# Patient Record
Sex: Female | Born: 1950
Health system: Southern US, Community
[De-identification: ages and names within clinical notes are randomized; demographics above are authoritative.]

## PROBLEM LIST (undated history)

## (undated) DIAGNOSIS — R238 Other skin changes: Secondary | ICD-10-CM

## (undated) DIAGNOSIS — Z5189 Encounter for other specified aftercare: Secondary | ICD-10-CM

## (undated) DIAGNOSIS — M719 Bursopathy, unspecified: Secondary | ICD-10-CM

## (undated) DIAGNOSIS — J029 Acute pharyngitis, unspecified: Secondary | ICD-10-CM

## (undated) DIAGNOSIS — N200 Calculus of kidney: Secondary | ICD-10-CM

## (undated) DIAGNOSIS — D219 Benign neoplasm of connective and other soft tissue, unspecified: Secondary | ICD-10-CM

## (undated) DIAGNOSIS — R233 Spontaneous ecchymoses: Secondary | ICD-10-CM

## (undated) DIAGNOSIS — R21 Rash and other nonspecific skin eruption: Secondary | ICD-10-CM

## (undated) DIAGNOSIS — D649 Anemia, unspecified: Secondary | ICD-10-CM

## (undated) DIAGNOSIS — R5383 Other fatigue: Secondary | ICD-10-CM

## (undated) DIAGNOSIS — IMO0001 Reserved for inherently not codable concepts without codable children: Secondary | ICD-10-CM

## (undated) DIAGNOSIS — IMO0002 Reserved for concepts with insufficient information to code with codable children: Secondary | ICD-10-CM

## (undated) DIAGNOSIS — G243 Spasmodic torticollis: Secondary | ICD-10-CM

## (undated) DIAGNOSIS — D496 Neoplasm of unspecified behavior of brain: Secondary | ICD-10-CM

## (undated) DIAGNOSIS — Z973 Presence of spectacles and contact lenses: Secondary | ICD-10-CM

## (undated) DIAGNOSIS — N83209 Unspecified ovarian cyst, unspecified side: Secondary | ICD-10-CM

## (undated) DIAGNOSIS — K5190 Ulcerative colitis, unspecified, without complications: Secondary | ICD-10-CM

## (undated) DIAGNOSIS — J383 Other diseases of vocal cords: Secondary | ICD-10-CM

## (undated) HISTORY — DX: Anemia, unspecified: D64.9

## (undated) HISTORY — DX: Spasmodic torticollis: G24.3

## (undated) HISTORY — DX: Ulcerative colitis, unspecified, without complications: K51.90

## (undated) HISTORY — DX: Spontaneous ecchymoses: R23.3

## (undated) HISTORY — DX: Calculus of kidney: N20.0

## (undated) HISTORY — DX: Acute pharyngitis, unspecified: J02.9

## (undated) HISTORY — DX: Benign neoplasm of connective and other soft tissue, unspecified: D21.9

## (undated) HISTORY — DX: Other fatigue: R53.83

## (undated) HISTORY — DX: Reserved for inherently not codable concepts without codable children: IMO0001

## (undated) HISTORY — DX: Presence of spectacles and contact lenses: Z97.3

## (undated) HISTORY — DX: Rash and other nonspecific skin eruption: R21

## (undated) HISTORY — DX: Reserved for concepts with insufficient information to code with codable children: IMO0002

## (undated) HISTORY — PX: OVARIAN CYST REMOVAL: SHX89

## (undated) HISTORY — DX: Bursopathy, unspecified: M71.9

## (undated) HISTORY — DX: Other skin changes: R23.8

## (undated) HISTORY — DX: Unspecified ovarian cyst, unspecified side: N83.209

## (undated) HISTORY — DX: Encounter for other specified aftercare: Z51.89

## (undated) HISTORY — DX: Other diseases of vocal cords: J38.3

## (undated) HISTORY — DX: Neoplasm of unspecified behavior of brain: D49.6

---
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
## 1979-11-28 DIAGNOSIS — N83209 Unspecified ovarian cyst, unspecified side: Secondary | ICD-10-CM

## 1979-11-28 HISTORY — DX: Unspecified ovarian cyst, unspecified side: N83.209

## 1980-11-27 DIAGNOSIS — J383 Other diseases of vocal cords: Secondary | ICD-10-CM

## 1980-11-27 DIAGNOSIS — D219 Benign neoplasm of connective and other soft tissue, unspecified: Secondary | ICD-10-CM

## 1980-11-27 HISTORY — PX: ABDOMINAL HYSTERECTOMY: SHX81

## 1980-11-27 HISTORY — DX: Other diseases of vocal cords: J38.3

## 1980-11-27 HISTORY — DX: Benign neoplasm of connective and other soft tissue, unspecified: D21.9

## 1980-11-27 HISTORY — PX: ILEOSTOMY: SHX1783

## 1993-11-27 DIAGNOSIS — D496 Neoplasm of unspecified behavior of brain: Secondary | ICD-10-CM

## 1993-11-27 HISTORY — DX: Neoplasm of unspecified behavior of brain: D49.6

## 1993-11-27 HISTORY — PX: BRAIN TUMOR EXCISION: SHX577

## 1998-11-27 HISTORY — PX: LARYNX SURGERY: SHX692

## 2005-11-27 DIAGNOSIS — G243 Spasmodic torticollis: Secondary | ICD-10-CM

## 2005-11-27 HISTORY — DX: Spasmodic torticollis: G24.3

## 2010-02-16 ENCOUNTER — Encounter: Admission: RE | Admit: 2010-02-16 | Discharge: 2010-02-16 | Payer: Self-pay | Admitting: Family Medicine

## 2010-02-23 ENCOUNTER — Encounter: Admission: RE | Admit: 2010-02-23 | Discharge: 2010-02-23 | Payer: Self-pay | Admitting: Family Medicine

## 2011-01-20 ENCOUNTER — Other Ambulatory Visit: Payer: Self-pay | Admitting: Family Medicine

## 2011-01-20 DIAGNOSIS — E041 Nontoxic single thyroid nodule: Secondary | ICD-10-CM

## 2011-01-24 ENCOUNTER — Ambulatory Visit
Admission: RE | Admit: 2011-01-24 | Discharge: 2011-01-24 | Disposition: A | Discharge status: Home / Self Care | Payer: BC Managed Care – PPO | Source: Ambulatory Visit | Attending: Family Medicine | Admitting: Family Medicine

## 2011-01-24 DIAGNOSIS — E041 Nontoxic single thyroid nodule: Secondary | ICD-10-CM

## 2011-02-01 ENCOUNTER — Other Ambulatory Visit: Payer: Self-pay | Admitting: Family Medicine

## 2011-02-01 DIAGNOSIS — E041 Nontoxic single thyroid nodule: Secondary | ICD-10-CM

## 2011-02-14 ENCOUNTER — Ambulatory Visit
Admission: RE | Admit: 2011-02-14 | Discharge: 2011-02-14 | Disposition: A | Discharge status: Home / Self Care | Payer: BC Managed Care – PPO | Source: Ambulatory Visit | Attending: Family Medicine | Admitting: Family Medicine

## 2011-02-14 ENCOUNTER — Inpatient Hospital Stay
Admission: RE | Admit: 2011-02-14 | Discharge: 2011-02-14 | Payer: BC Managed Care – PPO | Source: Ambulatory Visit | Attending: Family Medicine | Admitting: Family Medicine

## 2011-02-14 ENCOUNTER — Other Ambulatory Visit: Payer: Self-pay | Admitting: Family Medicine

## 2011-02-14 ENCOUNTER — Other Ambulatory Visit (HOSPITAL_COMMUNITY)
Admission: RE | Admit: 2011-02-14 | Discharge: 2011-02-14 | Disposition: A | Discharge status: Home / Self Care | Payer: BC Managed Care – PPO | Source: Ambulatory Visit | Attending: Interventional Radiology | Admitting: Interventional Radiology

## 2011-02-14 ENCOUNTER — Other Ambulatory Visit: Payer: Self-pay | Admitting: Interventional Radiology

## 2011-02-14 DIAGNOSIS — E049 Nontoxic goiter, unspecified: Secondary | ICD-10-CM | POA: Insufficient documentation

## 2011-02-14 DIAGNOSIS — E041 Nontoxic single thyroid nodule: Secondary | ICD-10-CM

## 2011-03-07 IMAGING — US US ABDOMEN COMPLETE
1 series · 14 of 25 positions shown · non-contrast
Comparison: 02/16/2010

CLINICAL DATA: Abnormal right abdominal calcification by plain
radiography concerning for gallstones or renal calculi.

ABDOMINAL ULTRASOUND COMPLETE

[Series 1: us abdomen complete · 0.23mm/px · 14 of 63 slices shown]
[im 1/63]
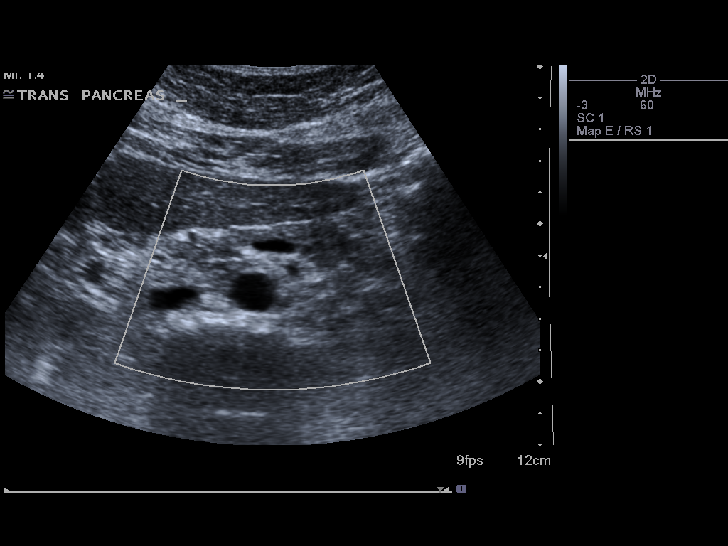
[im 6/63]
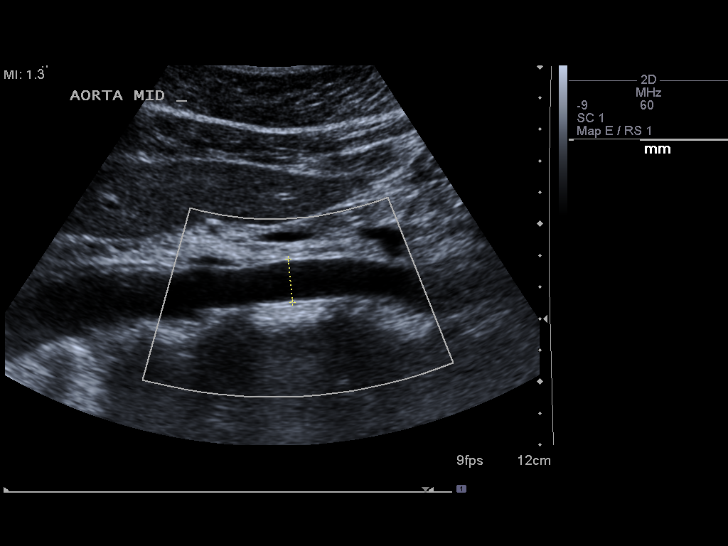
[im 11/63]
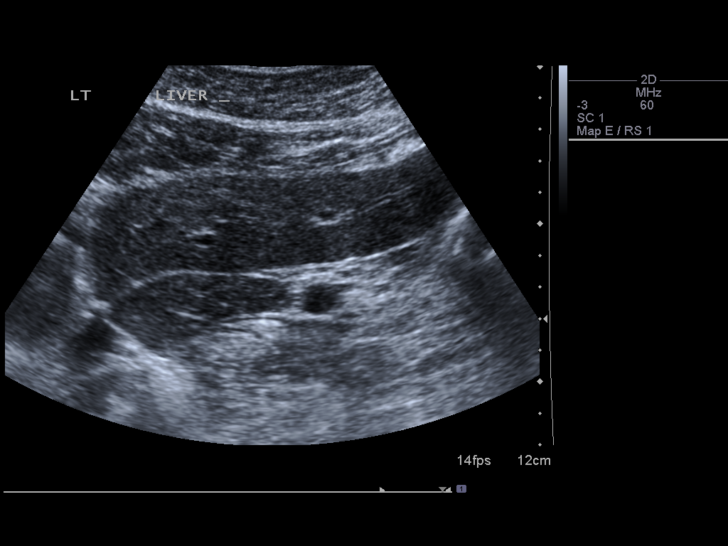
[im 16/63]
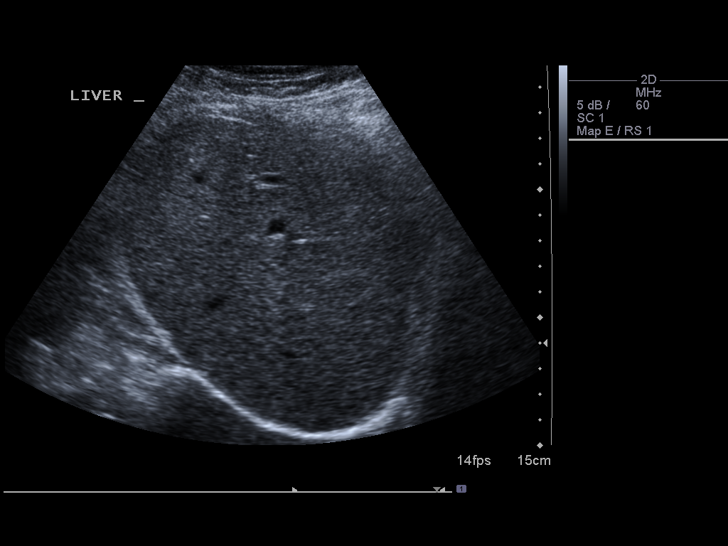
[im 21/63]
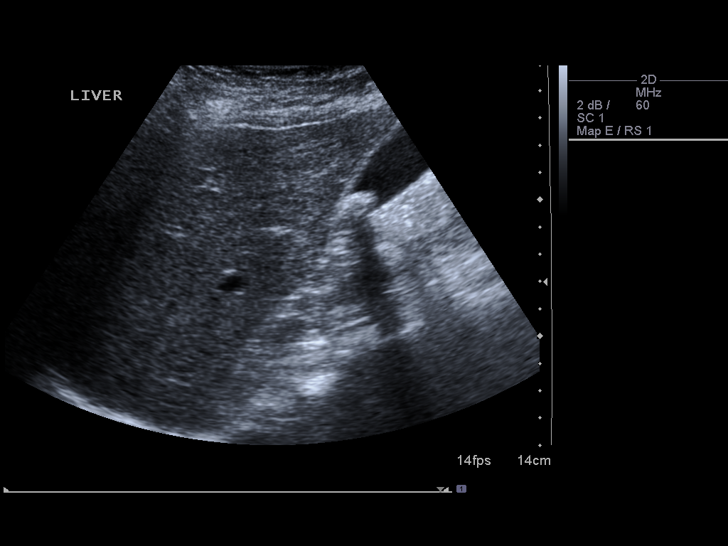
[im 24/63]
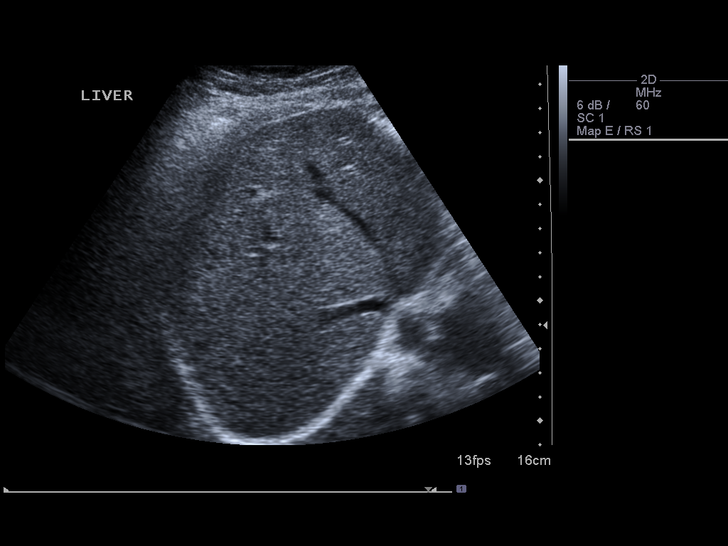
[im 29/63]
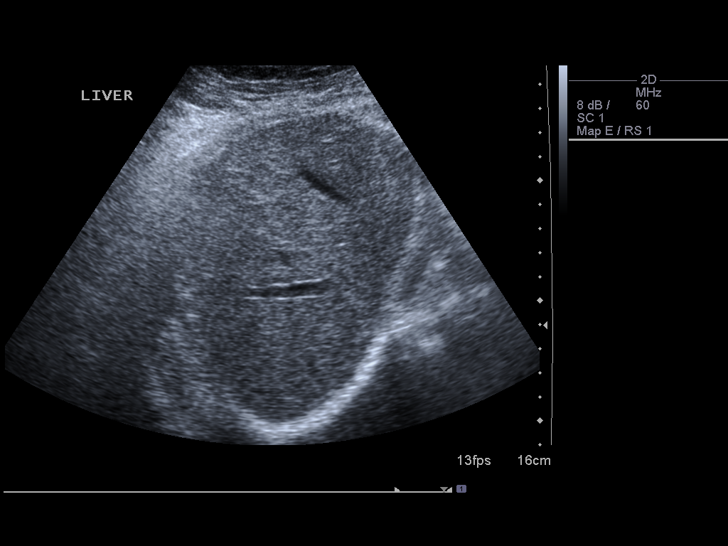
[im 34/63]
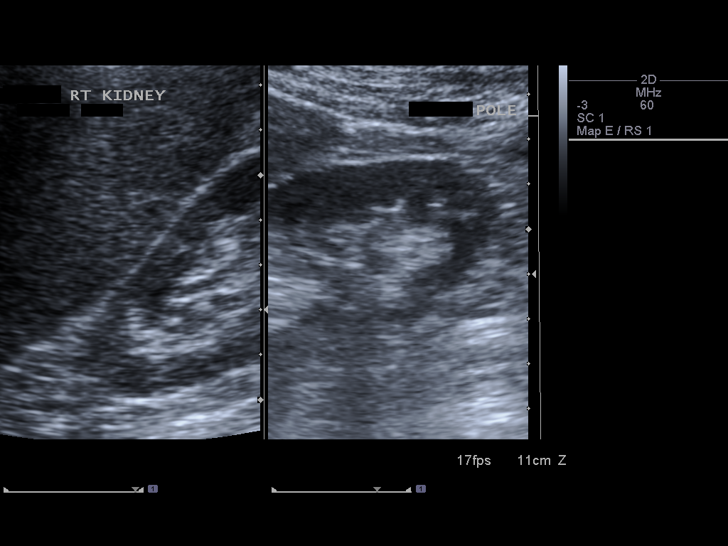
[im 39/63]
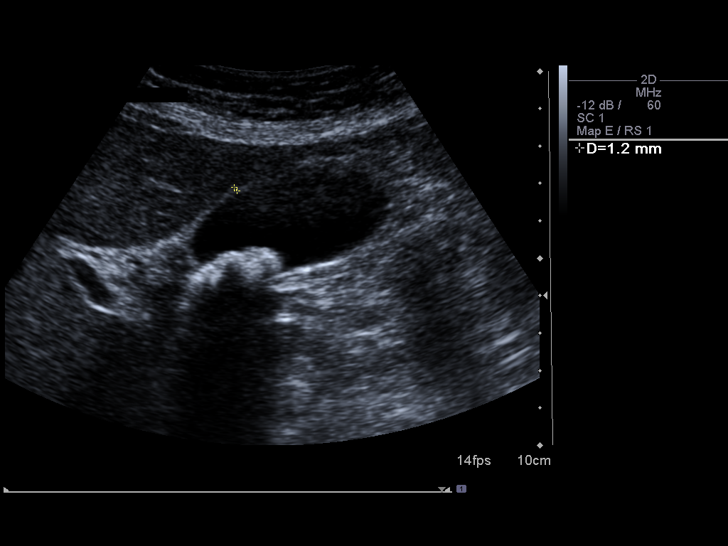
[im 42/63]
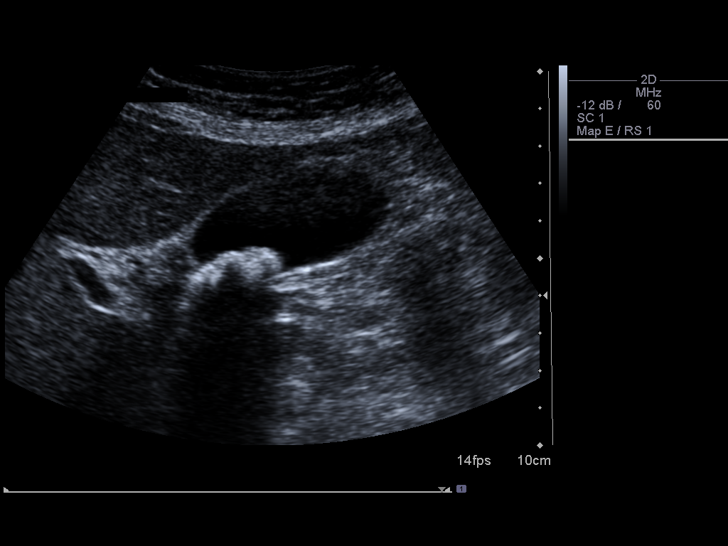
[im 47/63]
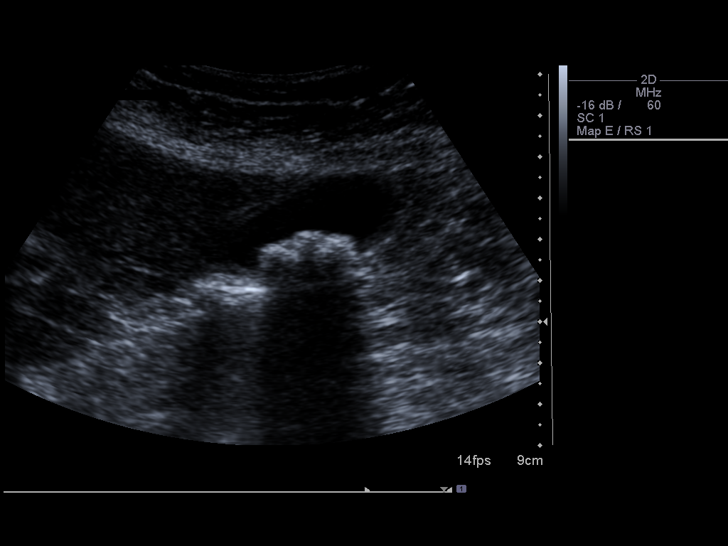
[im 52/63]
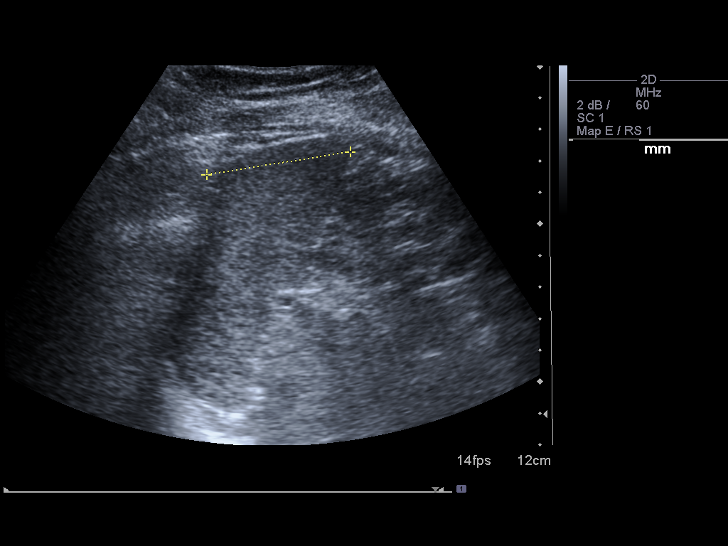
[im 57/63]
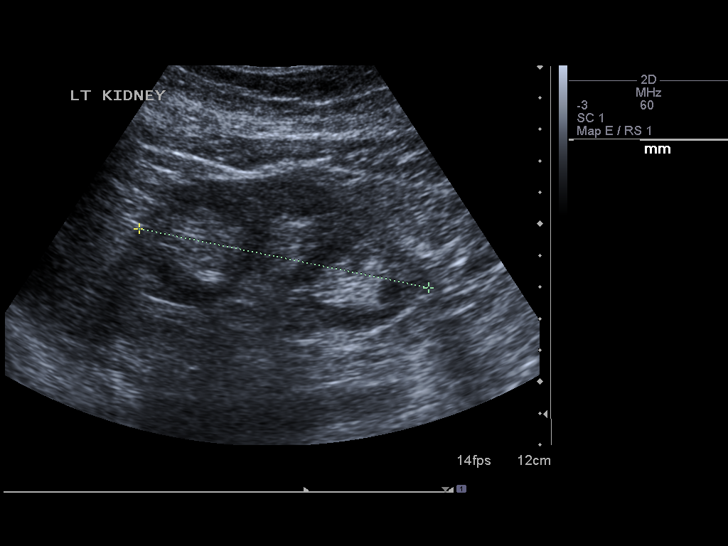
[im 63/63]
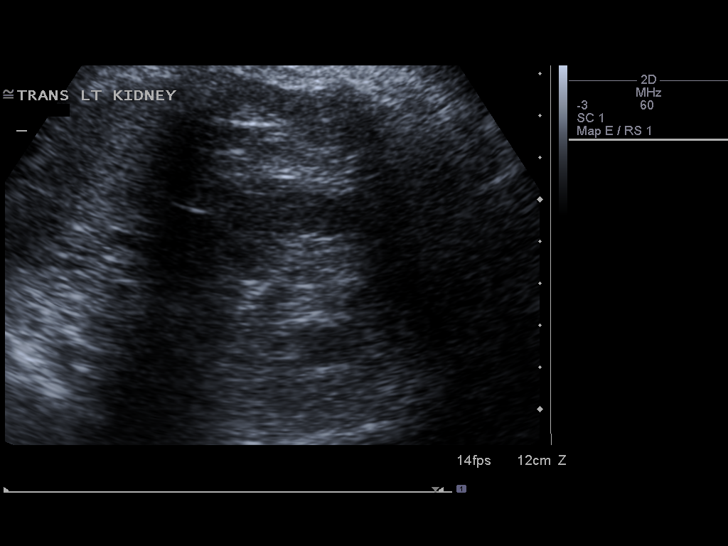

[14 of 25 positions shown; findings below may reference images not displayed]

FINDINGS: Gallbladder:  Several echogenic calcified shadowing gallstones
noted.  Largest measures 2.3 cm.  Gallstones appear mobile.  This
correlates with the calcifications in the right abdomen by plain
radiography.  No wall thickening, pericholecystic fluid, or
Murphy's sign.  Wall thickness measures 1.2 mm.

Common Bile Duct:  Within normal limits in caliber. Nondilated,
mm diameter.

Liver:  No focal lesion identified.  Within normal limits in
parenchymal echogenicity.

IVC:  Appears normal.

Pancreas:  Although the pancreas is difficult to visualize in its
entirety, no focal pancreatic abnormality is identified.

Spleen:  Within normal limits in size and echotexture.

Right kidney:  Normal in size and parenchymal echogenicity.  No
evidence of mass or hydronephrosis.

Left kidney:  Normal in size and parenchymal echogenicity.  No
evidence of mass or hydronephrosis.

Abdominal Aorta:  No aneurysm identified.
IMPRESSION: Cholelithiasis

## 2011-03-15 ENCOUNTER — Other Ambulatory Visit: Payer: Self-pay | Admitting: Surgery

## 2011-03-15 DIAGNOSIS — E041 Nontoxic single thyroid nodule: Secondary | ICD-10-CM

## 2011-03-29 ENCOUNTER — Encounter (INDEPENDENT_AMBULATORY_CARE_PROVIDER_SITE_OTHER): Payer: Self-pay | Admitting: Surgery

## 2011-09-25 ENCOUNTER — Ambulatory Visit
Admission: RE | Admit: 2011-09-25 | Discharge: 2011-09-25 | Disposition: A | Discharge status: Home / Self Care | Payer: BC Managed Care – PPO | Source: Ambulatory Visit | Attending: Surgery | Admitting: Surgery

## 2011-09-25 DIAGNOSIS — E041 Nontoxic single thyroid nodule: Secondary | ICD-10-CM

## 2011-09-27 ENCOUNTER — Telehealth (INDEPENDENT_AMBULATORY_CARE_PROVIDER_SITE_OTHER): Payer: Self-pay

## 2011-09-27 NOTE — Telephone Encounter (Signed)
Patient needs to schedule appointment for physical exam in the office.

## 2011-10-16 ENCOUNTER — Encounter (INDEPENDENT_AMBULATORY_CARE_PROVIDER_SITE_OTHER): Payer: Self-pay | Admitting: Surgery

## 2011-10-17 ENCOUNTER — Ambulatory Visit (INDEPENDENT_AMBULATORY_CARE_PROVIDER_SITE_OTHER): Payer: BC Managed Care – PPO | Admitting: Surgery

## 2011-10-17 ENCOUNTER — Encounter (INDEPENDENT_AMBULATORY_CARE_PROVIDER_SITE_OTHER): Payer: Self-pay | Admitting: Surgery

## 2011-10-17 VITALS — BP 132/76 | HR 70 | Temp 96.8°F | Resp 16 | Ht 61.5 in | Wt 138.0 lb

## 2011-10-17 DIAGNOSIS — E041 Nontoxic single thyroid nodule: Secondary | ICD-10-CM | POA: Insufficient documentation

## 2011-10-17 NOTE — Progress Notes (Signed)
Visit Diagnoses: 1. Thyroid nodule, uninodular, left     HISTORY: Patient is a 60 year old white female followed for left thyroid nodule. This was an incidental finding on MRI scan in the winter of 2012.  Subsequent thyroid ultrasound was performed February 2012. This showed a normal sized thyroid gland with a solitary left thyroid nodule measuring 1.8 cm. Ultrasound-guided fine-needle aspiration biopsy was performed with benign cytopathology. Patient has a normal TSH level of 0.90.  Patient returns today for followup. A repeat ultrasound of the thyroid was performed on September 25, 2011. This shows the thyroid gland to be stable in size. The dominant nodule on the left is stable at 1.8 cm. No significant changes are identified.   PERTINENT REVIEW OF SYSTEMS: Patient notes occasional cough. She has some seasonal allergies. She denies any new masses. She denies any pain.   EXAM: HEENT: normocephalic; pupils equal and reactive; sclerae clear; dentition good; mucous membranes moist NECK:  No palpable thyroid nodules; symmetric on extension; no palpable anterior or posterior cervical lymphadenopathy; no supraclavicular masses; no tenderness CHEST: clear to auscultation bilaterally without rales, rhonchi, or wheezes CARDIAC: regular rate and rhythm without significant murmur; peripheral pulses are full EXT:  non-tender without edema; no deformity NEURO: no gross focal deficits; no sign of tremor   IMPRESSION: Left thyroid nodule, complex, 1.8 cm, with benign cytopathology   PLAN: The patient and I reviewed the recent ultrasound results. I have recommended that she followup in our practice in one year. We will repeat a thyroid ultrasound prior to that office visit. Her primary physician will continue to monitor her TSH level.   Velora Heckler, MD, FACS General & Endocrine Surgery [facility]

## 2011-10-26 ENCOUNTER — Encounter (INDEPENDENT_AMBULATORY_CARE_PROVIDER_SITE_OTHER): Payer: Self-pay | Admitting: Surgery

## 2012-02-05 IMAGING — US US SOFT TISSUE HEAD/NECK
1 series · 14 of 25 positions shown · non-contrast
Comparison: None.

CLINICAL DATA: Thyroid nodule discovered on outside MRI.

THYROID ULTRASOUND
TECHNIQUE: Ultrasound examination of the thyroid gland and adjacent
soft tissues was performed.

[Series 1: us soft tissue head/neck · 0.07mm/px · 14 of 37 slices shown]
[im 1/37]
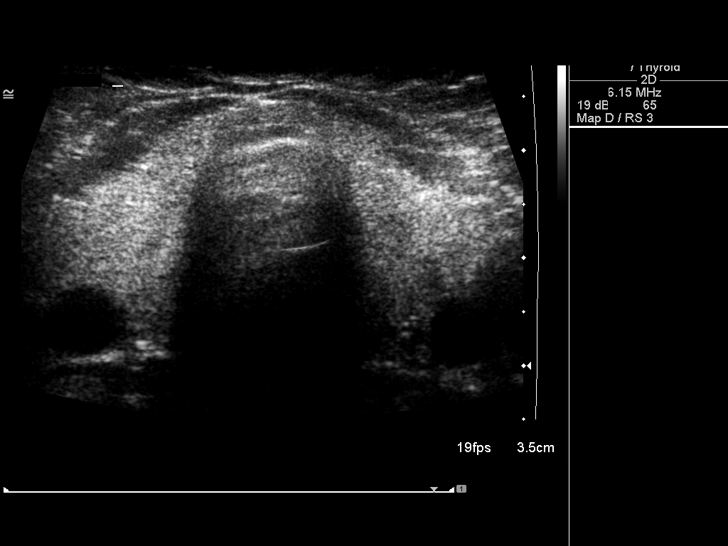
[im 4/37]
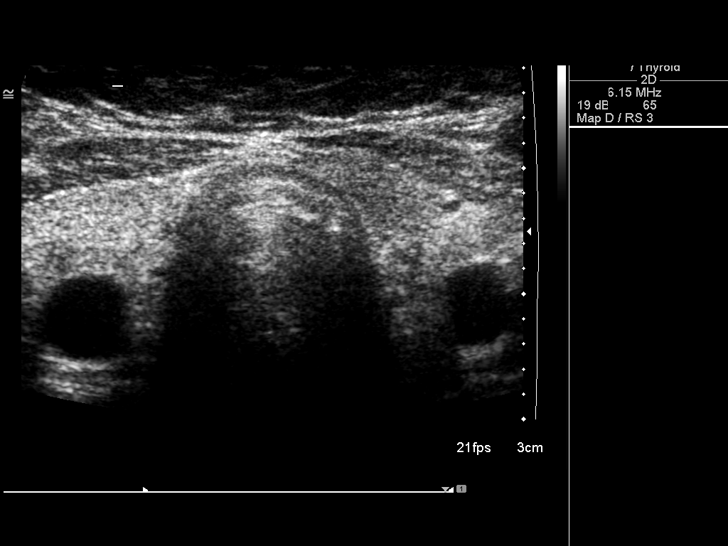
[im 7/37]
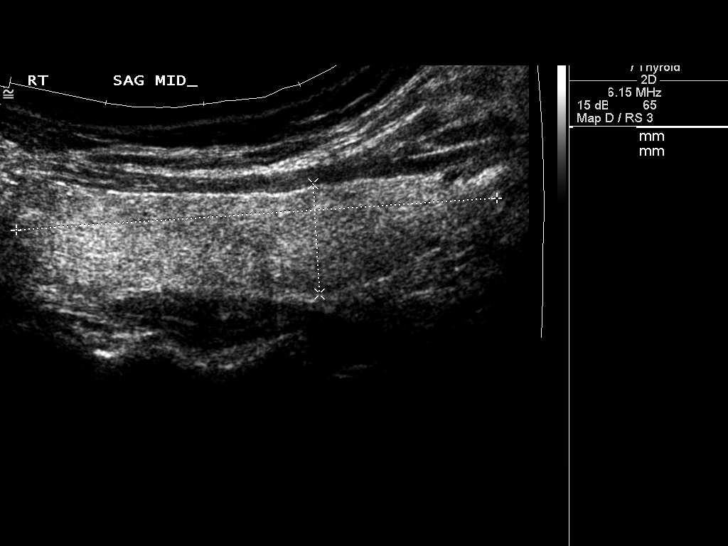
[im 10/37]
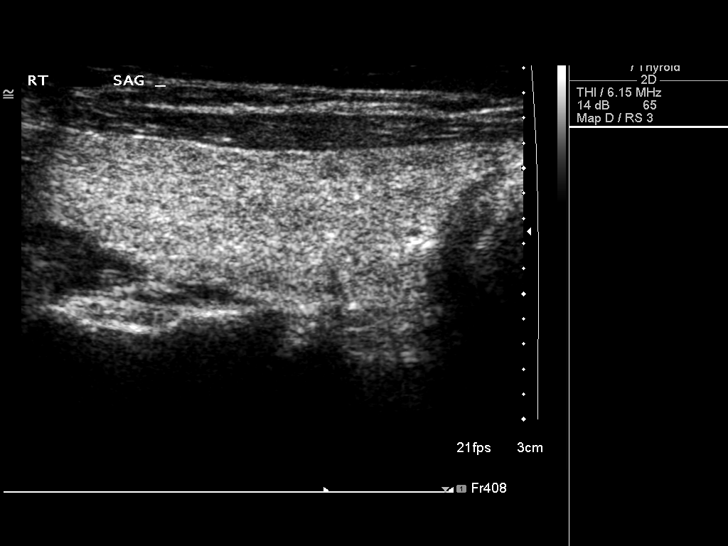
[im 13/37]
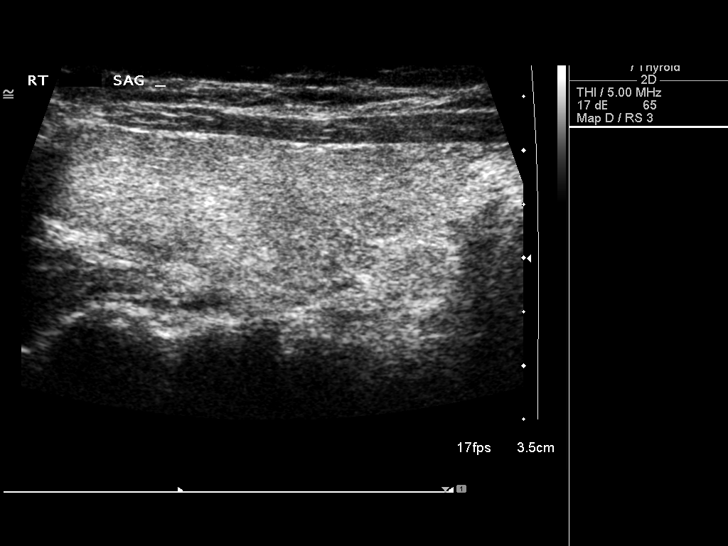
[im 14/37]
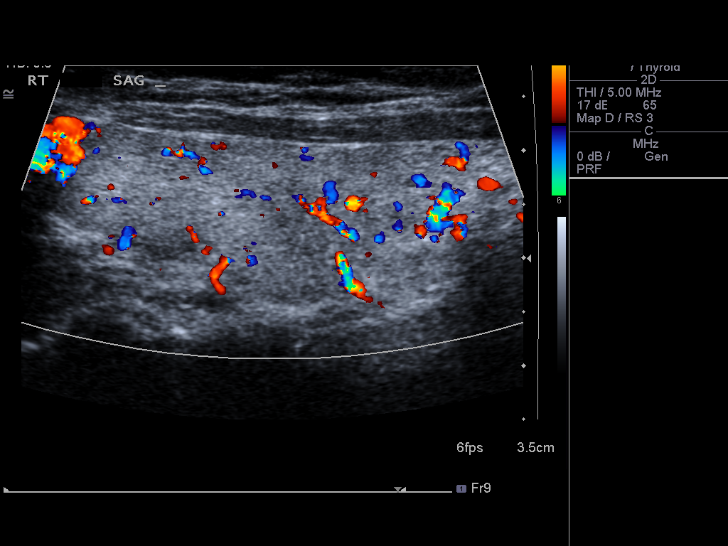
[im 17/37]
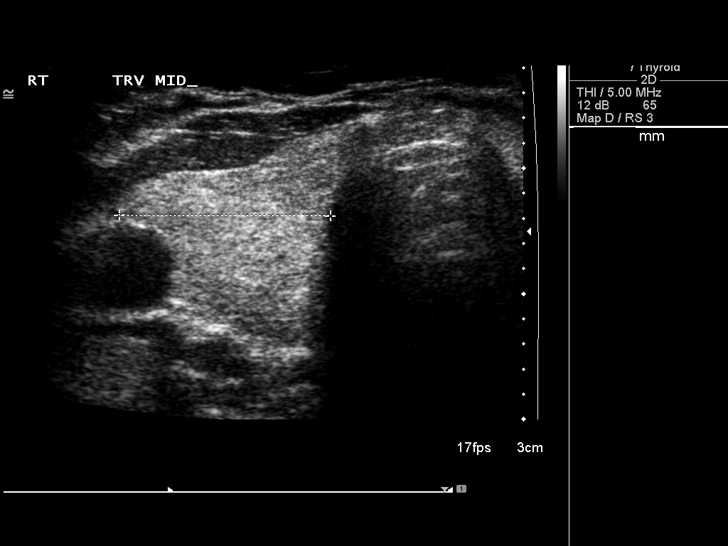
[im 20/37]
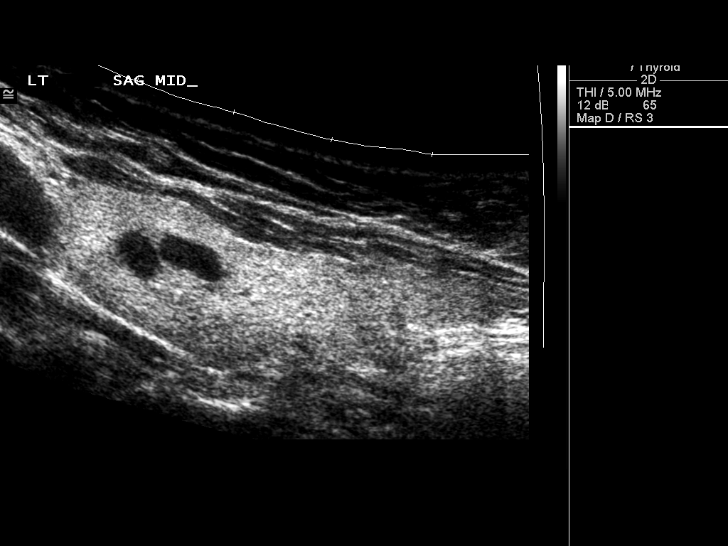
[im 23/37]
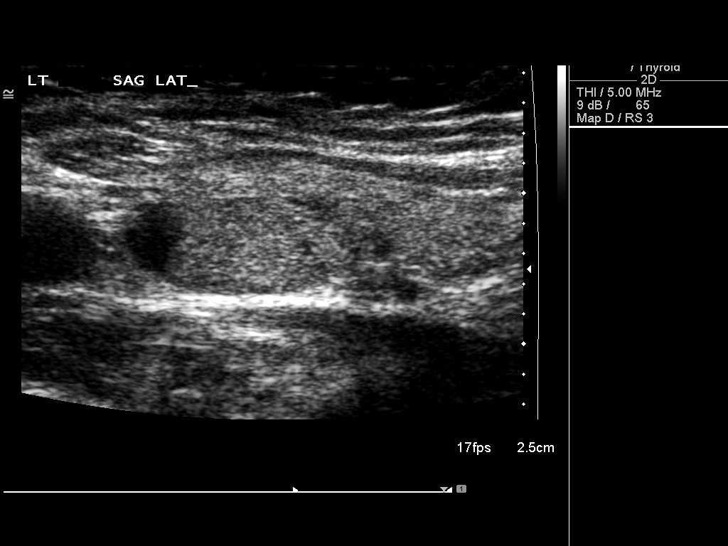
[im 25/37]
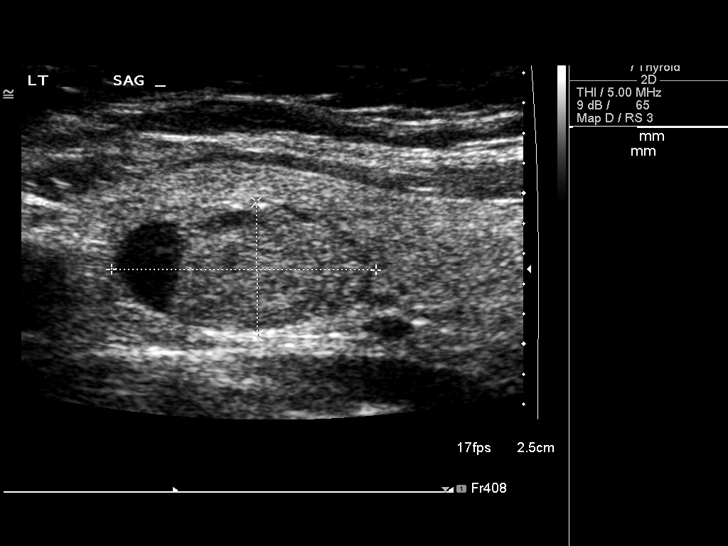
[im 28/37]
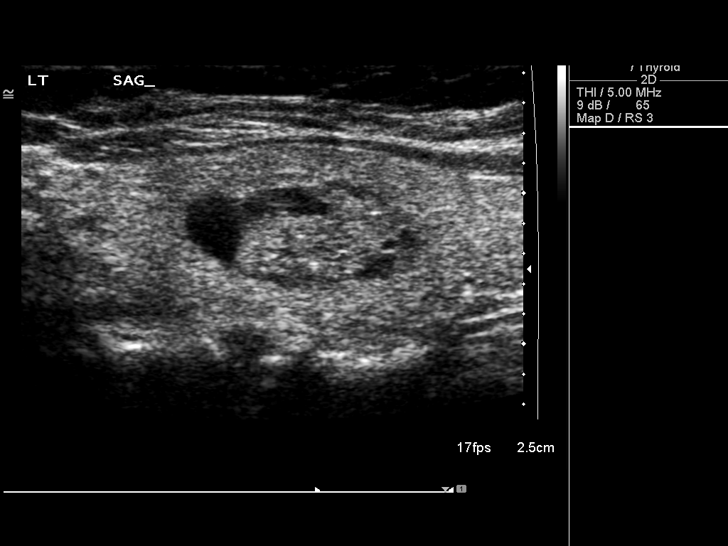
[im 31/37]
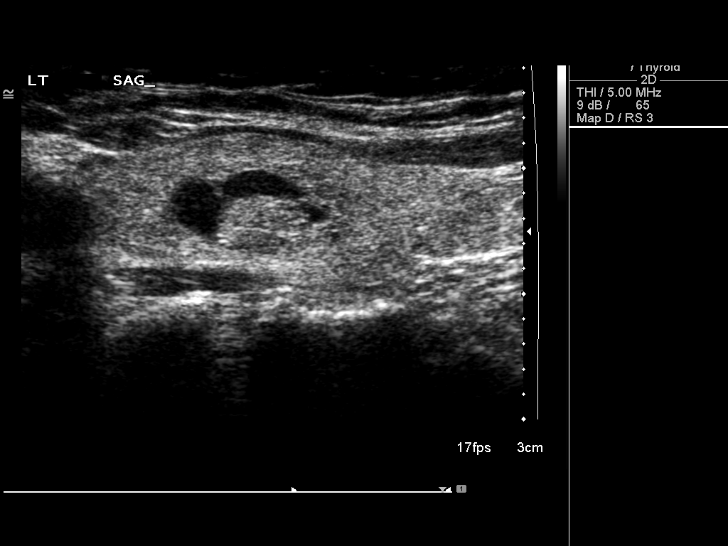
[im 34/37]
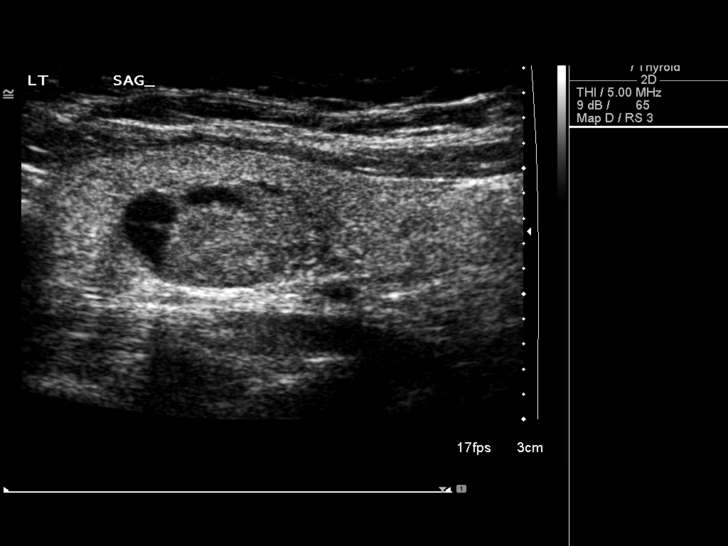
[im 37/37]
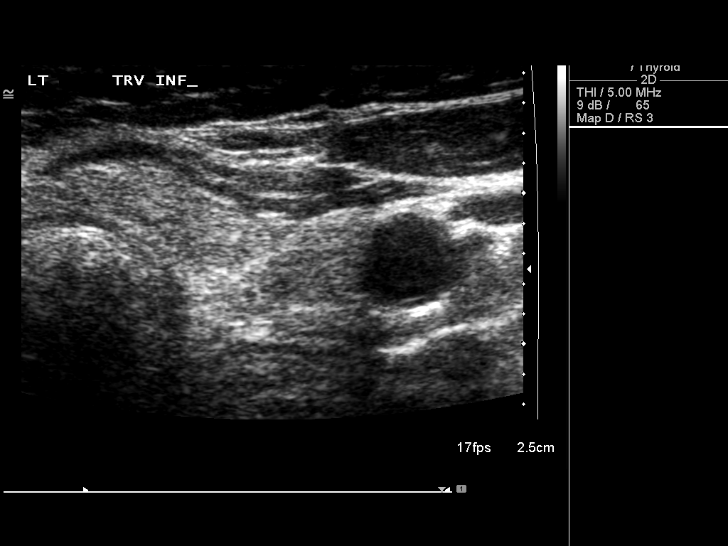

[14 of 25 positions shown; findings below may reference images not displayed]

FINDINGS: Right thyroid lobe:  4.9 x 1.1 x 1.7 cm.
Left thyroid lobe:  4.8 x 1.4 x 1.9 cm.
Isthmus:  0.2 cm in thickness.

Focal nodules:  The gland is minimally heterogeneous in
echotexture.  There is a single complex solid and cystic lesion in
the mid left lobe which measures 1.8 x 0.9 x 1.1 cm.  There is
blood flow within the solid components.  There is a less than 3 mm
hypoechoic nodule inferiorly on the right.  No other nodules are
identified.

Lymphadenopathy:  None visualized.
IMPRESSION: 1.  Complex mixed solid and cystic lesion in the mid left thyroid
lobe.
2.  No other nodules identified.
3.  Fine-needle aspiration of the left thyroid nodule is
recommended to exclude malignancy.

## 2012-02-26 IMAGING — US US THYROID BIOPSY
1 series · 12 of 12 positions shown · non-contrast
Comparison: 01/24/2011

CLINICAL DATA: Dominant left thyroid nodule

ULTRASOUND GUIDED NEEDLE ASPIRATE BIOPSY OF THE THYROID GLAND

[Series 1: us thyroid biopsy · 0.05mm/px · 12 acquisitions, 12 frames shown]
[im 1/12]
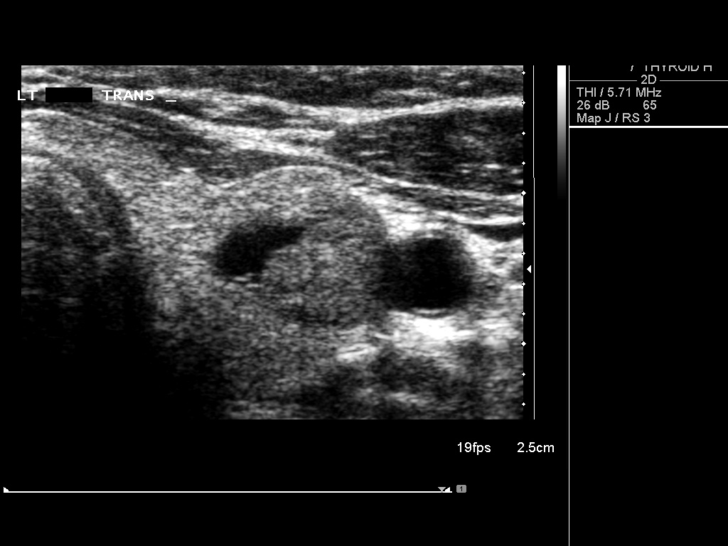
[im 2/12]
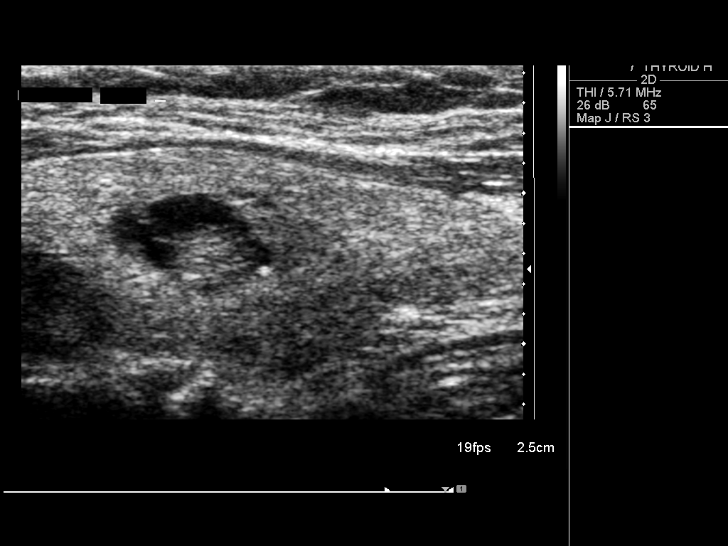
[im 3/12]
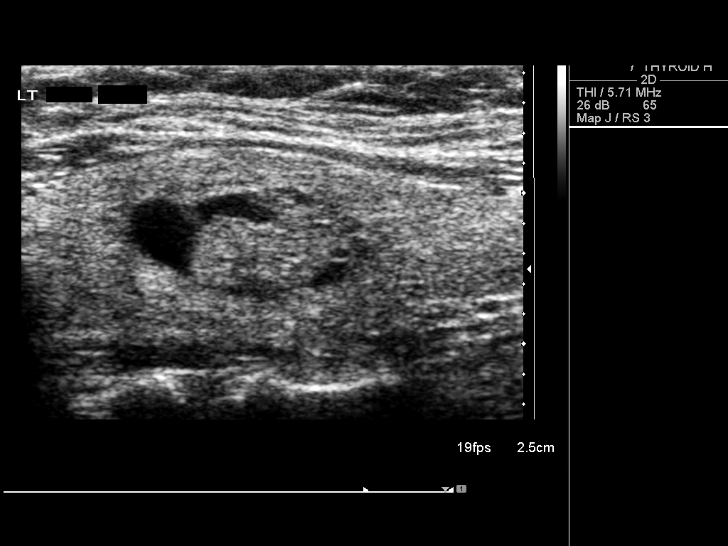
[im 4/12]
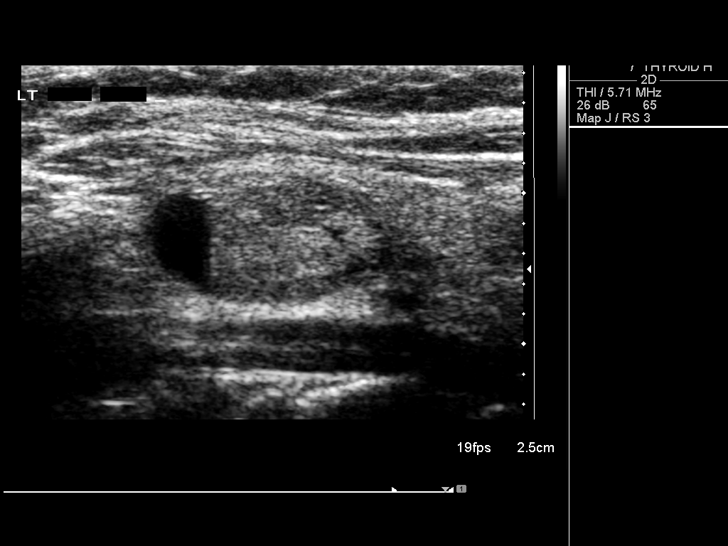
[im 5/12]
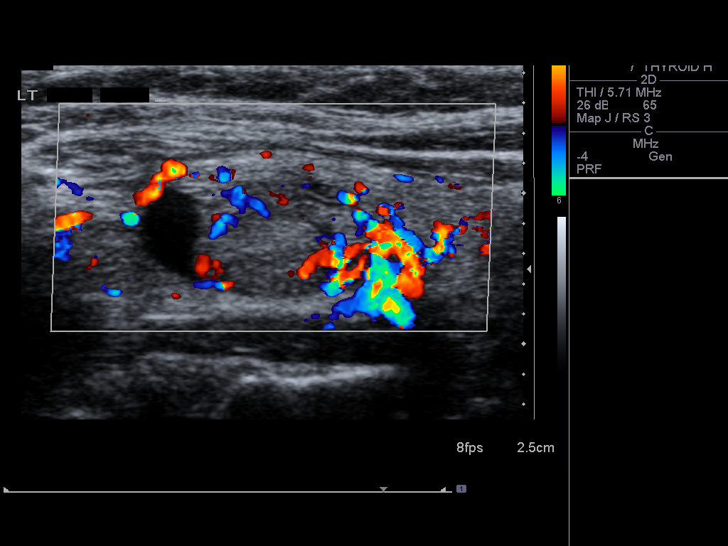
[im 6/12]
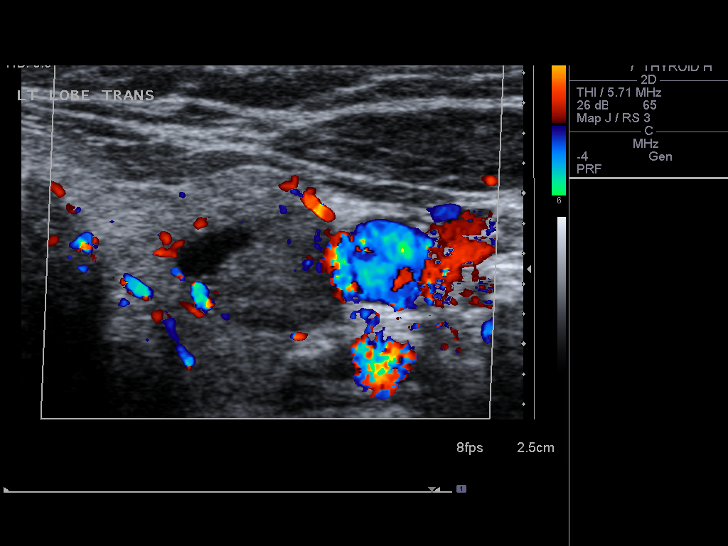
[im 7/12]
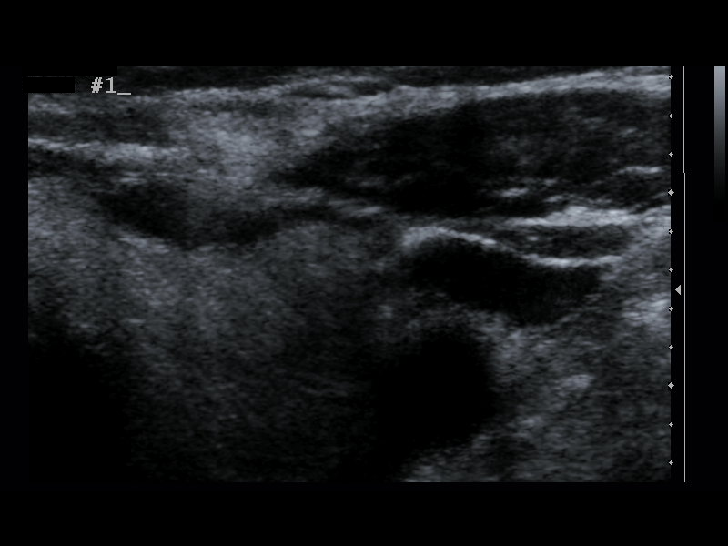
[im 8/12]
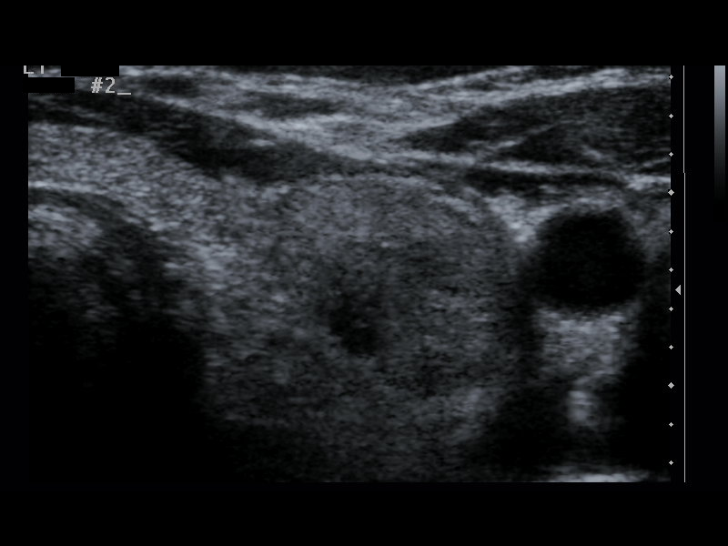
[im 9/12]
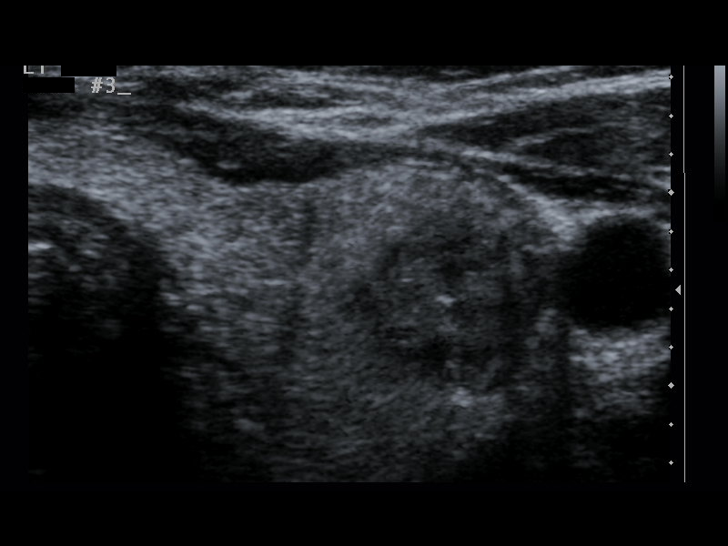
[im 10/12]
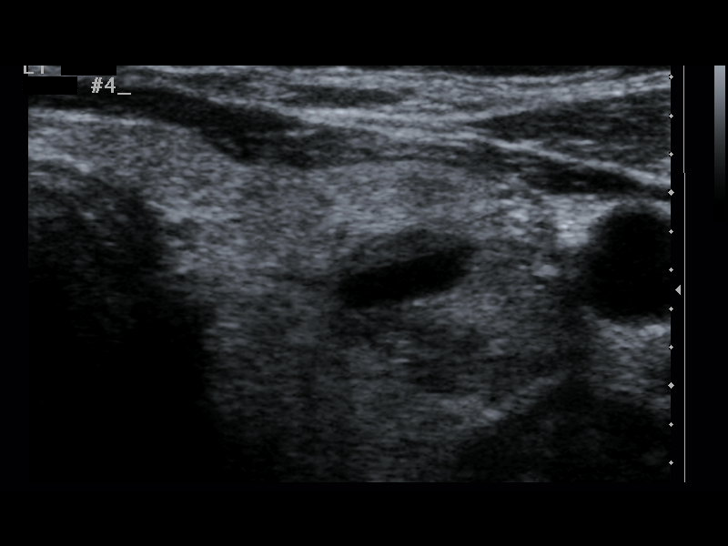
[im 11/12]
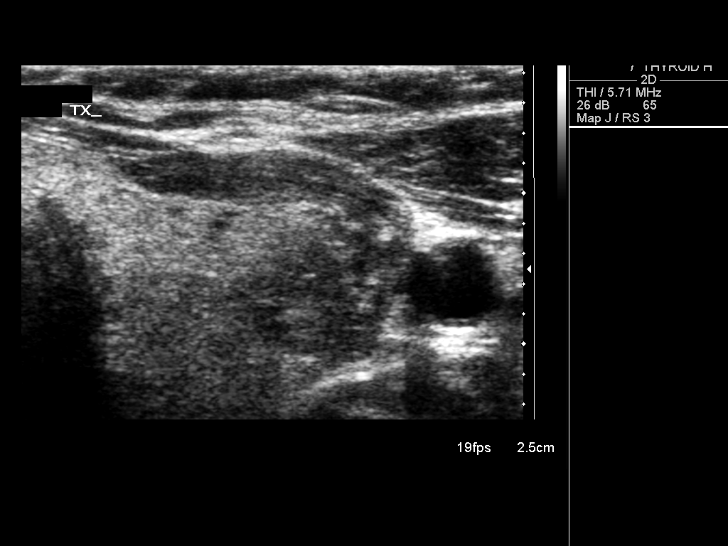
[im 12/12]
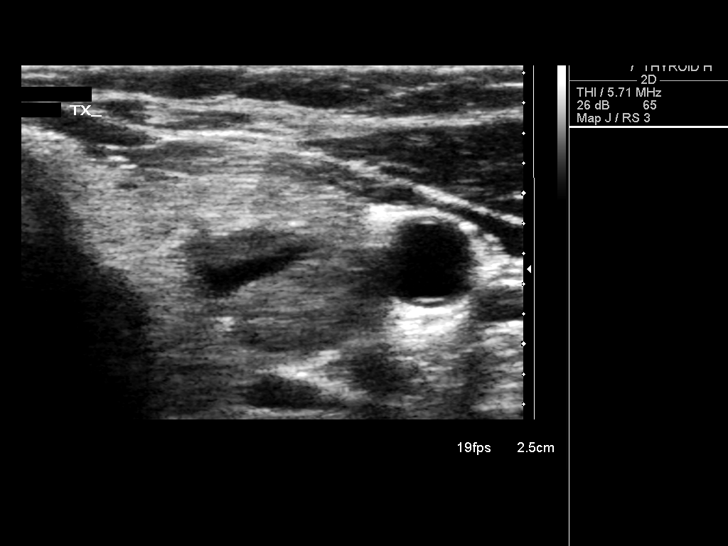

[12 of 12 positions shown; findings below may reference images not displayed]

Thyroid biopsy was thoroughly discussed with the patient and
questions were answered.  The benefits, risks, alternatives, and
complications were also discussed.  The patient understands and
wishes to proceed with the procedure.  Written consent was
obtained.

Ultrasound was performed to localize and mark an adequate site for
the biopsy.  The patient was then prepped and draped in a normal
sterile fashion.  Local anesthesia was provided with 1% lidocaine.
Using direct ultrasound guidance, 4 passes were made using 25 gauge
needles into the nodule within the left lobe of the thyroid.
Ultrasound was used to confirm needle placements on all occasions.
Specimens were sent to Pathology for analysis.

Complications:  No immediate
FINDINGS: Imaging confirms needle placement in the dominant left
thyroid nodule
IMPRESSION: Ultrasound guided needle aspirate biopsy performed of the dominant
left thyroid nodule.

## 2012-09-19 ENCOUNTER — Telehealth (INDEPENDENT_AMBULATORY_CARE_PROVIDER_SITE_OTHER): Payer: Self-pay

## 2012-09-19 ENCOUNTER — Encounter (INDEPENDENT_AMBULATORY_CARE_PROVIDER_SITE_OTHER): Payer: Self-pay

## 2012-09-19 NOTE — Telephone Encounter (Signed)
Recall letter mailed to pt. Pt due for thyroid ultrasound and ov with Dr Gerrit Friends.

## 2012-10-06 IMAGING — US US SOFT TISSUE HEAD/NECK
1 series · 14 of 25 positions shown · non-contrast
Comparison: 01/24/2011
Correlation:  Thyroid biopsy 02/14/2011

CLINICAL DATA: Follow up left thyroid nodule

THYROID ULTRASOUND
TECHNIQUE: Ultrasound examination of the thyroid gland and adjacent
soft tissues was performed.

[Series 1: us soft tissue head/neck · 0.06mm/px · 14 of 34 slices shown]
[im 1/34]
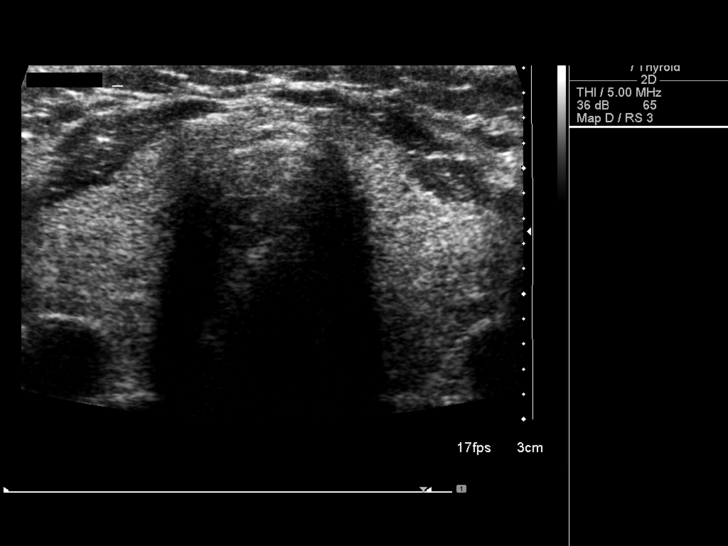
[im 3/34]
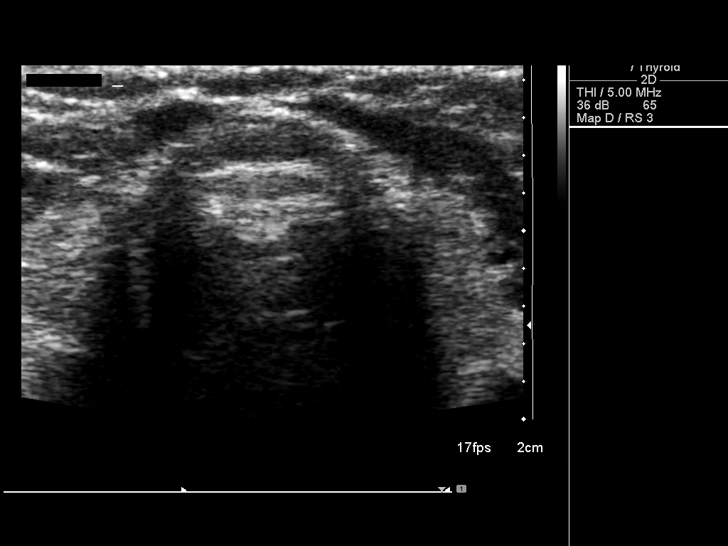
[im 6/34]
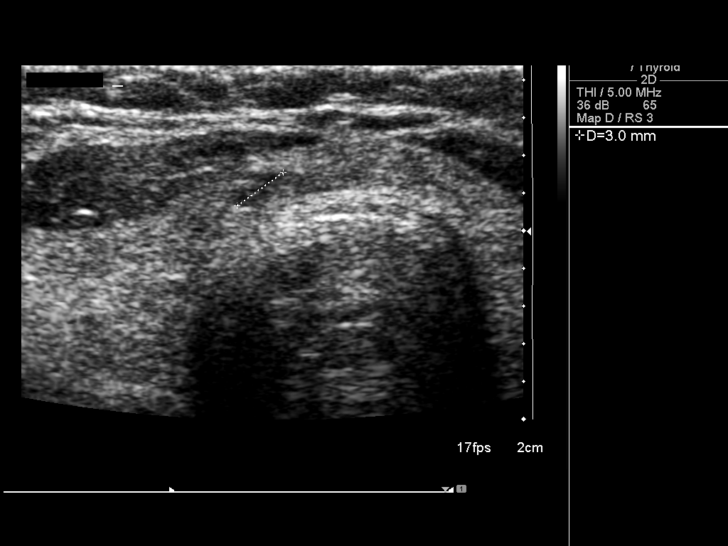
[im 9/34]
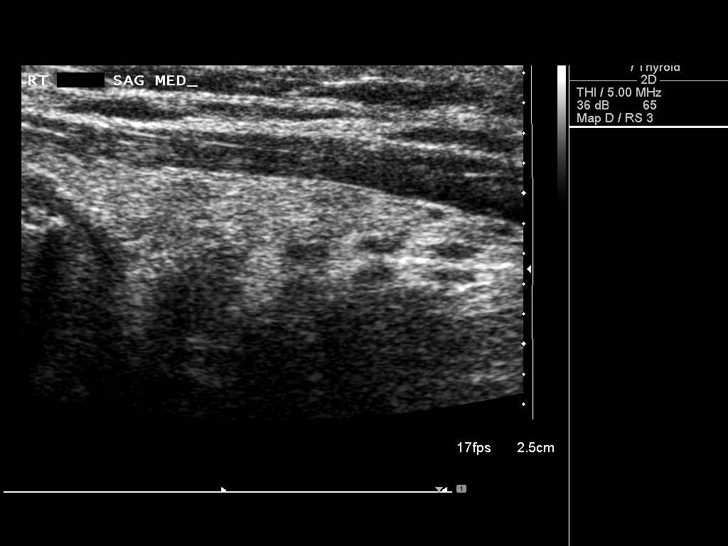
[im 12/34]
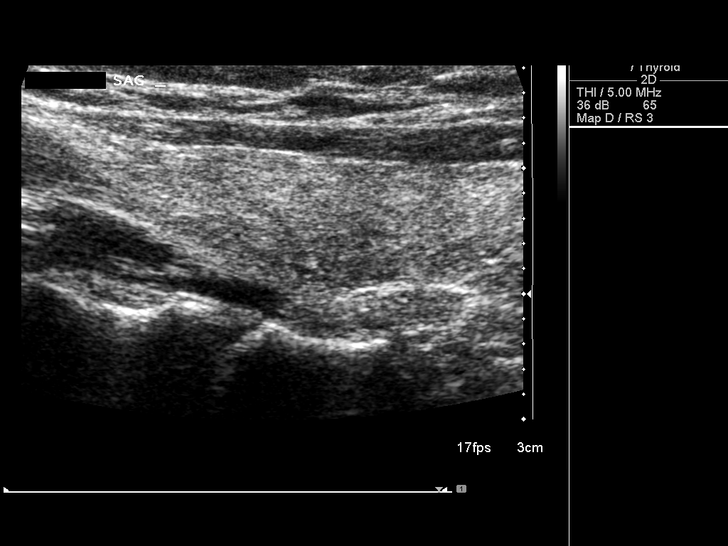
[im 13/34]
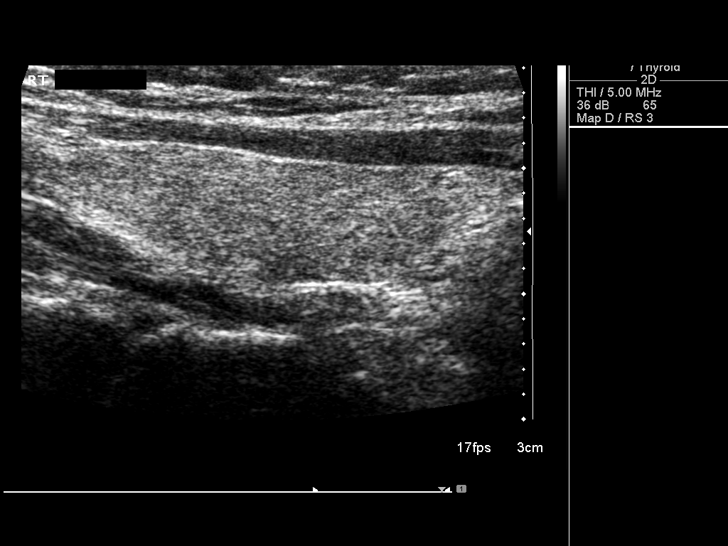
[im 16/34]
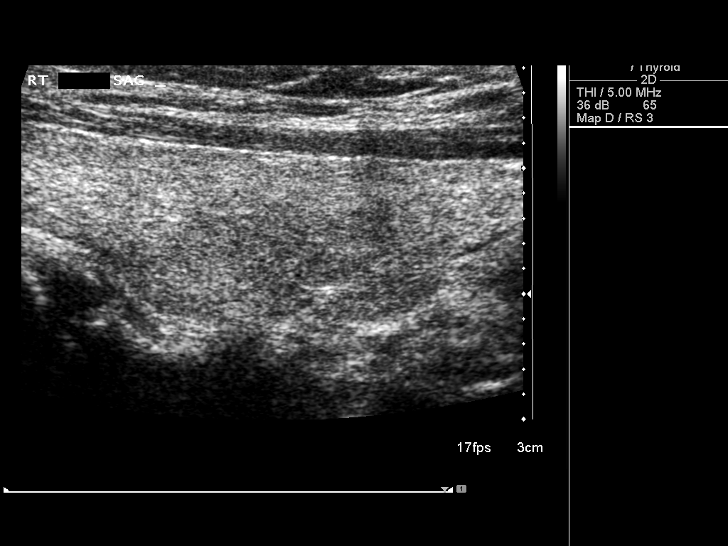
[im 18/34]
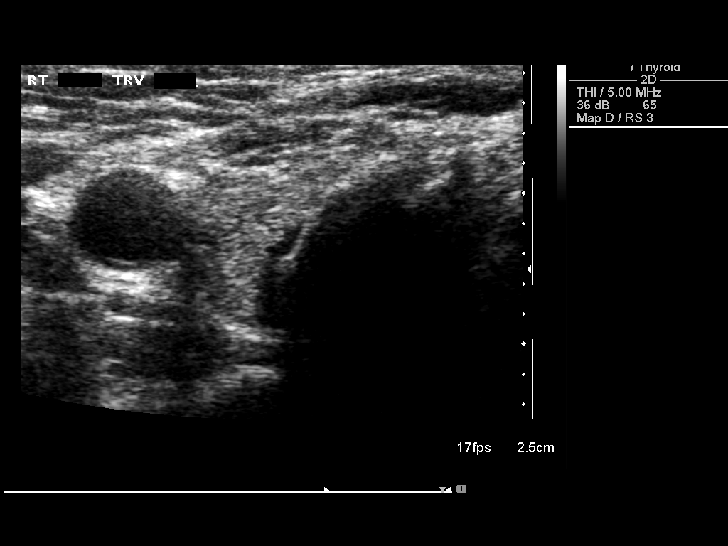
[im 21/34]
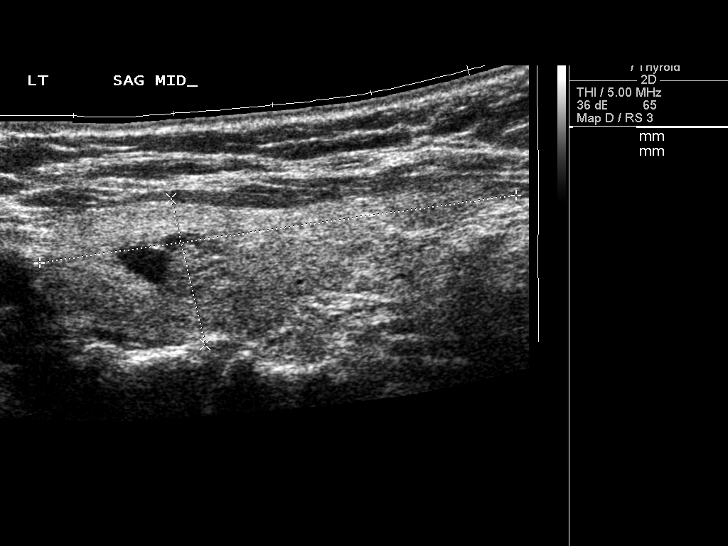
[im 23/34]
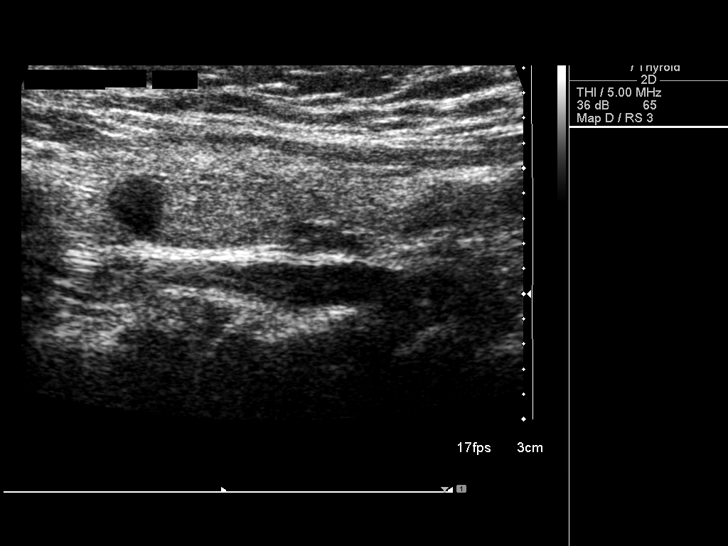
[im 25/34]
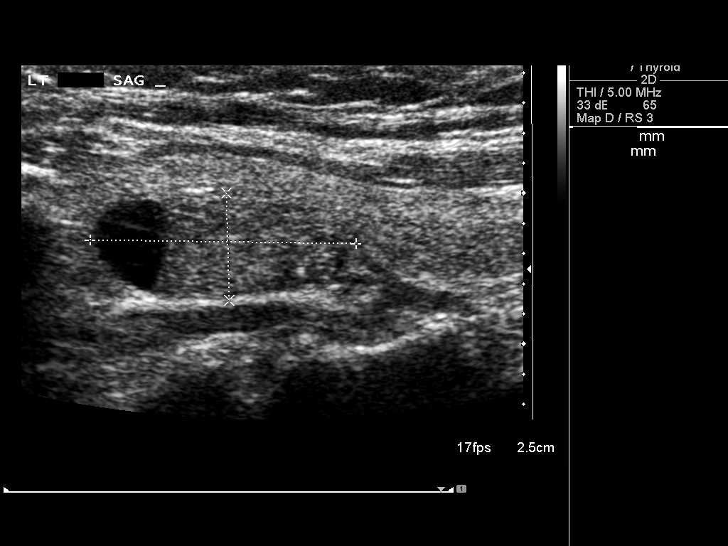
[im 28/34]
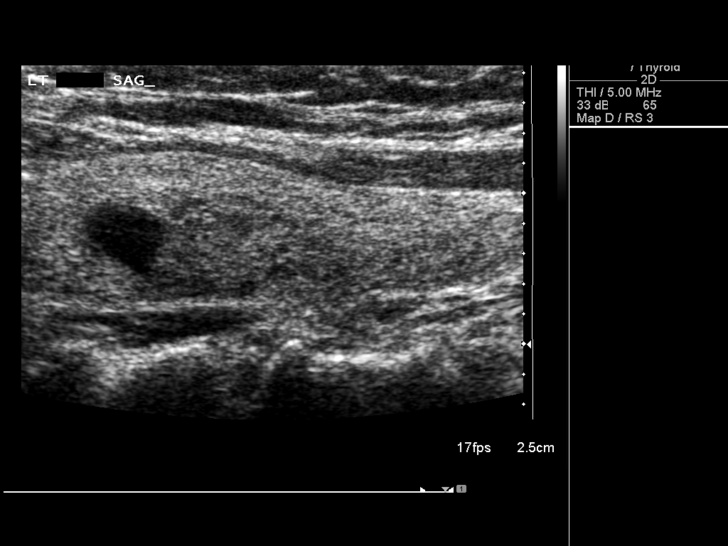
[im 31/34]
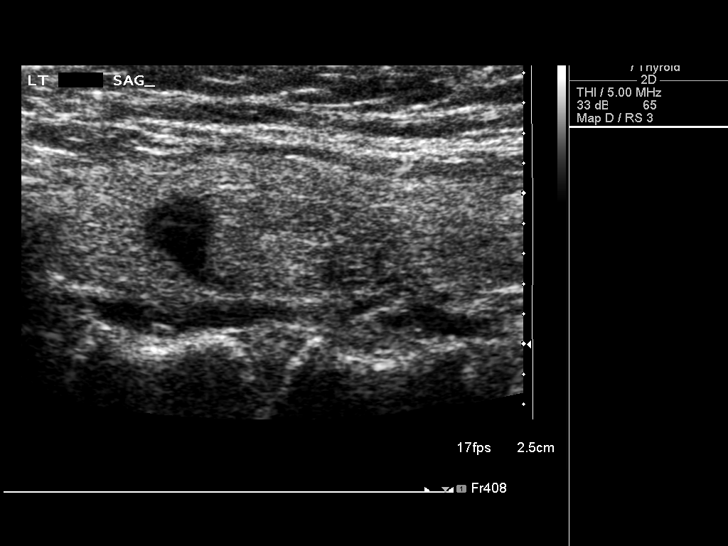
[im 34/34]
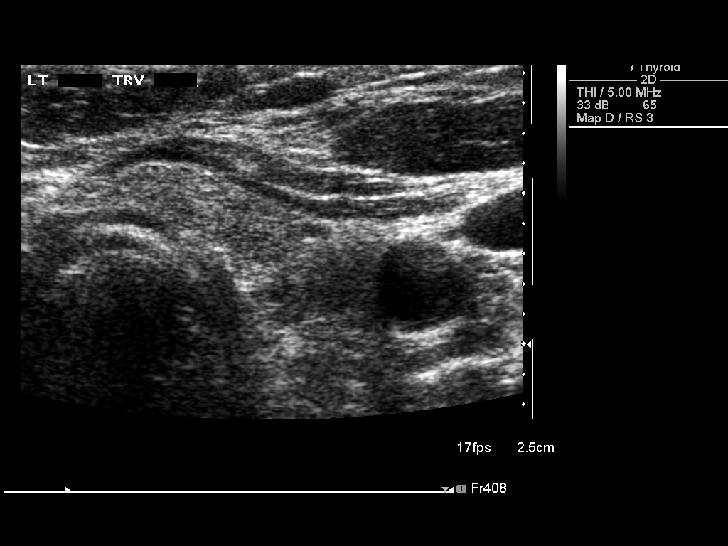

[14 of 25 positions shown; findings below may reference images not displayed]

FINDINGS: Right thyroid lobe:  4.8 x 1.6 x 1.7 cm.  Mildly heterogeneous
echogenicity.
Left thyroid lobe:  4.8 x 1.5 x 1.8 cm.  Mildly heterogeneous
echogenicity.
Isthmus:  2 mm thick

Focal nodules:  Tiny nonspecific 3 mm right lobe thyroid nodule
near the isthmus.  Additional complex cystic and solid nodule at
mid to upper left thyroid lobe, 18 x 7 x 11 mm, essentially
unchanged.  No new thyroid mass, nodule, or calcification.

Lymphadenopathy:  None visualized.
IMPRESSION: Stable left thyroid nodule; this has been previously biopsied,
please refer to pathology report.
No new abnormalities.

## 2012-11-01 ENCOUNTER — Other Ambulatory Visit: Payer: Self-pay | Admitting: Family Medicine

## 2012-11-01 DIAGNOSIS — E049 Nontoxic goiter, unspecified: Secondary | ICD-10-CM

## 2012-11-11 ENCOUNTER — Ambulatory Visit
Admission: RE | Admit: 2012-11-11 | Discharge: 2012-11-11 | Disposition: A | Discharge status: Home / Self Care | Payer: BC Managed Care – PPO | Source: Ambulatory Visit | Attending: Family Medicine | Admitting: Family Medicine

## 2012-11-11 DIAGNOSIS — E049 Nontoxic goiter, unspecified: Secondary | ICD-10-CM

## 2014-11-27 HISTORY — PX: CHOLECYSTECTOMY: SHX55

## 2015-06-28 ENCOUNTER — Other Ambulatory Visit: Payer: Self-pay

## 2015-10-11 ENCOUNTER — Ambulatory Visit (INDEPENDENT_AMBULATORY_CARE_PROVIDER_SITE_OTHER): Payer: PRIVATE HEALTH INSURANCE | Admitting: Neurology

## 2015-10-11 ENCOUNTER — Encounter: Payer: Self-pay | Admitting: Neurology

## 2015-10-11 VITALS — BP 110/64 | HR 96 | Ht 61.0 in | Wt 139.0 lb

## 2015-10-11 DIAGNOSIS — G243 Spasmodic torticollis: Secondary | ICD-10-CM

## 2015-10-11 DIAGNOSIS — R258 Other abnormal involuntary movements: Secondary | ICD-10-CM | POA: Diagnosis not present

## 2015-10-11 DIAGNOSIS — G252 Other specified forms of tremor: Secondary | ICD-10-CM

## 2015-10-11 DIAGNOSIS — R29810 Facial weakness: Secondary | ICD-10-CM | POA: Diagnosis not present

## 2015-10-11 DIAGNOSIS — R259 Unspecified abnormal involuntary movements: Secondary | ICD-10-CM

## 2015-10-11 DIAGNOSIS — D329 Benign neoplasm of meninges, unspecified: Secondary | ICD-10-CM | POA: Diagnosis not present

## 2015-10-11 NOTE — Progress Notes (Signed)
Subjective:   Cindy Dudley was seen in consultation in the movement disorder clinic at the request of Dr. Christella Noa.  Her PCP is PENNER,PAMELA, MD.  The evaluation is for tremor.  The patient presents today for consultation regarding tremor.  I reviewed previous records made available to me.  This patient is accompanied in the office by her spouse who supplements the history.  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  She states that surgery helped.  However, she developed head tremor within the next 2 years.  Hand tremor started a few years ago, and she states that it is more of an inner tremor than outer.  She does state a few years ago, she did have visible R hand tremor and she went to the PCP and was told to wait and see what happened.  She states that the outer tremor in the hands only lasted 1.5 months but she still feels that she has inner tremor.  She reports that her neck hurts because of tremor.  Reports that she was seen at [facility] by neurosurgery "in the summer" for this as well.  I reviewed [facility] notes; seen by oncology and no tremor mentioned in note.  Seen for meningioma.  She was apparently seen by Dr. Metta Clines years ago and told she had "torticollis."  The patient just recently established with Dr. Cyndy Freeze for a history of meningioma.  She had a meningioma resected in 1995 that she reports is located on the left optic nerve.  She reported improved vision and improved spasmodic dysphonia after the surgery, but stated that she had hearing loss, poor balance, and memory change after the surgery.  States that she still has memory change and word finding trouble.  She is very bothered by this and comes here today more for this than tremor.  States that she really "just wants to know what happened during that surgery that caused these issues."  States that she had significant memory loss for about 6 months, but much of that  resolved.  Dr. Cyndy Freeze ordered an MRI of the brain on June 03, 2015. and this was repeated on 09/16/2015 with and without gadolinium.  There is no significant changes between the 2 MRIs.  There is a 7 x 6 mm left anterior clinoid mass status post felt likely to be a small meningioma, although an optic nerve tumor could not be ruled out.  There is a right frontal meningioma that is 10 mm x 15 mm.  I reviewed those films.    Tremor characteristics: Affected by caffeine: doesn't drink Affected by alcohol:  Doesn't drink Affected by stress:  Yes.   Affected by fatigue:  unknown Spills soup if on spoon:  No. Spills glass of liquid if full:  No. Affects ADL's (tying shoes, brushing teeth, etc):  No.  Feels hand tremor when she writes.  Is right hand dominant   Outside reports reviewed: historical medical records and office notes.  Allergies  Allergen Reactions  . Latex Itching    Per patient  . Sulfa Antibiotics     OTHER REACTION: BLOATING    Outpatient Encounter Prescriptions as of 10/11/2015  Medication Sig  . b complex vitamins capsule Take by mouth.  Marland Kitchen Bioflavonoid Products (GRAPE SEED) 250-50 MG CAPS Take by mouth.  . Calcium Citrate (CITRACAL PO) Take 4 tablets by mouth daily.    . Cholecalciferol (VITAMIN D) 2000 UNITS  tablet Take 2,000 Units by mouth daily.    . Lactobacillus Rhamnosus, GG, (RA PROBIOTIC DIGESTIVE CARE) CAPS Take by mouth.  . Magnesium 500 MG CAPS Take 500-1,000 mg by mouth daily.    . NON FORMULARY as needed. True Focus. Obtain detail from patient regarding dosage, etc.  . PROGESTERONE, VAGINAL, 8 % GEL Place vaginally.   No facility-administered encounter medications on file as of 10/11/2015.    Past Medical History  Diagnosis Date  . Ovarian cyst 1981  . Anemia   . Hearing loss   . Wears glasses   . Fibroid tumor 1982  . Brain tumor ([place]) 1995  . Spasmodic dysphonia 1982  . Spasmodic torticollis 2007  . Colitis, ulcerative ([place]) 1969  . Ulcer   .  Fatigue   . Rash   . Bruises easily   . Kidney stone   . Sore throat   . Asthma   . Blood transfusion     Past Surgical History  Procedure Laterality Date  . Brain tumor excision  1995  . Larynx surgery  2000  . Abdominal hysterectomy  1982  . Ileostomy  1982  . Cholecystectomy  2016    Social History   Social History  . Marital Status: Married    Spouse Name: N/A  . Number of Children: N/A  . Years of Education: N/A   Occupational History  . Not on file.   Social History Main Topics  . Smoking status: Never Smoker   . Smokeless tobacco: Never Used  . Alcohol Use: No  . Drug Use: No  . Sexual Activity: Not on file   Other Topics Concern  . Not on file   Social History Narrative    Family Status  Relation Status Death Age  . Mother Alive     breast cancer, dementia  . Father Deceased     heart disease  . Sister Alive     fibromyalgia, lime disease  . Brother Alive   . Paternal Advertising account executive   . Paternal Uncle      PD    Review of Systems A complete 10 system ROS was obtained and was negative apart from what is mentioned.   Objective:   VITALS:   Filed Vitals:   10/11/15 1405  BP: 110/64  Pulse: 96  Height: 5\' 1"  (1.549 m)  Weight: 139 lb (63.05 kg)   Gen:  Appears stated age and in NAD. HEENT:  Normocephalic, atraumatic. The mucous membranes are moist. The superficial temporal arteries are without ropiness or tenderness. Cardiovascular: Regular rate and rhythm. Lungs: Clear to auscultation bilaterally. Neck: There are no carotid bruits noted bilaterally.  NEUROLOGICAL:  Orientation:  The patient is alert and oriented x 3.  Recent and remote memory are intact.  Attention span and concentration are normal.  Able to name objects and repeat without trouble.  Fund of knowledge is appropriate Cranial nerves: There is left facial droop (patient states that it was noted at her consult at [facility] as well although I did not see that in their notes, but the  patient really does not notice it so does not know how long it has been going on). The pupils are equal round and reactive to light bilaterally. Fundoscopic exam reveals clear disc margins bilaterally. Extraocular muscles are intact and visual fields are full to confrontational testing. Speech is fluent and clear. Soft palate rises symmetrically and there is no tongue deviation. Hearing is intact to conversational tone. Tone: Tone is  good throughout. Sensation: Sensation is intact to light touch and pinprick throughout (facial, trunk, extremities). Vibration is intact at the bilateral big toe but she has difficulty telling when the vibration stops on the left. There is no extinction with double simultaneous stimulation. There is no sensory dermatomal level identified. Coordination:  The patient has no dysdiadichokinesia or dysmetria. Motor: Strength is 5/5 in the bilateral upper and lower extremities.  Shoulder shrug is equal bilaterally.  There is no pronator drift.  There are no fasciculations noted. DTR's: Deep tendon reflexes are 2+/4 at the bilateral biceps, triceps, brachioradialis, patella and achilles.  Plantar responses are downgoing bilaterally. Gait and Station: The patient quickly arises out of the chair without the use of her hands.  She is able to ambulate without difficulty.  In fact, she walks very briskly down the hall with good arm swing.  The patient is able to heel toe walk without any difficulty. The patient is able to ambulate in a tandem fashion. The patient is able to stand in the Romberg position.   MOVEMENT EXAM: Tremor:  There is a head tremor.  The head is turned slightly to the right and sidebending to the left.  Head tremor is complex.  There is a very rare resting tremor of the right upper extremity.  She has no difficulty with Archimedes spirals.  She has no difficulty pouring a very full glass of water from one glass to another.  She spills none.     Assessment/Plan:    1.  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  Head tremor really does not bother her.  She is not interested in Botox.  Told her that we could try medication, but that head tremor is often resistant to medication treatment.  She really does not want any.   2.  Right hand resting tremor  -This was very rare and intermittent.  I saw absolutely no other features of Parkinson's.  She apparently has a family history of such, but I reassured her that I saw no evidence today.  There was no bradykinesia and no rigidity. 3.  Meningiomas  -These are being managed by neurosurgery.  She recently saw oncology at [facility] and neurosurgery here (Dr. Christella Noa).  She will continue to f/u with them  -It seems that her biggest worry is one that I am not sure be answered.  She wants to know why she had memory change and hearing loss after her first meningioma was removed in 1995.  I told her that I really do not know and I am not sure we are going to get an answer to that.  She asked me to try to get records from her surgeon in 1995.  I told her I would be happy to try, but it is likely that those records have been disposed of.   4.  Left facial droop  -This has been present at least since the summer when she consulted at [facility].  She stated that the nurse practitioner mentioned to her.  She really does not notice it at all so she cannot tell me how long it has been there.  Her husband has noted no facial change.  She will continue to follow with neurosurgery and oncology.  5.  She will f/u with me prn

## 2015-10-20 ENCOUNTER — Telehealth: Payer: Self-pay | Admitting: Neurology

## 2015-10-20 NOTE — Telephone Encounter (Signed)
Patient made aware we have not yet received her records from [place].

## 2015-10-20 NOTE — Telephone Encounter (Signed)
PT called in regards to some info regarding her surgery in [place] CB# [phone] or [phone]

## 2015-10-28 ENCOUNTER — Telehealth: Payer: Self-pay | Admitting: Neurology

## 2015-10-28 NOTE — Telephone Encounter (Signed)
Pt wants to know if we got the records from [facility] and the tremors in the rt hand are now showing badly please call her at [phone]

## 2015-10-28 NOTE — Telephone Encounter (Signed)
Patient made aware I called [facility] and they have a two week delay of sending records. Still haven't received them. I will let her know when I do.

## 2015-11-05 ENCOUNTER — Telehealth: Payer: Self-pay | Admitting: Neurology

## 2015-11-05 NOTE — Telephone Encounter (Signed)
Pt returned call/ call back @ [phone]

## 2015-11-05 NOTE — Telephone Encounter (Signed)
Left message with husband for patient to call.

## 2015-11-05 NOTE — Telephone Encounter (Signed)
Patient made aware.

## 2015-11-05 NOTE — Telephone Encounter (Signed)
You can let pt know that 1994 op notes received and there is nothing unusual in the op notes at all.  I know she has called you several times about this.

## 2017-03-29 DIAGNOSIS — M791 Myalgia: Secondary | ICD-10-CM | POA: Diagnosis not present

## 2017-03-29 DIAGNOSIS — R5381 Other malaise: Secondary | ICD-10-CM | POA: Diagnosis not present

## 2017-03-29 DIAGNOSIS — R0789 Other chest pain: Secondary | ICD-10-CM | POA: Diagnosis not present

## 2017-03-29 DIAGNOSIS — E559 Vitamin D deficiency, unspecified: Secondary | ICD-10-CM | POA: Diagnosis not present

## 2017-03-30 DIAGNOSIS — R079 Chest pain, unspecified: Secondary | ICD-10-CM | POA: Diagnosis not present

## 2017-03-30 DIAGNOSIS — E559 Vitamin D deficiency, unspecified: Secondary | ICD-10-CM | POA: Diagnosis not present

## 2017-03-30 DIAGNOSIS — M791 Myalgia: Secondary | ICD-10-CM | POA: Diagnosis not present

## 2017-03-30 DIAGNOSIS — J8410 Pulmonary fibrosis, unspecified: Secondary | ICD-10-CM | POA: Diagnosis not present

## 2017-03-30 DIAGNOSIS — M6281 Muscle weakness (generalized): Secondary | ICD-10-CM | POA: Diagnosis not present

## 2017-03-30 DIAGNOSIS — R5381 Other malaise: Secondary | ICD-10-CM | POA: Diagnosis not present

## 2017-03-30 DIAGNOSIS — R29898 Other symptoms and signs involving the musculoskeletal system: Secondary | ICD-10-CM | POA: Diagnosis not present

## 2017-05-04 DIAGNOSIS — Z1231 Encounter for screening mammogram for malignant neoplasm of breast: Secondary | ICD-10-CM | POA: Diagnosis not present

## 2017-05-24 DIAGNOSIS — H401231 Low-tension glaucoma, bilateral, mild stage: Secondary | ICD-10-CM | POA: Diagnosis not present

## 2017-09-04 DIAGNOSIS — Z23 Encounter for immunization: Secondary | ICD-10-CM | POA: Diagnosis not present

## 2017-09-07 DIAGNOSIS — M25562 Pain in left knee: Secondary | ICD-10-CM | POA: Diagnosis not present

## 2017-09-07 DIAGNOSIS — M1712 Unilateral primary osteoarthritis, left knee: Secondary | ICD-10-CM | POA: Diagnosis not present

## 2017-09-21 DIAGNOSIS — H401231 Low-tension glaucoma, bilateral, mild stage: Secondary | ICD-10-CM | POA: Diagnosis not present

## 2017-10-22 ENCOUNTER — Ambulatory Visit (INDEPENDENT_AMBULATORY_CARE_PROVIDER_SITE_OTHER): Payer: Medicare Other | Admitting: Psychology

## 2017-10-22 DIAGNOSIS — F331 Major depressive disorder, recurrent, moderate: Secondary | ICD-10-CM | POA: Diagnosis not present

## 2017-11-16 ENCOUNTER — Ambulatory Visit (INDEPENDENT_AMBULATORY_CARE_PROVIDER_SITE_OTHER): Payer: Medicare Other | Admitting: Psychology

## 2017-11-16 DIAGNOSIS — F331 Major depressive disorder, recurrent, moderate: Secondary | ICD-10-CM | POA: Diagnosis not present

## 2017-11-30 ENCOUNTER — Ambulatory Visit (INDEPENDENT_AMBULATORY_CARE_PROVIDER_SITE_OTHER): Payer: Medicare Other | Admitting: Psychology

## 2017-11-30 DIAGNOSIS — F331 Major depressive disorder, recurrent, moderate: Secondary | ICD-10-CM

## 2017-12-17 ENCOUNTER — Ambulatory Visit: Payer: Medicare Other | Admitting: Psychology

## 2017-12-28 ENCOUNTER — Ambulatory Visit (INDEPENDENT_AMBULATORY_CARE_PROVIDER_SITE_OTHER): Payer: Medicare Other | Admitting: Psychology

## 2017-12-28 DIAGNOSIS — F331 Major depressive disorder, recurrent, moderate: Secondary | ICD-10-CM | POA: Diagnosis not present

## 2017-12-28 DIAGNOSIS — H401231 Low-tension glaucoma, bilateral, mild stage: Secondary | ICD-10-CM | POA: Diagnosis not present

## 2018-02-08 ENCOUNTER — Ambulatory Visit (INDEPENDENT_AMBULATORY_CARE_PROVIDER_SITE_OTHER): Payer: Medicare Other | Admitting: Psychology

## 2018-02-08 DIAGNOSIS — F331 Major depressive disorder, recurrent, moderate: Secondary | ICD-10-CM | POA: Diagnosis not present

## 2018-02-13 DIAGNOSIS — Z6829 Body mass index (BMI) 29.0-29.9, adult: Secondary | ICD-10-CM | POA: Diagnosis not present

## 2018-02-13 DIAGNOSIS — E663 Overweight: Secondary | ICD-10-CM | POA: Diagnosis not present

## 2018-02-13 DIAGNOSIS — M65822 Other synovitis and tenosynovitis, left upper arm: Secondary | ICD-10-CM | POA: Diagnosis not present

## 2018-02-13 DIAGNOSIS — M810 Age-related osteoporosis without current pathological fracture: Secondary | ICD-10-CM | POA: Diagnosis not present

## 2018-02-13 DIAGNOSIS — M79622 Pain in left upper arm: Secondary | ICD-10-CM | POA: Diagnosis not present

## 2018-02-13 DIAGNOSIS — Z0000 Encounter for general adult medical examination without abnormal findings: Secondary | ICD-10-CM | POA: Diagnosis not present

## 2018-02-13 DIAGNOSIS — Z23 Encounter for immunization: Secondary | ICD-10-CM | POA: Diagnosis not present

## 2018-02-13 DIAGNOSIS — R140 Abdominal distension (gaseous): Secondary | ICD-10-CM | POA: Diagnosis not present

## 2018-02-13 DIAGNOSIS — Z932 Ileostomy status: Secondary | ICD-10-CM | POA: Diagnosis not present

## 2018-02-13 DIAGNOSIS — R635 Abnormal weight gain: Secondary | ICD-10-CM | POA: Diagnosis not present

## 2018-02-13 DIAGNOSIS — Z136 Encounter for screening for cardiovascular disorders: Secondary | ICD-10-CM | POA: Diagnosis not present

## 2018-02-14 DIAGNOSIS — M65822 Other synovitis and tenosynovitis, left upper arm: Secondary | ICD-10-CM | POA: Diagnosis not present

## 2018-02-20 DIAGNOSIS — R140 Abdominal distension (gaseous): Secondary | ICD-10-CM | POA: Diagnosis not present

## 2018-02-27 DIAGNOSIS — M810 Age-related osteoporosis without current pathological fracture: Secondary | ICD-10-CM | POA: Diagnosis not present

## 2018-02-27 DIAGNOSIS — M85852 Other specified disorders of bone density and structure, left thigh: Secondary | ICD-10-CM | POA: Diagnosis not present

## 2018-03-08 ENCOUNTER — Ambulatory Visit (INDEPENDENT_AMBULATORY_CARE_PROVIDER_SITE_OTHER): Payer: Medicare Other | Admitting: Psychology

## 2018-03-08 DIAGNOSIS — F331 Major depressive disorder, recurrent, moderate: Secondary | ICD-10-CM | POA: Diagnosis not present

## 2018-03-18 ENCOUNTER — Encounter: Payer: Self-pay | Admitting: Allergy and Immunology

## 2018-03-18 ENCOUNTER — Ambulatory Visit (INDEPENDENT_AMBULATORY_CARE_PROVIDER_SITE_OTHER): Payer: Medicare Other | Admitting: Allergy and Immunology

## 2018-03-18 VITALS — BP 136/80 | HR 78 | Temp 98.7°F | Resp 20 | Ht 60.0 in | Wt 150.0 lb

## 2018-03-18 DIAGNOSIS — H8113 Benign paroxysmal vertigo, bilateral: Secondary | ICD-10-CM

## 2018-03-18 DIAGNOSIS — Z91018 Allergy to other foods: Secondary | ICD-10-CM

## 2018-03-18 DIAGNOSIS — R140 Abdominal distension (gaseous): Secondary | ICD-10-CM

## 2018-03-18 NOTE — Progress Notes (Signed)
Dear Dr. Burnett Sheng,  Thank you for referring Jakala Herford to the [facility] on 03/18/2018.   Below is a summation of this patient's evaluation and recommendations.  Thank you for your referral. I will keep you informed about this patient's response to treatment.   If you have any questions please do not hesitate to contact me.   Sincerely,  Jiles Prows, MD Allergy / Immunology [place]   ______________________________________________________________________    NEW PATIENT NOTE  Referring Provider: Greig Right, MD Primary Provider: Greig Right, MD Date of office visit: 03/18/2018    Subjective:   Chief Complaint:  Cindy Dudley (DOB: [birth year]) is a 67 y.o. female who presents to the clinic on 03/18/2018 with a chief complaint of Bloated and Angioedema .     HPI: Dierdra presents to this clinic in evaluation of bloating with possible food trigger.  She has a many year history of recurrent problems with bloating that can occur several times per week and can last a day or so without any associated diarrhea.  She does get the sensation that her inguinal area swells up when this happens and she does have some gas during these episodes.  She believes that this is secondary to eating some type of food.  Otherwise, she does not really relate a history of any other trigger giving rise to this issue.  She is not really sure what specific food gives rise to this issue.  She did have a cholecystectomy in 2017 in the treatment of this bloating and she may have had a transient response with that treatment.  She has had an ileostomy in 1982 for ulcerative colitis.  She also relates a history of positional vertigo.  Every time she lays down at night her head "spins" for a few minutes.  If she turns over at nighttime her head can spin for a minute or so.  She has been stuck in this pattern for the  past 12 months.  She does have hearing loss and she has had hearing aids placed.  There is not a history of significant tinnitus.  Past Medical History:  Diagnosis Date  . Anemia   . Asthma   . Blood transfusion   . Brain tumor ([place]) 1995  . Bruises easily   . Colitis, ulcerative ([place]) 1969  . Fatigue   . Fibroid tumor 1982  . Hearing loss   . Kidney stone   . Ovarian cyst 1981  . Rash   . Sore throat   . Spasmodic dysphonia 1982  . Spasmodic torticollis 2007  . Ulcer   . Wears glasses     Past Surgical History:  Procedure Laterality Date  . ABDOMINAL HYSTERECTOMY  1982  . BRAIN TUMOR EXCISION  1995  . CHOLECYSTECTOMY  2016  . ILEOSTOMY  1982  . LARYNX SURGERY  2000  . OVARIAN CYST REMOVAL      Allergies as of 03/18/2018      Reactions   Latex Itching   Per patient   Sulfa Antibiotics    OTHER REACTION: BLOATING      Medication List      Calcium Citrate-Vitamin D 250-200 MG-UNIT Tabs Take by mouth.   chlorhexidine 0.12 % solution Commonly known as:  PERIDEX   CITRACAL PO Take 4 tablets by mouth daily.   GRAPE SEED EXTRACT PO Take by mouth.   IBU 600 MG tablet Generic  drug:  ibuprofen   latanoprost 0.005 % ophthalmic solution Commonly known as:  XALATAN   Magnesium 500 MG Caps Take 500-1,000 mg by mouth daily.   NON FORMULARY as needed. True Focus. Obtain detail from patient regarding dosage, etc.   penicillin v potassium 500 MG tablet Commonly known as:  VEETID   PROGESTERONE (VAGINAL) 8 % Gel Place vaginally.   RA PROBIOTIC DIGESTIVE CARE Caps Take by mouth.   UNABLE TO FIND Med Name:"Ionic Trace Minerals" - takes 1/2 tsp daily   vitamin C 1000 MG tablet Take 1,000 mg by mouth daily.   Vitamin D 2000 units tablet Take by mouth.       Review of systems negative except as noted in HPI / PMHx or noted below:  Review of Systems  Constitutional: Negative.   HENT: Negative.   Eyes: Negative.   Respiratory: Negative.     Cardiovascular: Negative.   Gastrointestinal: Negative.   Genitourinary: Negative.   Musculoskeletal: Negative.   Skin: Negative.   Neurological: Negative.   Endo/Heme/Allergies: Negative.   Psychiatric/Behavioral: Negative.     Family History  Problem Relation Age of Onset  . Cancer Mother        breast  . Food Allergy Mother   . Allergic rhinitis Mother   . Heart disease Father   . Fibromyalgia Sister   . Cancer Paternal Aunt        colon  . Diabetes Paternal Grandmother     Social History   Socioeconomic History  . Marital status: Married    Spouse name: Not on file  . Number of children: Not on file  . Years of education: Not on file  . Highest education level: Not on file  Occupational History  . Not on file  Social Needs  . Financial resource strain: Not on file  . Food insecurity:    Worry: Not on file    Inability: Not on file  . Transportation needs:    Medical: Not on file    Non-medical: Not on file  Tobacco Use  . Smoking status: Never Smoker  . Smokeless tobacco: Never Used  Substance and Sexual Activity  . Alcohol use: No  . Drug use: No  . Sexual activity: Not on file  Lifestyle  . Physical activity:    Days per week: Not on file    Minutes per session: Not on file  . Stress: Not on file  Relationships  . Social connections:    Talks on phone: Not on file    Gets together: Not on file    Attends religious service: Not on file    Active member of club or organization: Not on file    Attends meetings of clubs or organizations: Not on file    Relationship status: Not on file  . Intimate partner violence:    Fear of current or ex partner: Not on file    Emotionally abused: Not on file    Physically abused: Not on file    Forced sexual activity: Not on file  Other Topics Concern  . Not on file  Social History Narrative  . Not on file    Environmental and Social history  Lives in a house with a dry environment, no animals located  inside the household, laminate on the bedroom floor, no plastic on the bed, no plastic on the pillow, no smokers located inside the household.  Objective:   Vitals:   03/18/18 0930  BP: 136/80  Pulse:  78  Resp: 20  Temp: 98.7 F (37.1 C)   Height: 5' (152.4 cm) Weight: 150 lb (68 kg)  Physical Exam  HENT:  Head: Normocephalic. Head is without right periorbital erythema and without left periorbital erythema.  Right Ear: External ear normal.  Left Ear: External ear normal.  Nose: Nose normal. No mucosal edema or rhinorrhea.  Mouth/Throat: Oropharynx is clear and moist and mucous membranes are normal. No oropharyngeal exudate.  Bilateral hearing aids  Eyes: Pupils are equal, round, and reactive to light. Conjunctivae and lids are normal.  Neck: Trachea normal. No tracheal deviation present. No thyromegaly present.  Cardiovascular: Normal rate, regular rhythm, S1 normal, S2 normal and normal heart sounds.  No murmur heard. Pulmonary/Chest: Effort normal. No stridor. No respiratory distress. She has no wheezes. She has no rales. She exhibits no tenderness.  Abdominal: Soft. She exhibits no distension and no mass. There is no hepatosplenomegaly. There is no tenderness. There is no rebound and no guarding.  Right ileostomy stump  Musculoskeletal: She exhibits no edema or tenderness.  Lymphadenopathy:       Head (right side): No tonsillar adenopathy present.       Head (left side): No tonsillar adenopathy present.    She has no cervical adenopathy.    She has no axillary adenopathy.  Neurological: She is alert.  Skin: No rash noted. She is not diaphoretic. No erythema. No pallor. Nails show no clubbing.    Diagnostics: Allergy skin tests were performed.  She did not demonstrate any hypersensitivity against a screening panel of foods.   Assessment and Plan:    1. Postprandial abdominal bloating   2. Food allergy   3. Benign paroxysmal positional vertigo due to bilateral  vestibular disorder     1.  Allergen avoidance measures?  2.  Discontinue all supplements except for magnesium and calcium  3.  Blood -celiac screen with IgA  4.  Use prolonged transition time when changing position  5.  Further evaluation?  6.  Gluten-free diet?  Any has a abdominal bloating disorder of unknown etiologic factor.  I am not sure that allergy plays a significant role in this issue.  I am going to have her discontinue all of her over-the-counter supplements and we will rule out celiac disease and there may be a role for empiric gluten-free diet to see if this helps with her bloating.  She also appears to have positional vertigo and I talked to her today about techniques she can use for addressing this issue.  I will contact her with the results of her blood tests once they are available for review.  Jiles Prows, MD Allergy / Immunology [facility]

## 2018-03-18 NOTE — Patient Instructions (Addendum)
  1.  Allergen avoidance measures?  2.  Discontinue all supplements except for magnesium and calcium  3.  Blood -celiac screen with IgA  4.  Use prolonged transition time when changing position  5.  Further evaluation?  6.  Gluten-free diet?

## 2018-03-19 ENCOUNTER — Encounter: Payer: Self-pay | Admitting: Allergy and Immunology

## 2018-03-20 LAB — CELIAC DISEASE AB SCREEN W/RFX
ANTIGLIADIN ABS, IGA: 11 U (ref 0–19)
IgA/Immunoglobulin A, Serum: 324 mg/dL (ref 87–352)

## 2018-03-21 ENCOUNTER — Telehealth: Payer: Self-pay | Admitting: Allergy and Immunology

## 2018-03-21 NOTE — Telephone Encounter (Signed)
Please inform patient that a few days of gluten free diet will NOT alter the blood test results. She may want to try a 100% gluten free diet for 4-8 weeks to see if this helps her. That dietary manipulation would be more informative than a repeat blood test.

## 2018-03-21 NOTE — Telephone Encounter (Signed)
Cindy Dudley would like to have the results from her lab work.  Also, Loda stated she researched online and she read that if one eats gluten free prior to testing, the test will come back negative.  Palestine wanted it noted she ate gluten free several days prior to testing.  Richard states IF the test came back negative that may be why and would like to know if she can get retested IF the lab results come back negative. Please advise.

## 2018-03-22 NOTE — Telephone Encounter (Signed)
Patient has been informed and advised of this recommendation. I also let her know that the results had not been reviewed just yet and we would give her a call as soon as they were resulted.

## 2018-03-25 ENCOUNTER — Telehealth: Payer: Self-pay | Admitting: Allergy and Immunology

## 2018-03-25 NOTE — Telephone Encounter (Signed)
It looks like her labs are back if you could please take a look, thanks!

## 2018-03-25 NOTE — Telephone Encounter (Signed)
Cindy Dudley would like her lab results please

## 2018-03-27 ENCOUNTER — Encounter: Payer: Self-pay | Admitting: Gastroenterology

## 2018-04-10 DIAGNOSIS — H401231 Low-tension glaucoma, bilateral, mild stage: Secondary | ICD-10-CM | POA: Diagnosis not present

## 2018-04-26 DIAGNOSIS — M722 Plantar fascial fibromatosis: Secondary | ICD-10-CM | POA: Diagnosis not present

## 2018-05-02 DIAGNOSIS — F411 Generalized anxiety disorder: Secondary | ICD-10-CM | POA: Diagnosis not present

## 2018-05-06 DIAGNOSIS — Z1231 Encounter for screening mammogram for malignant neoplasm of breast: Secondary | ICD-10-CM | POA: Diagnosis not present

## 2018-05-08 DIAGNOSIS — D329 Benign neoplasm of meninges, unspecified: Secondary | ICD-10-CM | POA: Diagnosis not present

## 2018-05-13 DIAGNOSIS — D329 Benign neoplasm of meninges, unspecified: Secondary | ICD-10-CM | POA: Diagnosis not present

## 2018-05-13 DIAGNOSIS — Z6828 Body mass index (BMI) 28.0-28.9, adult: Secondary | ICD-10-CM | POA: Diagnosis not present

## 2018-05-13 DIAGNOSIS — R030 Elevated blood-pressure reading, without diagnosis of hypertension: Secondary | ICD-10-CM | POA: Diagnosis not present

## 2018-05-15 DIAGNOSIS — F411 Generalized anxiety disorder: Secondary | ICD-10-CM | POA: Diagnosis not present

## 2018-05-21 ENCOUNTER — Ambulatory Visit: Payer: Self-pay | Admitting: Gastroenterology

## 2018-05-29 DIAGNOSIS — F411 Generalized anxiety disorder: Secondary | ICD-10-CM | POA: Diagnosis not present

## 2018-05-31 ENCOUNTER — Ambulatory Visit: Payer: Medicare Other | Admitting: Psychology

## 2018-06-12 DIAGNOSIS — F411 Generalized anxiety disorder: Secondary | ICD-10-CM | POA: Diagnosis not present

## 2018-07-01 DIAGNOSIS — F411 Generalized anxiety disorder: Secondary | ICD-10-CM | POA: Diagnosis not present

## 2018-07-10 DIAGNOSIS — F411 Generalized anxiety disorder: Secondary | ICD-10-CM | POA: Diagnosis not present

## 2018-07-17 DIAGNOSIS — F411 Generalized anxiety disorder: Secondary | ICD-10-CM | POA: Diagnosis not present

## 2018-08-07 DIAGNOSIS — F411 Generalized anxiety disorder: Secondary | ICD-10-CM | POA: Diagnosis not present

## 2018-08-09 DIAGNOSIS — H401231 Low-tension glaucoma, bilateral, mild stage: Secondary | ICD-10-CM | POA: Diagnosis not present

## 2018-08-15 DIAGNOSIS — F411 Generalized anxiety disorder: Secondary | ICD-10-CM | POA: Diagnosis not present

## 2018-08-16 DIAGNOSIS — M7061 Trochanteric bursitis, right hip: Secondary | ICD-10-CM | POA: Diagnosis not present

## 2018-08-29 DIAGNOSIS — F411 Generalized anxiety disorder: Secondary | ICD-10-CM | POA: Diagnosis not present

## 2018-08-30 DIAGNOSIS — H8110 Benign paroxysmal vertigo, unspecified ear: Secondary | ICD-10-CM | POA: Diagnosis not present

## 2018-09-02 DIAGNOSIS — M5416 Radiculopathy, lumbar region: Secondary | ICD-10-CM | POA: Diagnosis not present

## 2018-09-02 DIAGNOSIS — M5136 Other intervertebral disc degeneration, lumbar region: Secondary | ICD-10-CM | POA: Diagnosis not present

## 2018-09-02 DIAGNOSIS — M9903 Segmental and somatic dysfunction of lumbar region: Secondary | ICD-10-CM | POA: Diagnosis not present

## 2018-09-02 DIAGNOSIS — M5415 Radiculopathy, thoracolumbar region: Secondary | ICD-10-CM | POA: Diagnosis not present

## 2018-09-02 DIAGNOSIS — M9905 Segmental and somatic dysfunction of pelvic region: Secondary | ICD-10-CM | POA: Diagnosis not present

## 2018-09-02 DIAGNOSIS — M9904 Segmental and somatic dysfunction of sacral region: Secondary | ICD-10-CM | POA: Diagnosis not present

## 2018-09-03 DIAGNOSIS — Z23 Encounter for immunization: Secondary | ICD-10-CM | POA: Diagnosis not present

## 2018-09-04 DIAGNOSIS — F411 Generalized anxiety disorder: Secondary | ICD-10-CM | POA: Diagnosis not present

## 2018-09-09 DIAGNOSIS — M5416 Radiculopathy, lumbar region: Secondary | ICD-10-CM | POA: Diagnosis not present

## 2018-09-09 DIAGNOSIS — M9905 Segmental and somatic dysfunction of pelvic region: Secondary | ICD-10-CM | POA: Diagnosis not present

## 2018-09-09 DIAGNOSIS — M9903 Segmental and somatic dysfunction of lumbar region: Secondary | ICD-10-CM | POA: Diagnosis not present

## 2018-09-09 DIAGNOSIS — M9904 Segmental and somatic dysfunction of sacral region: Secondary | ICD-10-CM | POA: Diagnosis not present

## 2018-09-09 DIAGNOSIS — M5415 Radiculopathy, thoracolumbar region: Secondary | ICD-10-CM | POA: Diagnosis not present

## 2018-09-09 DIAGNOSIS — M5136 Other intervertebral disc degeneration, lumbar region: Secondary | ICD-10-CM | POA: Diagnosis not present

## 2018-09-10 DIAGNOSIS — M5416 Radiculopathy, lumbar region: Secondary | ICD-10-CM | POA: Diagnosis not present

## 2018-09-10 DIAGNOSIS — M9905 Segmental and somatic dysfunction of pelvic region: Secondary | ICD-10-CM | POA: Diagnosis not present

## 2018-09-10 DIAGNOSIS — M9904 Segmental and somatic dysfunction of sacral region: Secondary | ICD-10-CM | POA: Diagnosis not present

## 2018-09-10 DIAGNOSIS — M5136 Other intervertebral disc degeneration, lumbar region: Secondary | ICD-10-CM | POA: Diagnosis not present

## 2018-09-10 DIAGNOSIS — M9903 Segmental and somatic dysfunction of lumbar region: Secondary | ICD-10-CM | POA: Diagnosis not present

## 2018-09-10 DIAGNOSIS — M5415 Radiculopathy, thoracolumbar region: Secondary | ICD-10-CM | POA: Diagnosis not present

## 2018-09-11 DIAGNOSIS — M5416 Radiculopathy, lumbar region: Secondary | ICD-10-CM | POA: Diagnosis not present

## 2018-09-11 DIAGNOSIS — M9905 Segmental and somatic dysfunction of pelvic region: Secondary | ICD-10-CM | POA: Diagnosis not present

## 2018-09-11 DIAGNOSIS — M9904 Segmental and somatic dysfunction of sacral region: Secondary | ICD-10-CM | POA: Diagnosis not present

## 2018-09-11 DIAGNOSIS — M9903 Segmental and somatic dysfunction of lumbar region: Secondary | ICD-10-CM | POA: Diagnosis not present

## 2018-09-11 DIAGNOSIS — M5415 Radiculopathy, thoracolumbar region: Secondary | ICD-10-CM | POA: Diagnosis not present

## 2018-09-11 DIAGNOSIS — M5136 Other intervertebral disc degeneration, lumbar region: Secondary | ICD-10-CM | POA: Diagnosis not present

## 2018-09-16 ENCOUNTER — Encounter: Payer: Self-pay | Admitting: Neurology

## 2018-09-18 ENCOUNTER — Ambulatory Visit (INDEPENDENT_AMBULATORY_CARE_PROVIDER_SITE_OTHER): Payer: Medicare Other | Admitting: Neurology

## 2018-09-18 ENCOUNTER — Encounter: Payer: Self-pay | Admitting: Neurology

## 2018-09-18 VITALS — BP 117/72 | HR 81 | Ht 60.0 in | Wt 146.4 lb

## 2018-09-18 DIAGNOSIS — D320 Benign neoplasm of cerebral meninges: Secondary | ICD-10-CM | POA: Insufficient documentation

## 2018-09-18 DIAGNOSIS — G250 Essential tremor: Secondary | ICD-10-CM | POA: Diagnosis not present

## 2018-09-18 DIAGNOSIS — M9904 Segmental and somatic dysfunction of sacral region: Secondary | ICD-10-CM | POA: Diagnosis not present

## 2018-09-18 DIAGNOSIS — M5416 Radiculopathy, lumbar region: Secondary | ICD-10-CM | POA: Diagnosis not present

## 2018-09-18 DIAGNOSIS — M5415 Radiculopathy, thoracolumbar region: Secondary | ICD-10-CM | POA: Diagnosis not present

## 2018-09-18 DIAGNOSIS — M9903 Segmental and somatic dysfunction of lumbar region: Secondary | ICD-10-CM | POA: Diagnosis not present

## 2018-09-18 DIAGNOSIS — F411 Generalized anxiety disorder: Secondary | ICD-10-CM | POA: Diagnosis not present

## 2018-09-18 DIAGNOSIS — M9905 Segmental and somatic dysfunction of pelvic region: Secondary | ICD-10-CM | POA: Diagnosis not present

## 2018-09-18 DIAGNOSIS — M5136 Other intervertebral disc degeneration, lumbar region: Secondary | ICD-10-CM | POA: Diagnosis not present

## 2018-09-18 MED ORDER — PRIMIDONE 50 MG PO TABS: ORAL_TABLET | ORAL | 3 refills | Status: DC

## 2018-09-18 NOTE — Progress Notes (Signed)
NEUROLOGY CLINIC   Provider:  Larey Seat, M D  Primary Care Physician:  Greig Right, MD   Referring Provider: Greig Right, MD    Chief Complaint  Patient presents with  . New Patient (Initial Visit)    Rm 10, Husband  . Referred by Greig Right MD    Meningioma    HPI:  Cindy Dudley is a 67 y.o. female patient is accompanied by her husband who is a sleep patient - seen here as in a referral from Dr. Burnett Sheng / Dr. Ashok Pall to have a neurological opinion about meningeomata , having had a single tumor resection in 1994 in [place] , [place] and  had aftercare at [facility].  A couple of years ago she was seen at [facility], and was told she may have had stroke. Her MRI was performed at [facility] and did not show any strokes, but her insurance couldn't cover care at [facility]. She believes she was seen by a PA not MD.  Cindy Dudley presents today being worried that the meningioma time may have grown or causing other symptoms.    Medical history /family history: She is a meanwhile 67 year old Caucasian right-handed female, seen here in the presence of her husband, with a surgical history of cholecystectomy in 2016 hysterectomy in 1980, ileostomy in 1982, meningioma resection in 1994, fibroid tumor and ovarian cysts 2000.  She has GERD, ulcerative cystitis colitis with total colectomy, renal stones, osteoporosis with vitamin D deficiency, dysphagia, spasmodic dysphonia since 1980, she was diagnosed with glaucoma in May 2017.  There is a family history of 1 of her 3 sisters positive for arrhythmia, one sister has torticollis, her brother has hyperlipidemia and throat cancer.  Her mother died at age 55 cause of death was pneumonia she had hypertension, breast cancer, renal stones, dementia and glaucoma her father died at age 101 of heart disease but also had suffered from gout.   Chief complaint according to patient :  Patient believes she is gaining weight due to  meningeoma, hearing loss.   Social history: married, lives independently, patient is able to keep her home, she cooks she drives she lives and dresses independently.  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   Review of Systems: Out of a complete 14 system review, the patient complains of only the following symptoms, and all other reviewed systems are negative.  Spasmodic dysphonia,  tremulous voice, hearing loss.     Social History   Socioeconomic History  . Marital status: Married    Spouse name: Not on file  . Number of children: Not on file  . Years of education: Not on file  . Highest education level: Not on file  Occupational History  . Not on file  Social Needs  . Financial resource strain: Not on file  . Food insecurity:    Worry: Not on file    Inability: Not on file  . Transportation needs:    Medical: Not on file    Non-medical: Not on file  Tobacco Use  . Smoking status: Never Smoker  . Smokeless tobacco: Never Used  Substance and Sexual Activity  . Alcohol use: No  . Drug use: No  . Sexual activity: Not on file  Lifestyle  . Physical activity:    Days per week: Not on file  Minutes per session: Not on file  . Stress: Not on file  Relationships  . Social connections:    Talks on phone: Not on file    Gets together: Not on file    Attends religious service: Not on file    Active member of club or organization: Not on file    Attends meetings of clubs or organizations: Not on file    Relationship status: Not on file  . Intimate partner violence:    Fear of current or ex partner: Not on file    Emotionally abused: Not on file    Physically abused: Not on file    Forced sexual activity: Not on file  Other Topics Concern  . Not on file  Social History Narrative   Lives home with husband.  Works for [employer].  Education  college (6 months) for [employer].   Children 3.      Family History  Problem Relation Age of Onset  . Cancer Mother        breast  . Food Allergy Mother   . Allergic rhinitis Mother   . Dementia Mother   . Hypertension Mother   . Glaucoma Mother   . Heart disease Father   . Gout Father   . Fibromyalgia Sister   . Hyperlipidemia Brother   . Throat cancer Brother   . Cancer Paternal Aunt        colon  . Diabetes Paternal Grandmother     Past Medical History:  Diagnosis Date  . Anemia   . Asthma   . Blood transfusion   . Brain tumor ([place]) 1995  . Bruises easily   . Colitis, ulcerative ([place]) 1969  . Fatigue   . Fibroid tumor 1982  . Hearing loss   . Kidney stone   . Ovarian cyst 1981  . Rash   . Sore throat   . Spasmodic dysphonia 1982  . Spasmodic torticollis 2007  . Ulcer   . Wears glasses     Past Surgical History:  Procedure Laterality Date  . ABDOMINAL HYSTERECTOMY  1982  . BRAIN TUMOR EXCISION  1995  . CHOLECYSTECTOMY  2016  . ILEOSTOMY  1982  . LARYNX SURGERY  2000  . OVARIAN CYST REMOVAL      Current Outpatient Medications  Medication Sig Dispense Refill  . Ascorbic Acid (VITAMIN C) 1000 MG tablet Take 1,000 mg by mouth daily.    . Calcium Citrate (CITRACAL PO) Take 4 tablets by mouth daily.      . Cholecalciferol (VITAMIN D PO) Take 3,500 Units by mouth daily.    Marland Kitchen GRAPE SEED EXTRACT PO Take by mouth.    . IBU 600 MG tablet Take 600 mg by mouth as needed.     . Lactobacillus Rhamnosus, GG, (RA PROBIOTIC DIGESTIVE CARE) CAPS Take by mouth.    . latanoprost (XALATAN) 0.005 % ophthalmic solution     . Magnesium 500 MG CAPS Take 1,000 mg by mouth daily.     . NON FORMULARY as needed. True Focus. Obtain detail from patient regarding dosage, etc.    . PROGESTERONE, VAGINAL, 8 % GEL Place vaginally.    Marland Kitchen UNABLE TO FIND Med Name:"Ionic Trace Minerals" - takes 1/2 tsp daily     No current facility-administered medications for this visit.     Allergies as of  09/18/2018 - Review Complete 09/18/2018  Allergen Reaction Noted  . Latex Itching 02/01/2011  . Sulfa antibiotics  10/11/2015    Vitals:  BP 117/72   Pulse 81   Ht 5' (1.524 m)   Wt 146 lb 6.4 oz (66.4 kg)   BMI 28.59 kg/m  Last Weight:  Wt Readings from Last 1 Encounters:  09/18/18 146 lb 6.4 oz (66.4 kg)   NWG:NFAO mass index is 28.59 kg/m.     Last Height:   Ht Readings from Last 1 Encounters:  09/18/18 5' (1.524 m)    Physical exam:  General: The patient is awake, alert and appears not in acute distress. The patient is well groomed. Head: Normocephalic, atraumatic. Neck is supple.   Nasal airflow patent ,  Retrognathia is mild. Small lower jaw.  Cardiovascular:  Regular rate and rhythm , without  murmurs or carotid bruit, and without distended neck veins. Respiratory: Lungs are clear to auscultation. Skin:  Without evidence of edema, or rash Trunk: BMI is 28.6 . The patient's posture is erect   Neurologic exam : The patient is awake and alert, oriented to place and time.   Memory subjective described as intact. Attention span & concentration ability appears normal.  Speech is fluent, without dysarthria, with mild dysphonia.  Mood and affect are worried.  Cranial nerves: Pupils are equal and briskly reactive to light. Funduscopic exam failed, I could not appreciate her retina.  Extraocular movements  in vertical and horizontal planes intact and without nystagmus. Visual fields by finger perimetry are intact. Hearing to finger rub intact.  Facial sensation intact to fine touch. Facial motor strength is symmetric and tongue and uvula move midline. Shoulder shrug was symmetrical. Normal head rotation. Titubation- normal eye movements  Motor exam:  Normal tone, muscle bulk and symmetric strength in all extremities. Sensory:  Fine touch, pinprick and vibration were tested in all extremities. Proprioception tested in the upper extremities was normal. Coordination: Rapid  alternating movements in the fingers/hands was normal. Finger-to-nose maneuver  normal without evidence of ataxia, dysmetria but with mild essential  Tremor. No resting tremor.  Gait and station: Patient walks without assistive device and is able unassisted to climb up to the exam table. Strength within normal limits.  Stance is stable and normal.Tandem gait is unfragmented.  Deep tendon reflexes: in the  upper and lower extremities are symmetric and intact.    Assessment:  After physical and neurologic examination, review of laboratory studies,  Personal review of imaging studies, reports of other /same  Imaging studies, results of polysomnography and / or neurophysiology testing and pre-existing records as far as provided in visit., my assessment is   1) essential tremor, primidone to be started.   2) 2017 and 2019  image accessible for me, she has developed probably for meningiomata, the most prominent is 14 x 17 mm in the left frontal lobe very much on the surface.  There is dural enhancement of the left hemisphere corresponding with an area of the previous frontal resection site and here is some thickening of the dura noted between 4 x 15 mm probably about 4 x 12 mm suggesting that a new meningioma may grow.  The right anterior medial frontal parafalcine mass is the largest.  In addition a very small lesion 7 x 5 mm at the left orbital anterior clinoid region and a fourth area was nodular dural thickening left middle cranial fossa which was presented first in 2017 and is probably slightly increased by about 1 mm.  In discussion with the patient I do not think of any of her current symptoms to be necessarily related to these  findings but they warrant follow-up and to Dr. Jola Baptist had recommended q. 86-month intervals which I would think would be justified.  3) headaches without fullness, may be sinus left frontal related.  The patient was advised of the nature of the diagnosed disorder , the treatment  options and the  risks for general health and wellness arising from not treating the condition.   I spent more than 60 minutes of face to face time with the patient.  Greater than 50% of time was spent in counseling and coordination of care. We have discussed the diagnosis and differential and I answered the patient's questions.    Plan:  Treatment plan and additional workup :  I agree with q 6 month MRI follow up- [place] [facility].  I would suggest to treat her titubation ( not torticollis) with primidone.  Rv in 8 month or as needed.    Larey Seat, MD 51/88/4166, 0:63 PM  Certified in Neurology by ABPN Certified in [place] by [facility] [address] [place], [place] [address]

## 2018-09-18 NOTE — Patient Instructions (Signed)
Tremor A tremor is trembling or shaking that you cannot control. Most tremors affect the hands or arms. Tremors can also affect the head, vocal cords, face, and other parts of the body. There are many types of tremors. Common types include:  Essential tremor. These usually occur in people over the age of 40. It may run in families and can happen in otherwise healthy people.  Resting tremor. These occur when the muscles are at rest, such as when your hands are resting in your lap. People with Parkinson disease often have resting tremors.  Postural tremor. These occur when you try to hold a pose, such as keeping your hands outstretched.  Kinetic tremor. These occur during purposeful movement, such as trying to touch a finger to your nose.  Task-specific tremor. These may occur when you perform tasks such as handwriting, speaking, or standing.  Psychogenic tremor. These dramatically lessen or disappear when you are distracted. They can happen in people of all ages.  Some types of tremors have no known cause. Tremors can also be a symptom of nervous system problems (neurological disorders) that may occur with aging. Some tremors go away with treatment while others do not. Follow these instructions at home: Watch your tremor for any changes. The following actions may help to lessen any discomfort you are feeling:  Take medicines only as directed by your health care provider.  Limit alcohol intake to no more than 1 drink per day for nonpregnant women and 2 drinks per day for men. One drink equals 12 oz of beer, 5 oz of wine, or 1 oz of hard liquor.  Do not use any tobacco products, including cigarettes, chewing tobacco, or electronic cigarettes. If you need help quitting, ask your health care provider.  Avoid extreme heat or cold.  Limit the amount of caffeine you consumeas directed by your health care provider.  Try to get 8 hours of sleep each night.  Find ways to manage your stress,  such as meditation or yoga.  Keep all follow-up visits as directed by your health care provider. This is important.  Contact a health care provider if:  You start having a tremor after starting a new medicine.  You have tremor with other symptoms such as: ? Numbness. ? Tingling. ? Pain. ? Weakness.  Your tremor gets worse.  Your tremor interferes with your day-to-day life. This information is not intended to replace advice given to you by your health care provider. Make sure you discuss any questions you have with your health care provider. Document Released: 11/03/2002 Document Revised: 07/16/2016 Document Reviewed: 05/11/2014 Elsevier Interactive Patient Education  2018 [employer].  

## 2018-09-19 DIAGNOSIS — M5136 Other intervertebral disc degeneration, lumbar region: Secondary | ICD-10-CM | POA: Diagnosis not present

## 2018-09-19 DIAGNOSIS — M5415 Radiculopathy, thoracolumbar region: Secondary | ICD-10-CM | POA: Diagnosis not present

## 2018-09-19 DIAGNOSIS — M9903 Segmental and somatic dysfunction of lumbar region: Secondary | ICD-10-CM | POA: Diagnosis not present

## 2018-09-19 DIAGNOSIS — M9904 Segmental and somatic dysfunction of sacral region: Secondary | ICD-10-CM | POA: Diagnosis not present

## 2018-09-19 DIAGNOSIS — M9905 Segmental and somatic dysfunction of pelvic region: Secondary | ICD-10-CM | POA: Diagnosis not present

## 2018-09-19 DIAGNOSIS — M5416 Radiculopathy, lumbar region: Secondary | ICD-10-CM | POA: Diagnosis not present

## 2018-09-20 DIAGNOSIS — M5415 Radiculopathy, thoracolumbar region: Secondary | ICD-10-CM | POA: Diagnosis not present

## 2018-09-20 DIAGNOSIS — M5136 Other intervertebral disc degeneration, lumbar region: Secondary | ICD-10-CM | POA: Diagnosis not present

## 2018-09-20 DIAGNOSIS — M9903 Segmental and somatic dysfunction of lumbar region: Secondary | ICD-10-CM | POA: Diagnosis not present

## 2018-09-20 DIAGNOSIS — M9905 Segmental and somatic dysfunction of pelvic region: Secondary | ICD-10-CM | POA: Diagnosis not present

## 2018-09-20 DIAGNOSIS — M5416 Radiculopathy, lumbar region: Secondary | ICD-10-CM | POA: Diagnosis not present

## 2018-09-20 DIAGNOSIS — M9904 Segmental and somatic dysfunction of sacral region: Secondary | ICD-10-CM | POA: Diagnosis not present

## 2018-09-23 DIAGNOSIS — M9903 Segmental and somatic dysfunction of lumbar region: Secondary | ICD-10-CM | POA: Diagnosis not present

## 2018-09-23 DIAGNOSIS — M9904 Segmental and somatic dysfunction of sacral region: Secondary | ICD-10-CM | POA: Diagnosis not present

## 2018-09-23 DIAGNOSIS — M9905 Segmental and somatic dysfunction of pelvic region: Secondary | ICD-10-CM | POA: Diagnosis not present

## 2018-09-23 DIAGNOSIS — M5136 Other intervertebral disc degeneration, lumbar region: Secondary | ICD-10-CM | POA: Diagnosis not present

## 2018-09-23 DIAGNOSIS — M5415 Radiculopathy, thoracolumbar region: Secondary | ICD-10-CM | POA: Diagnosis not present

## 2018-09-23 DIAGNOSIS — M5416 Radiculopathy, lumbar region: Secondary | ICD-10-CM | POA: Diagnosis not present

## 2018-09-25 DIAGNOSIS — M9904 Segmental and somatic dysfunction of sacral region: Secondary | ICD-10-CM | POA: Diagnosis not present

## 2018-09-25 DIAGNOSIS — M9905 Segmental and somatic dysfunction of pelvic region: Secondary | ICD-10-CM | POA: Diagnosis not present

## 2018-09-25 DIAGNOSIS — M5136 Other intervertebral disc degeneration, lumbar region: Secondary | ICD-10-CM | POA: Diagnosis not present

## 2018-09-25 DIAGNOSIS — M5416 Radiculopathy, lumbar region: Secondary | ICD-10-CM | POA: Diagnosis not present

## 2018-09-25 DIAGNOSIS — M9903 Segmental and somatic dysfunction of lumbar region: Secondary | ICD-10-CM | POA: Diagnosis not present

## 2018-09-25 DIAGNOSIS — M5415 Radiculopathy, thoracolumbar region: Secondary | ICD-10-CM | POA: Diagnosis not present

## 2018-09-26 DIAGNOSIS — M5415 Radiculopathy, thoracolumbar region: Secondary | ICD-10-CM | POA: Diagnosis not present

## 2018-09-26 DIAGNOSIS — M9905 Segmental and somatic dysfunction of pelvic region: Secondary | ICD-10-CM | POA: Diagnosis not present

## 2018-09-26 DIAGNOSIS — M9903 Segmental and somatic dysfunction of lumbar region: Secondary | ICD-10-CM | POA: Diagnosis not present

## 2018-09-26 DIAGNOSIS — F411 Generalized anxiety disorder: Secondary | ICD-10-CM | POA: Diagnosis not present

## 2018-09-26 DIAGNOSIS — M9904 Segmental and somatic dysfunction of sacral region: Secondary | ICD-10-CM | POA: Diagnosis not present

## 2018-09-26 DIAGNOSIS — M5416 Radiculopathy, lumbar region: Secondary | ICD-10-CM | POA: Diagnosis not present

## 2018-09-26 DIAGNOSIS — M5136 Other intervertebral disc degeneration, lumbar region: Secondary | ICD-10-CM | POA: Diagnosis not present

## 2018-09-30 DIAGNOSIS — M5136 Other intervertebral disc degeneration, lumbar region: Secondary | ICD-10-CM | POA: Diagnosis not present

## 2018-09-30 DIAGNOSIS — M9905 Segmental and somatic dysfunction of pelvic region: Secondary | ICD-10-CM | POA: Diagnosis not present

## 2018-09-30 DIAGNOSIS — M9904 Segmental and somatic dysfunction of sacral region: Secondary | ICD-10-CM | POA: Diagnosis not present

## 2018-09-30 DIAGNOSIS — M5415 Radiculopathy, thoracolumbar region: Secondary | ICD-10-CM | POA: Diagnosis not present

## 2018-09-30 DIAGNOSIS — M5416 Radiculopathy, lumbar region: Secondary | ICD-10-CM | POA: Diagnosis not present

## 2018-09-30 DIAGNOSIS — M9903 Segmental and somatic dysfunction of lumbar region: Secondary | ICD-10-CM | POA: Diagnosis not present

## 2018-10-01 DIAGNOSIS — M5136 Other intervertebral disc degeneration, lumbar region: Secondary | ICD-10-CM | POA: Diagnosis not present

## 2018-10-01 DIAGNOSIS — M9904 Segmental and somatic dysfunction of sacral region: Secondary | ICD-10-CM | POA: Diagnosis not present

## 2018-10-01 DIAGNOSIS — M5415 Radiculopathy, thoracolumbar region: Secondary | ICD-10-CM | POA: Diagnosis not present

## 2018-10-01 DIAGNOSIS — M5416 Radiculopathy, lumbar region: Secondary | ICD-10-CM | POA: Diagnosis not present

## 2018-10-01 DIAGNOSIS — M9903 Segmental and somatic dysfunction of lumbar region: Secondary | ICD-10-CM | POA: Diagnosis not present

## 2018-10-01 DIAGNOSIS — M9905 Segmental and somatic dysfunction of pelvic region: Secondary | ICD-10-CM | POA: Diagnosis not present

## 2018-10-03 DIAGNOSIS — M5136 Other intervertebral disc degeneration, lumbar region: Secondary | ICD-10-CM | POA: Diagnosis not present

## 2018-10-03 DIAGNOSIS — M9905 Segmental and somatic dysfunction of pelvic region: Secondary | ICD-10-CM | POA: Diagnosis not present

## 2018-10-03 DIAGNOSIS — M9903 Segmental and somatic dysfunction of lumbar region: Secondary | ICD-10-CM | POA: Diagnosis not present

## 2018-10-03 DIAGNOSIS — M5415 Radiculopathy, thoracolumbar region: Secondary | ICD-10-CM | POA: Diagnosis not present

## 2018-10-03 DIAGNOSIS — M5416 Radiculopathy, lumbar region: Secondary | ICD-10-CM | POA: Diagnosis not present

## 2018-10-03 DIAGNOSIS — M9904 Segmental and somatic dysfunction of sacral region: Secondary | ICD-10-CM | POA: Diagnosis not present

## 2018-10-11 DIAGNOSIS — F411 Generalized anxiety disorder: Secondary | ICD-10-CM | POA: Diagnosis not present

## 2018-10-14 DIAGNOSIS — M9903 Segmental and somatic dysfunction of lumbar region: Secondary | ICD-10-CM | POA: Diagnosis not present

## 2018-10-14 DIAGNOSIS — M5136 Other intervertebral disc degeneration, lumbar region: Secondary | ICD-10-CM | POA: Diagnosis not present

## 2018-10-14 DIAGNOSIS — M5415 Radiculopathy, thoracolumbar region: Secondary | ICD-10-CM | POA: Diagnosis not present

## 2018-10-14 DIAGNOSIS — M9904 Segmental and somatic dysfunction of sacral region: Secondary | ICD-10-CM | POA: Diagnosis not present

## 2018-10-14 DIAGNOSIS — M9905 Segmental and somatic dysfunction of pelvic region: Secondary | ICD-10-CM | POA: Diagnosis not present

## 2018-10-14 DIAGNOSIS — M5416 Radiculopathy, lumbar region: Secondary | ICD-10-CM | POA: Diagnosis not present

## 2018-11-01 DIAGNOSIS — F411 Generalized anxiety disorder: Secondary | ICD-10-CM | POA: Diagnosis not present

## 2018-11-06 DIAGNOSIS — D329 Benign neoplasm of meninges, unspecified: Secondary | ICD-10-CM | POA: Diagnosis not present

## 2018-11-11 DIAGNOSIS — R030 Elevated blood-pressure reading, without diagnosis of hypertension: Secondary | ICD-10-CM | POA: Diagnosis not present

## 2018-11-11 DIAGNOSIS — D329 Benign neoplasm of meninges, unspecified: Secondary | ICD-10-CM | POA: Diagnosis not present

## 2018-11-11 DIAGNOSIS — Z6827 Body mass index (BMI) 27.0-27.9, adult: Secondary | ICD-10-CM | POA: Diagnosis not present

## 2018-11-18 DIAGNOSIS — F411 Generalized anxiety disorder: Secondary | ICD-10-CM | POA: Diagnosis not present

## 2018-12-02 DIAGNOSIS — F411 Generalized anxiety disorder: Secondary | ICD-10-CM | POA: Diagnosis not present

## 2018-12-06 DIAGNOSIS — Z932 Ileostomy status: Secondary | ICD-10-CM | POA: Diagnosis not present

## 2018-12-20 DIAGNOSIS — F411 Generalized anxiety disorder: Secondary | ICD-10-CM | POA: Diagnosis not present

## 2019-01-20 DIAGNOSIS — F411 Generalized anxiety disorder: Secondary | ICD-10-CM | POA: Diagnosis not present

## 2019-01-24 DIAGNOSIS — Z932 Ileostomy status: Secondary | ICD-10-CM | POA: Diagnosis not present

## 2019-03-24 ENCOUNTER — Telehealth: Payer: Self-pay | Admitting: Neurology

## 2019-03-24 ENCOUNTER — Encounter: Payer: Self-pay | Admitting: Neurology

## 2019-03-24 NOTE — Telephone Encounter (Signed)
Called the patient to inform them that our office has placed new protocols in place for our office visits. Due to Covid 19 our office is reducing our number of office visits in order to minimize the risk to our patients and healthcare providers.Our office is now providing the capability to offer the patients virtual visits at this time. Informed of what that process looks like and informed that the Virtual visit will still be billed through insurance as such. Due to [place],informed the patient since the appointment is taking place over the phone/internet app, we can't guarantee the security of the phone line. With that said if we do move forward I would have to get verbal consent to complete the Video Visit/Phone call. Patient gave verbal consent to move forward with the phone visit. I have reviewed the patient's chart and made sure that everything is up to date. Patient is also made aware that since this is a video visit we are able to complete the visit but a physical exam is not able to be done since the patient is not present in person.  Pt informed that the front staff will contact the patient aprox 30 minutes prior to the scheduled appointment to "check them in" and make sure that everything is ready for the appointment to get started. Pt verbalized understanding of this information and will states to be ready for the visit at least 15-30 min prior to the visit.

## 2019-03-24 NOTE — Telephone Encounter (Signed)
Request faxed to [facility] regional requesting pt Ct scan. [phone]

## 2019-03-24 NOTE — Telephone Encounter (Signed)
RN Loma Sousa @ Dr Verdie Drown office(pt's pcp's office) has called stating that pt was in the hospital on 04-25 for worsening had tremors and was told to follow up with Neurologist again. RN Loma Sousa confirmed that she did not get verbal consent from pt to file insurance for a televisitconsent. RN Loma Sousa was told a message would be sent to providers RN about this to determine what type appointment would need to be offered to pt.

## 2019-03-31 ENCOUNTER — Encounter: Payer: Self-pay | Admitting: Neurology

## 2019-03-31 ENCOUNTER — Other Ambulatory Visit: Payer: Self-pay

## 2019-03-31 ENCOUNTER — Ambulatory Visit (INDEPENDENT_AMBULATORY_CARE_PROVIDER_SITE_OTHER): Payer: Medicare Other | Admitting: Neurology

## 2019-03-31 ENCOUNTER — Other Ambulatory Visit: Payer: Self-pay | Admitting: Neurology

## 2019-03-31 DIAGNOSIS — F419 Anxiety disorder, unspecified: Secondary | ICD-10-CM

## 2019-03-31 DIAGNOSIS — R251 Tremor, unspecified: Secondary | ICD-10-CM

## 2019-03-31 MED ORDER — PRIMIDONE 50 MG PO TABS: ORAL_TABLET | ORAL | 3 refills | Status: DC

## 2019-03-31 NOTE — Progress Notes (Signed)
NEUROLOGY CLINIC   Provider:  Larey Seat, M D  Primary Care Physician:  Greig Right, MD    Virtual Visit via Telephone Note  I connected with Cindy Dudley on 03/31/19 at  2:30 PM EDT by telephone and verified that I am speaking with the correct person using two identifiers.  Location: Patient: at home  Provider: at [facility]   I discussed the limitations, risks, security and privacy concerns of performing an evaluation and management service by telephone and the availability of in person appointments. I also discussed with the patient that there may be a patient responsible charge related to this service. The patient expressed understanding and agreed to proceed.   History of Present Illness: Cindy Dudley was referred to me in October 2019 also she had been an established patient of Dr. Gevena Cotton, DO.  She had a history of a meningioma resection -She has a history of a left frontoparietal temporal craniotomy. Our first  visit in October ended with my recommendation to have a repeat MRI and see if a recurrence of a meningioma occurred or not.  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   She had a mild tremor and I prescribed primidone Mysoline 50 mg tablets to be taken twice daily by mouth.  I asked her to continue taking her other medications.  My diagnosis was that of an essential tremor.    Apparently an MRI was performed in December 2019.  On 22 March 2019 at 6 PM she underwent a CT head without contrast through [facility] ED - in [place]. This showed a stable 12 mm millimeter mass consistent with meningioma in a location along the right anterior parasagittal frontal lobe.  In December 2019 she had undergone an MRI which showed other small enhancing masses which were not seen by this unenhanced CT.  Coincidentally there were scattered calcification at the carotid siphon noted.  She has a history of a left  frontoparietal temporal craniotomy.    Observations/Objective: " Iwoke up at 1 AM with body shaking - from the inside- head  turning and trembling, nausea and dizziness ( for over a year now !) Felt fatigued. Has vertigo. Presented to ED- 03-24-2019. Had high BP there - had  134/ 80 mmHg !!!!  EKG was regular.  On and off ever since. Uses a cane for the last month- but then continues to state this has been going on for 1- years.  Her voice appears hasty, trembling. She has reported lots of joint pain. She falls frequently. If she rises too fast from the bed she falls back.   ROC : trembling, vertigo, nausea. Feeling weak.   Assessment and Plan: Tremor was not worse according to Dr Harrell Lark. Betablocker was not filled - due to concern about low BP.   I prescribed Myseline and patient did not fill the medication., concerned about side effect.   Follow Up Instructions: the patient is not following medical advice and needs psychology to address stress anxiety, somatization or conversion disorder.    I discussed the assessment and treatment plan with the patient. The patient was provided an opportunity to ask questions and all were answered. The patient agreed with the plan and demonstrated an understanding of the instructions.   The patient was advised to call back or seek an in-person evaluation if the symptoms worsen or if the condition fails to improve as anticipated.  I provided 20 minutes  of non-face-to-face time during this encounter.    Larey Seat, MD    HPI:  09-18-2018 Patient of Dr Arturo Morton,  Cindy Dudley is a 68 y.o. female patient is accompanied by her husband who is a sleep patient - seen here as in a referral from Dr. Burnett Sheng / Dr. Ashok Pall to have a neurological opinion about meningeomata , having had a single tumor resection in 1994 in [place] , [place] and  had aftercare at [facility].  A couple of years ago she was seen at [facility], and was told she may have  had stroke. Her MRI was performed at [facility] and did not show any strokes, but her insurance couldn't cover care at [facility] [place]. She believes she was seen by a PA not MD.  Cindy Dudley presents today being worried that the meningioma time may have grown or causing other symptoms.    Medical history /family history: She is a meanwhile 68 year old Caucasian right-handed female, seen here in the presence of her husband, with a surgical history of cholecystectomy in 2016 hysterectomy in 1980, ileostomy in 1982, meningioma resection in 1994, fibroid tumor and ovarian cysts 2000.  She has GERD, ulcerative cystitis colitis with total colectomy, renal stones, osteoporosis with vitamin D deficiency, dysphagia, spasmodic dysphonia since 1980, she was diagnosed with glaucoma in May 2017.  There is a family history of 1 of her 3 sisters positive for arrhythmia, one sister has torticollis, her brother has hyperlipidemia and throat cancer.  Her mother died at age 56 cause of death was pneumonia she had hypertension, breast cancer, renal stones, dementia and glaucoma her father died at age 27 of heart disease but also had suffered from gout.   Chief complaint according to patient :  Patient believes she is gaining weight due to meningeoma, hearing loss.   Social history: married, lives independently, patient is able to keep her home, she cooks she drives she lives and dresses independently.  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   Review of Systems: Out of a complete 14 system review, the patient complains of only the following symptoms, and all other reviewed systems are negative.  Spasmodic dysphonia,  tremulous voice, hearing loss.   High anxiety- titubation, not torticollis.     Social History   Socioeconomic History   Marital status: Married    Spouse name: Not on file    Number of children: Not on file   Years of education: Not on file   Highest education level: Not on file  Occupational History   Not on file  Social Needs   Financial resource strain: Not on file   Food insecurity:    Worry: Not on file    Inability: Not on file   Transportation needs:    Medical: Not on file    Non-medical: Not on file  Tobacco Use   Smoking status: Never Smoker   Smokeless tobacco: Never Used  Substance and Sexual Activity   Alcohol use: No   Drug use: No   Sexual activity: Not on file  Lifestyle   Physical activity:    Days per week: Not on file    Minutes per session: Not on file   Stress: Not on file  Relationships   Social connections:    Talks on phone: Not on file    Gets  together: Not on file    Attends religious service: Not on file    Active member of club or organization: Not on file    Attends meetings of clubs or organizations: Not on file    Relationship status: Not on file   Intimate partner violence:    Fear of current or ex partner: Not on file    Emotionally abused: Not on file    Physically abused: Not on file    Forced sexual activity: Not on file  Other Topics Concern   Not on file  Social History Narrative   Lives home with husband.  Works for [employer].  Education college (6 months) for [employer].   Children 3.      Family History  Problem Relation Age of Onset   Cancer Mother        breast   Food Allergy Mother    Allergic rhinitis Mother    Dementia Mother    Hypertension Mother    Glaucoma Mother    Heart disease Father    Gout Father    Fibromyalgia Sister    Hyperlipidemia Brother    Throat cancer Brother    Cancer Paternal Aunt        colon   Diabetes Paternal Grandmother     Past Medical History:  Diagnosis Date   Anemia    Asthma    Blood transfusion    Brain tumor ([place]) 1995   Bruises easily    Bursitis    left knee   Colitis, ulcerative ([place]) 1969   Fatigue     Fibroid tumor 1982   Hearing loss    Kidney stone    Ovarian cyst 1981   Rash    Sore throat    Spasmodic dysphonia 1982   Spasmodic torticollis 2007   Ulcer    Wears glasses     Past Surgical History:  Procedure Laterality Date   ABDOMINAL HYSTERECTOMY  1982   BRAIN TUMOR EXCISION  1995   CHOLECYSTECTOMY  2016   ILEOSTOMY  1982   LARYNX SURGERY  2000   OVARIAN CYST REMOVAL      Current Outpatient Medications  Medication Sig Dispense Refill   Ascorbic Acid (VITAMIN C) 1000 MG tablet Take 1,000 mg by mouth daily.     Calcium Citrate (CITRACAL PO) Take 4 tablets by mouth daily.       Cholecalciferol (VITAMIN D PO) Take 4,500 Units by mouth daily.      GRAPE SEED EXTRACT PO Take by mouth.     IBU 600 MG tablet Take 600 mg by mouth as needed.      Lactobacillus Rhamnosus, GG, (RA PROBIOTIC DIGESTIVE CARE) CAPS Take by mouth.     latanoprost (XALATAN) 0.005 % ophthalmic solution      Magnesium 500 MG CAPS Take 1,000 mg by mouth daily.      NON FORMULARY as needed. True Focus. Obtain detail from patient regarding dosage, etc.     primidone (MYSOLINE) 50 MG tablet Start at bedtime for 8 days than take twice daily by mouth- in Am and in afternoon. (Patient not taking: Reported on 03/24/2019) 60 tablet 3   PROGESTERONE, VAGINAL, 8 % GEL Place vaginally.     UNABLE TO FIND Med Name:"Ionic Trace Minerals" - takes 1/2 tsp daily     No current facility-administered medications for this visit.     Allergies as of 03/31/2019 - Review Complete 03/24/2019  Allergen Reaction Noted   Latex Itching 02/01/2011  Sulfa antibiotics  10/11/2015   Phone conversation only- strongly suspect of non- organic origin.   Larey Seat, MD 03/28/7128, 2:90 PM  Certified in Neurology by ABPN Certified in [place] by [facility] [address], [place], [place] [address]

## 2019-03-31 NOTE — Patient Instructions (Signed)

## 2019-04-11 DIAGNOSIS — H401231 Low-tension glaucoma, bilateral, mild stage: Secondary | ICD-10-CM | POA: Diagnosis not present

## 2019-04-11 DIAGNOSIS — H353131 Nonexudative age-related macular degeneration, bilateral, early dry stage: Secondary | ICD-10-CM | POA: Diagnosis not present

## 2019-04-11 DIAGNOSIS — Z86018 Personal history of other benign neoplasm: Secondary | ICD-10-CM | POA: Diagnosis not present

## 2019-05-28 ENCOUNTER — Ambulatory Visit: Payer: Medicare Other | Admitting: Adult Health

## 2022-06-12 ENCOUNTER — Telehealth: Payer: Self-pay | Admitting: Neurology

## 2022-06-12 NOTE — Telephone Encounter (Signed)
Patient has been referred back to [place] by Dr. Harrell Lark for benign meningioma and tremor. She last saw Dr. Brett Fairy in 2020, but is requesting to switch her care over to Dr. Rexene Alberts. Would you both be ok with this?

## 2022-06-12 NOTE — Telephone Encounter (Signed)
I do not have anything different or new to offer, therefore, I decline the transfer.

## 2022-06-16 NOTE — Telephone Encounter (Addendum)
Patient had seen Dr Tat, was referred to [place] be Dr Burnett Sheng in 08-2018 for a second opinion and last seen here by me over 3 years ago- she would now be a NEW PATIENT.   Last encounter was a telephone note during the hight of the pandemic, on 03-31-2019.  She has spasmodic dysphonia, non parkinsonian tremors, history of meningeoma resection.    I prescribed Myseline and patient did not fill the medication either, concerned about side effects, but not informing me of her decision or concerns.    Follow Up Instructions: the patient was not following my medical advice and I felt she needs Behavior health support/ or a psychologist to address stress, anxiety, somatization or conversion disorder.   My relationship with the patient ended there - she was released from my care.   Larey Seat, MD

## 2022-06-19 NOTE — Telephone Encounter (Signed)
NO she was not dismissed, she can see you for torticollus- great

## 2022-08-07 ENCOUNTER — Encounter: Payer: Self-pay | Admitting: Neurology

## 2022-08-07 ENCOUNTER — Ambulatory Visit: Payer: Medicare Other | Admitting: Neurology

## 2022-08-07 VITALS — BP 127/73 | HR 83 | Ht 59.0 in | Wt 155.0 lb

## 2022-08-07 DIAGNOSIS — D320 Benign neoplasm of cerebral meninges: Secondary | ICD-10-CM

## 2022-08-07 DIAGNOSIS — H9193 Unspecified hearing loss, bilateral: Secondary | ICD-10-CM | POA: Diagnosis not present

## 2022-08-07 DIAGNOSIS — R260 Ataxic gait: Secondary | ICD-10-CM | POA: Diagnosis not present

## 2022-08-07 NOTE — Progress Notes (Signed)
Chief Complaint  Patient presents with   New Patient (Initial Visit)    Rm 12. Alone. NX Doh LV 202/Paper/[facility]/Heather Spry MD/benign meningioma, tremor.      ASSESSMENT AND PLAN  Cindy Dudley is a 71 y.o. female   History of meningioma resection in 1995 at [place]  MRI of the brain with without contrast  CMP Head titubation  Family history of essential tremor, she also has mild action tremor in both hands,  Might benefit Botox injection, but she is on anticoagulation due to history of pulmonary emboli, does not want to consider treatment at this point  DIAGNOSTIC DATA (LABS, IMAGING, TESTING) - I reviewed patient records, labs, notes, testing and imaging myself where available.   MEDICAL HISTORY:  Cindy Dudley, is a 71 year old female seen in request by primary care physician Dr. Verdell Carmine.,  to  follow-up for meningioma, initial evaluation was on August 07, 2022  I reviewed and summarized the referring note. PMHX. 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, last record I can find in epic system was CT head without contrast March 22, 2019 at [facility] system, describes stable 12 mm mass along the right anterior parasagittal frontal lobe, consistent with meningioma, radiology has compared the film to previous imaging MRI December 2019, CT scan May 2005,   She also has a long history of head titubation, started around the year she had a craniotomy, she has intermittent bilateral hands tremor, previously had a history of spastic dysphonia, had  laryngeal surgery in [place] in 2000, responded very well,   Paternal uncle has tremor, carried a diagnosis of Parkinson's disease,.  Her son has intermittent bilateral hands tremor  History of pulmonary emboli on anticoagulation treatment  PHYSICAL EXAM:   Vitals:   08/07/22 1538  BP: 127/73  Pulse: 83  Weight: 155 lb (70.3 kg)  Height: '4\' 11"'$  (1.499 m)   Not recorded     Body mass index is 31.31 kg/m.  PHYSICAL EXAMNIATION:  Gen: NAD, conversant, well nourised, well groomed                     Cardiovascular: Regular rate rhythm, no peripheral edema, warm, nontender. Eyes: Conjunctivae clear without exudates or hemorrhage Neck: Supple, no carotid bruits. Pulmonary: Clear to auscultation bilaterally   NEUROLOGICAL EXAM:  MENTAL STATUS: Speech/cognition: Awake, alert, oriented to history taking and casual conversation CRANIAL NERVES: CN II: Visual fields are full to confrontation. Pupils are round equal and briskly reactive to light. CN III, IV, VI: extraocular movement are normal. No ptosis. CN V: Facial sensation is intact to light touch CN VII: Face is symmetric with normal eye closure  CN VIII: Hearing is normal to causal conversation. CN IX, X: Phonation is normal. CN XI: Head turning and shoulder shrug are intact  MOTOR: There is no pronator drift of out-stretched arms. Muscle bulk and tone are normal. Muscle strength is normal.  REFLEXES: Reflexes are 2+ and symmetric at the biceps, triceps, knees, and ankles. Plantar responses are flexor.  SENSORY: Intact to light touch,  pinprick and vibratory sensation are intact in fingers and toes.  COORDINATION: There is no trunk or limb dysmetria noted.  GAIT/STANCE: Posture is normal. Gait is steady with normal steps, base, arm swing, and turning. Heel and toe walking are normal. Tandem gait is normal.  Romberg is absent.  REVIEW OF SYSTEMS:  Full 14 system review of systems performed and notable only for  as above All other review of systems were negative.   ALLERGIES: Allergies  Allergen Reactions   Latex Itching    Per patient   Sulfa Antibiotics     OTHER REACTION: BLOATING    HOME MEDICATIONS: Current Outpatient Medications  Medication Sig Dispense Refill   Ascorbic Acid (VITAMIN C) 1000 MG tablet Take 1,000 mg by mouth daily.     CALCIUM CITRATE PO Take 1 tablet by mouth daily.     Cholecalciferol 50 MCG (2000 UT) TABS Take 2,000 Units by mouth daily.     ELIQUIS 5 MG TABS tablet Take 5 mg by mouth 2 (two) times daily.     lactase (LACTAID) 3000 units tablet Take 500 Units by mouth 3 (three) times daily with meals.     latanoprost (XALATAN) 0.005 % ophthalmic solution      MAGNESIUM CITRATE PO Take 150 mg by mouth 3 (three) times daily.     Misc Natural Products (PROGESTERONE EX) Apply 1 Application topically.     UNABLE TO FIND Take 1 capsule by mouth daily. Med Name: [place] TO FIND Med Name: algae cal plus boost     No current facility-administered medications for this visit.    PAST MEDICAL HISTORY: Past Medical History:  Diagnosis Date   Anemia    Asthma    Blood transfusion    Brain tumor ([place]) 1995   Bruises easily    Bursitis    left knee   Colitis, ulcerative ([place]) 1969   Fatigue    Fibroid tumor 1982   Hearing loss    Kidney stone    Ovarian cyst 1981   Rash    Sore throat    Spasmodic dysphonia 1982   Spasmodic torticollis 2007   Ulcer    Wears glasses     PAST SURGICAL HISTORY: Past Surgical History:  Procedure Laterality Date   ABDOMINAL HYSTERECTOMY  1982   BRAIN TUMOR EXCISION  1995   CHOLECYSTECTOMY  2016   ILEOSTOMY  1982   LARYNX SURGERY  2000   OVARIAN CYST REMOVAL      FAMILY HISTORY: Family History  Problem Relation Age of Onset   Cancer Mother        breast   Food Allergy Mother    Allergic rhinitis Mother    Dementia Mother    Hypertension Mother    Glaucoma Mother    Heart disease  Father    Gout Father    Fibromyalgia Sister    Hyperlipidemia Brother    Throat cancer Brother    Cancer Paternal Aunt        colon   Diabetes Paternal Grandmother     SOCIAL HISTORY: Social History   Socioeconomic History   Marital status: Married    Spouse name: Not on file   Number of children: Not on file   Years of education: Not on file   Highest education level: Not on file  Occupational History   Not on file  Tobacco Use   Smoking status: Never   Smokeless tobacco: Never  Substance and Sexual Activity   Alcohol use: No   Drug use: No   Sexual activity: Not on file  Other Topics Concern   Not on file  Social History Narrative   Lives home with husband.  Works for [employer].  Education college (6 months) for [employer].   Children 3.     Social Determinants of Health   Financial Resource Strain: Not on file  Food Insecurity: Not on file  Transportation Needs: Not on file  Physical Activity: Not on file  Stress: Not on file  Social Connections: Not on file  Intimate Partner Violence: Not on file      Marcial Pacas, M.D. Ph.D.  [facility] [address], [place], [place] [address] Ph: [phone] Fax: [phone]  CC:  Verdell Carmine., MD [place],  [place] [address]  Verdell Carmine., MD

## 2022-08-08 ENCOUNTER — Telehealth: Payer: Self-pay | Admitting: Neurology

## 2022-08-08 LAB — CBC WITH DIFFERENTIAL/PLATELET
Basophils Absolute: 0 10*3/uL (ref 0.0–0.2)
Basos: 0 %
EOS (ABSOLUTE): 0.1 10*3/uL (ref 0.0–0.4)
Eos: 2 %
Hematocrit: 42.1 % (ref 34.0–46.6)
Hemoglobin: 14 g/dL (ref 11.1–15.9)
Immature Grans (Abs): 0 10*3/uL (ref 0.0–0.1)
Immature Granulocytes: 0 %
Lymphocytes Absolute: 1.1 10*3/uL (ref 0.7–3.1)
Lymphs: 12 %
MCH: 31 pg (ref 26.6–33.0)
MCHC: 33.3 g/dL (ref 31.5–35.7)
MCV: 93 fL (ref 79–97)
Monocytes Absolute: 0.8 10*3/uL (ref 0.1–0.9)
Monocytes: 8 %
Neutrophils Absolute: 7.1 10*3/uL — ABNORMAL HIGH (ref 1.4–7.0)
Neutrophils: 78 %
Platelets: 315 10*3/uL (ref 150–450)
RBC: 4.51 x10E6/uL (ref 3.77–5.28)
RDW: 11.8 % (ref 11.7–15.4)
WBC: 9.2 10*3/uL (ref 3.4–10.8)

## 2022-08-08 LAB — COMPREHENSIVE METABOLIC PANEL
ALT: 21 IU/L (ref 0–32)
AST: 21 IU/L (ref 0–40)
Albumin/Globulin Ratio: 1.9 (ref 1.2–2.2)
Albumin: 4.1 g/dL (ref 3.8–4.8)
Alkaline Phosphatase: 108 IU/L (ref 44–121)
BUN/Creatinine Ratio: 9 — ABNORMAL LOW (ref 12–28)
BUN: 8 mg/dL (ref 8–27)
Bilirubin Total: 0.5 mg/dL (ref 0.0–1.2)
CO2: 24 mmol/L (ref 20–29)
Calcium: 9.2 mg/dL (ref 8.7–10.3)
Chloride: 101 mmol/L (ref 96–106)
Creatinine, Ser: 0.88 mg/dL (ref 0.57–1.00)
Globulin, Total: 2.2 g/dL (ref 1.5–4.5)
Glucose: 99 mg/dL (ref 70–99)
Potassium: 4.3 mmol/L (ref 3.5–5.2)
Sodium: 140 mmol/L (ref 134–144)
Total Protein: 6.3 g/dL (ref 6.0–8.5)
eGFR: 70 mL/min/{1.73_m2} (ref >59)

## 2022-08-08 LAB — TSH: TSH: 1.9 u[IU]/mL (ref 0.450–4.500)

## 2022-08-08 NOTE — Telephone Encounter (Signed)
[employer] medicare NPR sent to GI 

## 2022-08-23 ENCOUNTER — Ambulatory Visit
Admission: RE | Admit: 2022-08-23 | Discharge: 2022-08-23 | Disposition: A | Discharge status: Home / Self Care | Payer: Medicare Other | Source: Ambulatory Visit | Attending: Neurology | Admitting: Neurology

## 2022-08-23 DIAGNOSIS — H9193 Unspecified hearing loss, bilateral: Secondary | ICD-10-CM

## 2022-08-23 DIAGNOSIS — D320 Benign neoplasm of cerebral meninges: Secondary | ICD-10-CM

## 2022-08-23 DIAGNOSIS — R260 Ataxic gait: Secondary | ICD-10-CM

## 2022-08-23 MED ORDER — GADOBENATE DIMEGLUMINE 529 MG/ML IV SOLN
14.0000 mL | Freq: Once | INTRAVENOUS | Status: AC | PRN
  Administered 2022-08-23: 14 mL via INTRAVENOUS

## 2022-08-25 ENCOUNTER — Telehealth: Payer: Self-pay | Admitting: Neurology

## 2022-08-25 NOTE — Telephone Encounter (Signed)
Please call patient MRI of the brain showed right anterior frontal area meningioma, 18 x 14 mm, there was also 2 smaller meningioma on the left side, no change in treatment plan  If she has strings about her scan, may arrange virtual visit   IMPRESSION: This MRI of the brain with and without contrast shows the following: Stable appearance of 3 enhancing extra-axial dural based masses consistent with meningiomas.  These are in the anterior right parafalcine region (18 x 14 mm), adjacent to the left anterior clinoid process (8 mm) and anterior to the left temporal lobe (8 mm) compared to the MRI from 11/03/2020, they are stable in size and appearance. The brain appears normal for age. Left maxillary chronic sinusitis No acute findings.

## 2022-08-30 NOTE — Telephone Encounter (Signed)
LVM informing patient Dr Krista Blue stated MRI of the brain showed right anterior frontal area meningioma, 18 x 14 mm, there was also 2 smaller meningioma on the left side, no change in treatment plan   If she has concerns about her scan, may arrange virtual visit. I left #, advised she call and phone staff can schedule virtual visit if she wants to discuss further.Cindy Dudley

## 2023-06-11 ENCOUNTER — Emergency Department (HOSPITAL_COMMUNITY): Payer: Medicare Other

## 2023-06-11 ENCOUNTER — Emergency Department (HOSPITAL_COMMUNITY)
Admission: EM | Admit: 2023-06-11 | Discharge: 2023-06-12 | Disposition: A | Discharge status: Other Acute Inpatient Hospital | Payer: Medicare Other | Attending: Emergency Medicine | Admitting: Emergency Medicine

## 2023-06-11 ENCOUNTER — Other Ambulatory Visit: Payer: Self-pay

## 2023-06-11 DIAGNOSIS — I509 Heart failure, unspecified: Secondary | ICD-10-CM | POA: Diagnosis not present

## 2023-06-11 DIAGNOSIS — R251 Tremor, unspecified: Secondary | ICD-10-CM | POA: Insufficient documentation

## 2023-06-11 DIAGNOSIS — R052 Subacute cough: Secondary | ICD-10-CM | POA: Diagnosis not present

## 2023-06-11 DIAGNOSIS — Z91040 Latex allergy status: Secondary | ICD-10-CM | POA: Insufficient documentation

## 2023-06-11 DIAGNOSIS — Z86711 Personal history of pulmonary embolism: Secondary | ICD-10-CM | POA: Diagnosis not present

## 2023-06-11 DIAGNOSIS — Z85841 Personal history of malignant neoplasm of brain: Secondary | ICD-10-CM | POA: Insufficient documentation

## 2023-06-11 DIAGNOSIS — R188 Other ascites: Secondary | ICD-10-CM

## 2023-06-11 DIAGNOSIS — R0602 Shortness of breath: Secondary | ICD-10-CM | POA: Diagnosis not present

## 2023-06-11 DIAGNOSIS — K56609 Unspecified intestinal obstruction, unspecified as to partial versus complete obstruction: Secondary | ICD-10-CM

## 2023-06-11 DIAGNOSIS — K439 Ventral hernia without obstruction or gangrene: Secondary | ICD-10-CM | POA: Diagnosis not present

## 2023-06-11 DIAGNOSIS — R109 Unspecified abdominal pain: Secondary | ICD-10-CM | POA: Diagnosis present

## 2023-06-11 DIAGNOSIS — R000 Tachycardia, unspecified: Secondary | ICD-10-CM | POA: Diagnosis not present

## 2023-06-11 DIAGNOSIS — Z7901 Long term (current) use of anticoagulants: Secondary | ICD-10-CM | POA: Diagnosis not present

## 2023-06-11 LAB — URINALYSIS, ROUTINE W REFLEX MICROSCOPIC
Bilirubin Urine: NEGATIVE
Glucose, UA: NEGATIVE mg/dL
Hgb urine dipstick: NEGATIVE
Ketones, ur: NEGATIVE mg/dL
Leukocytes,Ua: NEGATIVE
Nitrite: NEGATIVE
Protein, ur: NEGATIVE mg/dL
Specific Gravity, Urine: 1.006 (ref 1.005–1.030)
pH: 6 (ref 5.0–8.0)

## 2023-06-11 LAB — CBC
HCT: 37.9 % (ref 36.0–46.0)
Hemoglobin: 12.1 g/dL (ref 12.0–15.0)
MCH: 29.2 pg (ref 26.0–34.0)
MCHC: 31.9 g/dL (ref 30.0–36.0)
MCV: 91.3 fL (ref 80.0–100.0)
Platelets: 363 10*3/uL (ref 150–400)
RBC: 4.15 MIL/uL (ref 3.87–5.11)
RDW: 15.3 % (ref 11.5–15.5)
WBC: 11.4 10*3/uL — ABNORMAL HIGH (ref 4.0–10.5)
nRBC: 0 % (ref 0.0–0.2)

## 2023-06-11 LAB — COMPREHENSIVE METABOLIC PANEL
ALT: 25 U/L (ref 0–44)
AST: 28 U/L (ref 15–41)
Albumin: 3.2 g/dL — ABNORMAL LOW (ref 3.5–5.0)
Alkaline Phosphatase: 90 U/L (ref 38–126)
Anion gap: 11 (ref 5–15)
BUN: 6 mg/dL — ABNORMAL LOW (ref 8–23)
CO2: 22 mmol/L (ref 22–32)
Calcium: 9.1 mg/dL (ref 8.9–10.3)
Chloride: 102 mmol/L (ref 98–111)
Creatinine, Ser: 0.76 mg/dL (ref 0.44–1.00)
GFR, Estimated: >60 mL/min (ref >60)
Glucose, Bld: 133 mg/dL — ABNORMAL HIGH (ref 70–99)
Potassium: 4 mmol/L (ref 3.5–5.1)
Sodium: 135 mmol/L (ref 135–145)
Total Bilirubin: 0.6 mg/dL (ref 0.3–1.2)
Total Protein: 6.7 g/dL (ref 6.5–8.1)

## 2023-06-11 LAB — LIPASE, BLOOD: Lipase: 25 U/L (ref 11–51)

## 2023-06-11 LAB — TROPONIN I (HIGH SENSITIVITY): Troponin I (High Sensitivity): 7 ng/L (ref <18)

## 2023-06-11 LAB — LACTIC ACID, PLASMA: Lactic Acid, Venous: 1.7 mmol/L (ref 0.5–1.9)

## 2023-06-11 LAB — BRAIN NATRIURETIC PEPTIDE: B Natriuretic Peptide: 75.3 pg/mL (ref 0.0–100.0)

## 2023-06-11 MED ORDER — SODIUM CHLORIDE 0.9 % IV SOLN: 2.0000 g | Freq: Once | INTRAVENOUS | Status: DC

## 2023-06-11 MED ORDER — PIPERACILLIN-TAZOBACTAM 3.375 G IVPB
3.3750 g | Freq: Three times a day (TID) | INTRAVENOUS | Status: DC
  Administered 2023-06-11: 3.375 g via INTRAVENOUS
  Filled 2023-06-11: qty 50

## 2023-06-11 MED ORDER — METRONIDAZOLE 500 MG/100ML IV SOLN: 500.0000 mg | Freq: Once | INTRAVENOUS | Status: DC

## 2023-06-11 MED ORDER — DIATRIZOATE MEGLUMINE & SODIUM 66-10 % PO SOLN
90.0000 mL | Freq: Once | ORAL | Status: DC
  Filled 2023-06-11: qty 90

## 2023-06-11 MED ORDER — IOHEXOL 350 MG/ML SOLN
75.0000 mL | Freq: Once | INTRAVENOUS | Status: AC | PRN
  Administered 2023-06-11: 75 mL via INTRAVENOUS

## 2023-06-11 MED ORDER — LACTATED RINGERS IV SOLN: INTRAVENOUS | Status: DC

## 2023-06-11 MED ORDER — FENTANYL CITRATE PF 50 MCG/ML IJ SOSY
50.0000 ug | PREFILLED_SYRINGE | Freq: Once | INTRAMUSCULAR | Status: AC
  Administered 2023-06-11: 50 ug via INTRAVENOUS
  Filled 2023-06-11: qty 1

## 2023-06-11 NOTE — ED Triage Notes (Signed)
Pt reports cough x 5 days and was dx with bronchitis yesterday at [facility] in [place]. Was given prednisone, abx, and an inhaler yesterday, but she continues to wheeze. Pt also having pain in abdomen around her ostomy when she coughs and is having decreased output. Patient also adds she had abdominal surgery at [facility] two months ago and since then has had sob. At that time was told she has a heart problem as well but has been unable to see a cardiologist yet.

## 2023-06-11 NOTE — ED Provider Notes (Incomplete)
[facility] AT [facility] Provider Note   CSN: [id number] Arrival date & time: 06/11/23  1048     History Chief Complaint  Patient presents with  . Cough    Cindy Dudley is a 72 y.o. female with h/o PE on Eliquis, acute GI bleed, HFpEF, tremor, meningioma of the brain, HLD presents to the ER for evaluation of multiple concerns. The patient reports that she has had a occasionally productive cough for the past 5 days and was diagnosed with bronchitis yesterday at urgent care and was placed on prednisone, antibiotic, as well as an inhaler but still continues to "wheeze".  She reports that she has been feeling shortness of breath since she had her abdominal surgery in May.  Reports that the shortness of breath is not exertional nor does it happen randomly, just happens whenever she is talking on the phone.  Daughter-in-law was concerned because as they were on the phone today, patient took a deep breath and almost like she could not catch her breath however was then normal after that.  The patient has an ostomy bag from colitis since the 1980s.  She reports that she has been having green output which was concerning for her as she is usually had brown, normal-appearing stools. She did have   Green output present today.  She denies any nausea or vomiting.  She reports that 3 days into her coughing she started to have pain in her right upper abdomen.  Reports some relief with heating pad.  Additionally, she mentions that she has been having lower abdominal swelling since her surgery in May.  She is followed up with her [facility] team multiple times about this and reports this is typical postsurgery.  Additionally, she has been worked up for her shortness of breath after surgery as well.  She was told that she has a "heart problem" but is not able to see a cardiologist for the next few months.  She currently denies any chest pain or any palpitations.  Denies any dysuria or any  hematuria.  She denies any fevers or chills.  She reports that her abdominal pain only hurts never she is coughing otherwise feels okay.  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  When she was discharged home close she was placed on Lovenox and then was put on Eliquis 2.5 twice daily instead of her daily 5.  Husband at bedside reports that they are trying to discontinue this given that she only had a blood clot once and was unprovoked.  They do have a appointment with the heme-onc provider however this on till December.   Cough Associated symptoms: shortness of breath   Associated symptoms: no chest pain, no chills, no fever and no rhinorrhea        Home Medications Prior to Admission medications   Medication Sig Start Date End Date Taking? Authorizing Provider  Ascorbic Acid (VITAMIN C) 1000 MG tablet Take 1,000 mg by mouth daily.    [provider]  CALCIUM CITRATE PO Take 1 tablet by mouth daily.    [provider]  Cholecalciferol 50 MCG (2000 UT) TABS Take 2,000 Units by mouth daily.    [provider]  ELIQUIS 5 MG TABS tablet Take 5 mg by mouth 2 (two) times daily. 07/13/22   [provider]  lactase (LACTAID) 3000 units tablet Take 500  Units by mouth 3 (three) times daily with meals.    [provider]  latanoprost (XALATAN) 0.005 % ophthalmic solution  03/13/18   [provider]  MAGNESIUM CITRATE PO Take 150 mg by mouth 3 (three) times daily.    [provider]  Misc Natural Products (PROGESTERONE EX) Apply 1 Application topically.    [provider]  UNABLE TO FIND Take 1 capsule by mouth daily. Med Name: RA Probitoic Digestive Care    [provider]  UNABLE TO FIND Med Name: algae cal plus boost    [provider]      Allergies    Latex and Sulfa antibiotics    Review of Systems    Review of Systems  Constitutional:  Negative for chills and fever.  HENT:  Negative for congestion and rhinorrhea.   Respiratory:  Positive for cough and shortness of breath.   Cardiovascular:  Negative for chest pain and palpitations.  Gastrointestinal:  Positive for abdominal pain. Negative for nausea and vomiting.       Loose, green ostomy output.   Genitourinary:  Negative for dysuria and hematuria.    Physical Exam Updated Vital Signs BP (!) 150/66   Pulse (!) 127   Temp 100 F (37.8 C)   Resp 18   Ht 4\' 10"  (1.473 m)   Wt 71 kg   SpO2 95%   BMI 32.71 kg/m  Physical Exam Vitals and nursing note reviewed.  Constitutional:      General: She is not in acute distress.    Appearance: She is not ill-appearing or toxic-appearing.  HENT:     Mouth/Throat:     Mouth: Mucous membranes are moist.  Cardiovascular:     Rate and Rhythm: Tachycardia present.  Pulmonary:     Effort: Pulmonary effort is normal. No respiratory distress.     Comments: Lung sounds slightly diminished at the bases however do not appreciate any wheezing.  No stridor.  She is speaking in full sentences, satting well room air without increased work of breathing. Abdominal:     Palpations: Abdomen is soft.     Tenderness: There is abdominal tenderness. There is no guarding or rebound.     Comments: The patient does have a long vertical midline abdominal incision that does appear to be healing well.  Ostomy present in the right sided abdomen.  Has more tenderness to the left side of her abdomen however only has pain in the right upper quadrant only when coughing.  Right upper quadrant is nontender to palpation.  From her ostomy output, there is green stool however there are spinach leaves present in the ostomy bag.  I do not appreciate any melena or any hematochezia.  At the more inferior aspect of the surgical incision, overlying the patient's mons pubis there is significant induration as well as overlying warmth  and erythema.  Do not appreciate any fluctuance.  Patient reports that she has had some numbness there since surgery.  Musculoskeletal:     Right lower leg: No edema.     Left lower leg: No edema.  Skin:    General: Skin is warm and dry.  Neurological:     General: No focal deficit present.     Mental Status: She is alert.     Cranial Nerves: No cranial nerve deficit.     Motor: Tremor present.     Gait: Gait normal.     ED Results / Procedures / Treatments   Labs (  all labs ordered are listed, but only abnormal results are displayed) Labs Reviewed  COMPREHENSIVE METABOLIC PANEL - Abnormal; Notable for the following components:      Result Value   Glucose, Bld 133 (*)    BUN 6 (*)    Albumin 3.2 (*)    All other components within normal limits  CBC - Abnormal; Notable for the following components:   WBC 11.4 (*)    All other components within normal limits  URINALYSIS, ROUTINE W REFLEX MICROSCOPIC - Abnormal; Notable for the following components:   APPearance HAZY (*)    All other components within normal limits  CULTURE, BLOOD (ROUTINE X 2)  CULTURE, BLOOD (ROUTINE X 2)  LIPASE, BLOOD  BRAIN NATRIURETIC PEPTIDE  LACTIC ACID, PLASMA  LACTIC ACID, PLASMA  TSH  TROPONIN I (HIGH SENSITIVITY)    EKG EKG Interpretation Date/Time:  Monday June 11 2023 11:24:04 EDT Ventricular Rate:  110 PR Interval:  124 QRS Duration:  76 QT Interval:  318 QTC Calculation: 430 R Axis:   78  Text Interpretation: Sinus tachycardia Otherwise normal ECG No previous ECGs available No acute changes No old tracing to compare Confirmed by Derwood Kaplan [phone]) on 06/11/2023 1:46:26 PM  Radiology CT ABDOMEN PELVIS W CONTRAST  Result Date: 06/11/2023 CLINICAL DATA:  Abdominal pain EXAM: CT ABDOMEN AND PELVIS WITH CONTRAST TECHNIQUE: Multidetector CT imaging of the abdomen and pelvis was performed using the standard protocol following bolus administration of intravenous contrast. RADIATION DOSE  REDUCTION: This exam was performed according to the departmental dose-optimization program which includes automated exposure control, adjustment of the mA and/or kV according to patient size and/or use of iterative reconstruction technique. CONTRAST:  75mL OMNIPAQUE IOHEXOL 350 MG/ML SOLN COMPARISON:  03/22/2023 FINDINGS: Lower chest: Small linear patchy densities are seen in lower lung fields, more so on the right side. Hepatobiliary: Surgical clips are seen in gallbladder fossa. A 7 mm low-density in the inferior right lobe with no interval change. There is no dilation of bile ducts. Pancreas: No focal abnormalities are seen. Spleen: Unremarkable. Adrenals/Urinary Tract: Adrenals are unremarkable. There is no hydronephrosis. Parapelvic cysts are seen in both kidneys. There is 6 mm cortical cyst in the lower pole of right kidney. There are no renal or ureteral stones. Urinary bladder is not distended. Stomach/Bowel: Stomach is not distended. Small bowel loops are not dilated. Ileostomy is seen in right lower abdomen. There is previous colectomy. Vascular/Lymphatic: Scattered arterial calcifications are seen. Reproductive: Uterus is not seen. There is fluid collection measuring 13.1 x 9.1 cm in size filling the pelvic cavity. This may be part of ascites or ovarian mass. No definite demonstrable thick internal septations or nodules are seen.There are multiple surgical clips in the posteroinferior aspect of pelvis without significant change. Other: There is interval appearance of small to moderate ascites. There is ventral hernia containing short segment of small bowel loop in anterior abdominal wall close to midline to the left of umbilicus. There is 5.1 x 2.9 cm loculated fluid collection in this ventral hernia. There is no pneumoperitoneum. There is 6.7 x 4.2 cm loculated fluid collection in subcutaneous plane in suprapubic region with thick wall. There are no pockets of air within this fluid collection. There is  stranding in the fat planes and mons pubis. Musculoskeletal: No acute findings are seen. IMPRESSION: There is no evidence of intestinal obstruction or pneumoperitoneum. There is no hydronephrosis. There is interval appearance of small to moderate ascites. Large amount of fluid is seen in  pelvic cavity which may be cystic mass in 1 of the ovaries or loculated ascites. Previous colectomy with ileostomy in right lower abdomen. There is new loculated fluid collection in subcutaneous plane in the left paraumbilical region in anterior abdominal wall which may be loculated ascites or some other inflammatory or neoplastic process. There is 6.7 x 4.2 cm loculated thick-walled fluid collection in subcutaneous plane in suprapubic region with adjacent stranding. Possibility of an abscess is not excluded. Sonogram and aspiration as clinically warranted may be considered. There is a ventral hernia containing short segment of small bowel loop in the left paraumbilical region without signs of incarceration or obstruction. Electronically Signed   By: Ernie Avena M.D.   On: 06/11/2023 17:39   CT Angio Chest PE W and/or Wo Contrast  Result Date: 06/11/2023 CLINICAL DATA:  Cough, difficulty breathing EXAM: CT ANGIOGRAPHY CHEST WITH CONTRAST TECHNIQUE: Multidetector CT imaging of the chest was performed using the standard protocol during bolus administration of intravenous contrast. Multiplanar CT image reconstructions and MIPs were obtained to evaluate the vascular anatomy. RADIATION DOSE REDUCTION: This exam was performed according to the departmental dose-optimization program which includes automated exposure control, adjustment of the mA and/or kV according to patient size and/or use of iterative reconstruction technique. CONTRAST:  75mL OMNIPAQUE IOHEXOL 350 MG/ML SOLN COMPARISON:  05/09/2023 FINDINGS: Cardiovascular: There is homogeneous enhancement of the thoracic aorta. There are no intraluminal filling defects in  pulmonary artery branches. Small coronary artery calcifications are seen. Calcifications are seen in thoracic aorta and its major branches. Mediastinum/Nodes: No significant lymphadenopathy seen. There is ill-defined 1.5 cm low-density nodule in the left lobe of thyroid. Lungs/Pleura: Small patchy infiltrates are seen in posterior lower lung fields, more so on the right side. There is no focal pulmonary consolidation. There is calcified granuloma in right upper lobe. There is no pleural effusion or pneumothorax. Upper Abdomen: Ascites is present in the abdomen. Musculoskeletal: Degenerative changes are noted in thoracic spine. Review of the MIP images confirms the above findings. IMPRESSION: There is no evidence of pulmonary artery embolism. There is no evidence of thoracic aortic dissection. Aortic arteriosclerosis. Small patchy infiltrates are seen in posterior lower lung fields suggesting scarring or subsegmental atelectasis. There is no focal consolidation. There is no pleural effusion or pneumothorax. Electronically Signed   By: Ernie Avena M.D.   On: 06/11/2023 17:17   DG Chest 2 View  Result Date: 06/11/2023 CLINICAL DATA:  Cough for 5 days. EXAM: CHEST - 2 VIEW COMPARISON:  Chest radiograph 05/09/2019 FINDINGS: The cardiomediastinal silhouette is normal There is no focal consolidation or pulmonary edema. There is no pleural effusion or pneumothorax There is no acute osseous abnormality. Upper abdominal surgical clips are noted. IMPRESSION: No radiographic evidence of acute cardiopulmonary process. Electronically Signed   By: Lesia Hausen M.D.   On: 06/11/2023 11:48    Procedures Procedures   Medications Ordered in ED Medications  lactated ringers infusion (has no administration in time range)  piperacillin-tazobactam (ZOSYN) IVPB 3.375 g (3.375 g Intravenous New Bag/Given 06/11/23 2344)  iohexol (OMNIPAQUE) 350 MG/ML injection 75 mL (75 mLs Intravenous Contrast Given 06/11/23 1658)   fentaNYL (SUBLIMAZE) injection 50 mcg (50 mcg Intravenous Given 06/11/23 2342)    ED Course/ Medical Decision Making/ A&P Clinical Course as of 06/11/23 2346  Mon Jun 11, 2023  1553 Called CT. They report that she is next in line. [RR]  2018 [facility] line has been consulted twice by secretary without call back. Will page again.  [  RR]  2137 Lezlie Lye,  [RR]  2150 On the phone with the transfer center.  [RR]    Clinical Course User Index [RR] Achille Rich, PA-C                            Medical Decision Making Amount and/or Complexity of Data Reviewed Labs: ordered. Radiology: ordered.  Risk Prescription drug management.   72 y.o. female presents to the ER for evaluation of ***. Differential diagnosis includes but is not limited to ***. Vital signs ***. Physical exam as noted above.   I independently reviewed and interpreted the patient's labs. ***.  CT Angio PE shows ***  CT Abd/Pelvis shows there is no evidence of intestinal obstruction or pneumoperitoneum. There is no hydronephrosis. There is interval appearance of small to moderate ascites. Large amount of fluid is seen in pelvic cavity which may be cystic mass in 1 of the ovaries or loculated ascites. Previous colectomy with ileostomy in right lower abdomen. There is new loculated fluid collection in subcutaneous plane in the left paraumbilical region in anterior abdominal wall which may be loculated ascites or some other inflammatory or neoplastic process. There is 6.7 x 4.2 cm loculated thick-walled fluid collection in subcutaneous plane in suprapubic region with adjacent stranding. Possibility of an abscess is not excluded. Sonogram and aspiration as clinically warranted may be considered. There is a ventral hernia containing short segment of small bowel loop in the left paraumbilical region without signs of incarceration or obstruction.  [employer] multiple times to [facility] line trying to get a hold of Gyn Onc. Was finally able  to get in touch with Dr. Hervey Ard who was involved in her care when she was seen in May 2024. She reports that she is familiar with the patient. She will discuss with transfer center to see if she can be transferred over for care.   [facility], with Dr. Lezlie Lye as accepting physician, transfer for continuation of care. Family and patient are agreeable to transfer over to [facility].  I discussed with Dr. Lezlie Lye about our general surgeons seeing the patient with possible NG tube placement.  Patient currently does not have any obstructive symptoms such as nausea, or vomiting.  Denies any belly pain only when coughing.  She does not see any need for any NG tube at this time.  I think this is reasonable.  EMTALA completed by my attending.   Portions of this report may have been transcribed using voice recognition software. Every effort was made to ensure accuracy; however, inadvertent computerized transcription errors may be present.   I discussed this case with my attending physician who cosigned this note including patient's presenting symptoms, physical exam, and planned diagnostics and interventions. Attending physician stated agreement with plan or made changes to plan which were implemented.   Final Clinical Impression(s) / ED Diagnoses Final diagnoses:  Small bowel obstruction (HCC)    Rx / DC Orders ED Discharge Orders     None

## 2023-06-11 NOTE — ED Provider Notes (Signed)
[place] EMERGENCY DEPARTMENT AT [facility] Provider Note   CSN: [id number] Arrival date & time: 06/11/23  1048     History Chief Complaint  Patient presents with   Cough    Cindy Dudley is a 72 y.o. female with h/o PE on Eliquis, acute GI bleed, HFpEF, tremor, meningioma of the brain, HLD presents to the ER for evaluation of multiple concerns. The patient reports that she has had a occasionally productive cough for the past 5 days and was diagnosed with bronchitis yesterday at urgent care and was placed on prednisone, antibiotic, as well as an inhaler but still continues to "wheeze".  She reports that she has been feeling shortness of breath since she had her abdominal surgery in May.  Reports that the shortness of breath is not exertional nor does it happen randomly, just happens whenever she is talking on the phone.  Daughter-in-law was concerned because as they were on the phone today, patient took a deep breath and almost like she could not catch her breath however was then normal after that.  The patient has an ostomy bag from colitis since the 1980s.  She reports that she has been having green output which was concerning for her as she is usually had brown, normal-appearing stools. She did have   Green output present today.  She denies any nausea or vomiting.  She reports that 3 days into her coughing she started to have pain in her right upper abdomen.  Reports some relief with heating pad.  Additionally, she mentions that she has been having lower abdominal swelling since her surgery in May.  She is followed up with her [facility] team multiple times about this and reports this is typical postsurgery.  Additionally, she has been worked up for her shortness of breath after surgery as well.  She was told that she has a "heart problem" but is not able to see a cardiologist for the next few months.  She currently denies any chest pain or any palpitations.  Denies any dysuria or any  hematuria.  She denies any fevers or chills.  She reports that her abdominal pain only hurts never she is coughing otherwise feels okay.  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  When she was discharged home close she was placed on Lovenox and then was put on Eliquis 2.5 twice daily instead of her daily 5.  Husband at bedside reports that they are trying to discontinue this given that she only had a blood clot once and was unprovoked.  They do have a appointment with the heme-onc provider however this on till December.   Cough Associated symptoms: shortness of breath   Associated symptoms: no chest pain, no chills, no fever and no rhinorrhea        Home Medications Prior to Admission medications   Medication Sig Start Date End Date Taking? Authorizing Provider  Ascorbic Acid (VITAMIN C) 1000 MG tablet Take 1,000 mg by mouth daily.    [provider]  CALCIUM CITRATE PO Take 1 tablet by mouth daily.    [provider]  Cholecalciferol 50 MCG (2000 UT) TABS Take 2,000 Units by mouth daily.    [provider]  ELIQUIS 5 MG TABS tablet Take 5 mg by mouth 2 (two) times daily. 07/13/22   [provider]  lactase (LACTAID) 3000 units tablet Take 500  Units by mouth 3 (three) times daily with meals.    [provider]  latanoprost (XALATAN) 0.005 % ophthalmic solution  03/13/18   [provider]  MAGNESIUM CITRATE PO Take 150 mg by mouth 3 (three) times daily.    [provider]  Misc Natural Products (PROGESTERONE EX) Apply 1 Application topically.    [provider]  UNABLE TO FIND Take 1 capsule by mouth daily. Med Name: RA Probitoic Digestive Care    [provider]  UNABLE TO FIND Med Name: algae cal plus boost    [provider]      Allergies    Latex and Sulfa antibiotics    Review of Systems    Review of Systems  Constitutional:  Negative for chills and fever.  HENT:  Negative for congestion and rhinorrhea.   Respiratory:  Positive for cough and shortness of breath.   Cardiovascular:  Negative for chest pain and palpitations.  Gastrointestinal:  Positive for abdominal pain. Negative for nausea and vomiting.       Loose, green ostomy output.   Genitourinary:  Negative for dysuria and hematuria.    Physical Exam Updated Vital Signs BP (!) 141/75 (BP Location: Right Arm)   Pulse (!) 128   Temp 100 F (37.8 C) (Oral)   Resp 18   Ht 4\' 10"  (1.473 m)   Wt 71 kg   SpO2 94%   BMI 32.71 kg/m  Physical Exam Vitals and nursing note reviewed.  Constitutional:      General: She is not in acute distress.    Appearance: She is not ill-appearing or toxic-appearing.  HENT:     Mouth/Throat:     Mouth: Mucous membranes are moist.  Cardiovascular:     Rate and Rhythm: Tachycardia present.  Pulmonary:     Effort: Pulmonary effort is normal. No respiratory distress.     Comments: Lung sounds slightly diminished at the bases however do not appreciate any wheezing.  No stridor.  She is speaking in full sentences, satting well room air without increased work of breathing. Abdominal:     Palpations: Abdomen is soft.     Tenderness: There is abdominal tenderness. There is no guarding or rebound.     Comments: The patient does have a long vertical midline abdominal incision that does appear to be healing well.  Ostomy present in the right sided abdomen.  Has more tenderness to the left side of her abdomen however only has pain in the right upper quadrant only when coughing.  Right upper quadrant is nontender to palpation.  From her ostomy output, there is green stool however there are spinach leaves present in the ostomy bag.  I do not appreciate any melena or any hematochezia.  At the more inferior aspect of the surgical incision, overlying the patient's mons pubis there is significant  induration as well as overlying warmth and erythema.  Do not appreciate any fluctuance.  Patient reports that she has had some numbness there since surgery.  Musculoskeletal:     Right lower leg: No edema.     Left lower leg: No edema.  Skin:    General: Skin is warm and dry.  Neurological:     General: No focal deficit present.     Mental Status: She is alert.     Cranial Nerves: No cranial nerve deficit.     Motor: Tremor present.     Gait: Gait normal.     ED Results / Procedures /  Treatments   Labs (all labs ordered are listed, but only abnormal results are displayed) Labs Reviewed  COMPREHENSIVE METABOLIC PANEL - Abnormal; Notable for the following components:      Result Value   Glucose, Bld 133 (*)    BUN 6 (*)    Albumin 3.2 (*)    All other components within normal limits  CBC - Abnormal; Notable for the following components:   WBC 11.4 (*)    All other components within normal limits  URINALYSIS, ROUTINE W REFLEX MICROSCOPIC - Abnormal; Notable for the following components:   APPearance HAZY (*)    All other components within normal limits  CULTURE, BLOOD (ROUTINE X 2)  CULTURE, BLOOD (ROUTINE X 2)  LIPASE, BLOOD  BRAIN NATRIURETIC PEPTIDE  LACTIC ACID, PLASMA  TSH  LACTIC ACID, PLASMA  TROPONIN I (HIGH SENSITIVITY)  TROPONIN I (HIGH SENSITIVITY)    EKG EKG Interpretation Date/Time:  Monday June 11 2023 11:24:04 EDT Ventricular Rate:  110 PR Interval:  124 QRS Duration:  76 QT Interval:  318 QTC Calculation: 430 R Axis:   78  Text Interpretation: Sinus tachycardia Otherwise normal ECG No previous ECGs available No acute changes No old tracing to compare Confirmed by Derwood Kaplan [phone]) on 06/11/2023 1:46:26 PM  Radiology CT ABDOMEN PELVIS W CONTRAST  Result Date: 06/11/2023 CLINICAL DATA:  Abdominal pain EXAM: CT ABDOMEN AND PELVIS WITH CONTRAST TECHNIQUE: Multidetector CT imaging of the abdomen and pelvis was performed using the standard protocol  following bolus administration of intravenous contrast. RADIATION DOSE REDUCTION: This exam was performed according to the departmental dose-optimization program which includes automated exposure control, adjustment of the mA and/or kV according to patient size and/or use of iterative reconstruction technique. CONTRAST:  75mL OMNIPAQUE IOHEXOL 350 MG/ML SOLN COMPARISON:  03/22/2023 FINDINGS: Lower chest: Small linear patchy densities are seen in lower lung fields, more so on the right side. Hepatobiliary: Surgical clips are seen in gallbladder fossa. A 7 mm low-density in the inferior right lobe with no interval change. There is no dilation of bile ducts. Pancreas: No focal abnormalities are seen. Spleen: Unremarkable. Adrenals/Urinary Tract: Adrenals are unremarkable. There is no hydronephrosis. Parapelvic cysts are seen in both kidneys. There is 6 mm cortical cyst in the lower pole of right kidney. There are no renal or ureteral stones. Urinary bladder is not distended. Stomach/Bowel: Stomach is not distended. Small bowel loops are not dilated. Ileostomy is seen in right lower abdomen. There is previous colectomy. Vascular/Lymphatic: Scattered arterial calcifications are seen. Reproductive: Uterus is not seen. There is fluid collection measuring 13.1 x 9.1 cm in size filling the pelvic cavity. This may be part of ascites or ovarian mass. No definite demonstrable thick internal septations or nodules are seen.There are multiple surgical clips in the posteroinferior aspect of pelvis without significant change. Other: There is interval appearance of small to moderate ascites. There is ventral hernia containing short segment of small bowel loop in anterior abdominal wall close to midline to the left of umbilicus. There is 5.1 x 2.9 cm loculated fluid collection in this ventral hernia. There is no pneumoperitoneum. There is 6.7 x 4.2 cm loculated fluid collection in subcutaneous plane in suprapubic region with thick  wall. There are no pockets of air within this fluid collection. There is stranding in the fat planes and mons pubis. Musculoskeletal: No acute findings are seen. IMPRESSION: There is no evidence of intestinal obstruction or pneumoperitoneum. There is no hydronephrosis. There is interval appearance of small to  moderate ascites. Large amount of fluid is seen in pelvic cavity which may be cystic mass in 1 of the ovaries or loculated ascites. Previous colectomy with ileostomy in right lower abdomen. There is new loculated fluid collection in subcutaneous plane in the left paraumbilical region in anterior abdominal wall which may be loculated ascites or some other inflammatory or neoplastic process. There is 6.7 x 4.2 cm loculated thick-walled fluid collection in subcutaneous plane in suprapubic region with adjacent stranding. Possibility of an abscess is not excluded. Sonogram and aspiration as clinically warranted may be considered. There is a ventral hernia containing short segment of small bowel loop in the left paraumbilical region without signs of incarceration or obstruction. Electronically Signed   By: Ernie Avena M.D.   On: 06/11/2023 17:39   CT Angio Chest PE W and/or Wo Contrast  Result Date: 06/11/2023 CLINICAL DATA:  Cough, difficulty breathing EXAM: CT ANGIOGRAPHY CHEST WITH CONTRAST TECHNIQUE: Multidetector CT imaging of the chest was performed using the standard protocol during bolus administration of intravenous contrast. Multiplanar CT image reconstructions and MIPs were obtained to evaluate the vascular anatomy. RADIATION DOSE REDUCTION: This exam was performed according to the departmental dose-optimization program which includes automated exposure control, adjustment of the mA and/or kV according to patient size and/or use of iterative reconstruction technique. CONTRAST:  75mL OMNIPAQUE IOHEXOL 350 MG/ML SOLN COMPARISON:  05/09/2023 FINDINGS: Cardiovascular: There is homogeneous  enhancement of the thoracic aorta. There are no intraluminal filling defects in pulmonary artery branches. Small coronary artery calcifications are seen. Calcifications are seen in thoracic aorta and its major branches. Mediastinum/Nodes: No significant lymphadenopathy seen. There is ill-defined 1.5 cm low-density nodule in the left lobe of thyroid. Lungs/Pleura: Small patchy infiltrates are seen in posterior lower lung fields, more so on the right side. There is no focal pulmonary consolidation. There is calcified granuloma in right upper lobe. There is no pleural effusion or pneumothorax. Upper Abdomen: Ascites is present in the abdomen. Musculoskeletal: Degenerative changes are noted in thoracic spine. Review of the MIP images confirms the above findings. IMPRESSION: There is no evidence of pulmonary artery embolism. There is no evidence of thoracic aortic dissection. Aortic arteriosclerosis. Small patchy infiltrates are seen in posterior lower lung fields suggesting scarring or subsegmental atelectasis. There is no focal consolidation. There is no pleural effusion or pneumothorax. Electronically Signed   By: Ernie Avena M.D.   On: 06/11/2023 17:17   DG Chest 2 View  Result Date: 06/11/2023 CLINICAL DATA:  Cough for 5 days. EXAM: CHEST - 2 VIEW COMPARISON:  Chest radiograph 05/09/2019 FINDINGS: The cardiomediastinal silhouette is normal There is no focal consolidation or pulmonary edema. There is no pleural effusion or pneumothorax There is no acute osseous abnormality. Upper abdominal surgical clips are noted. IMPRESSION: No radiographic evidence of acute cardiopulmonary process. Electronically Signed   By: Lesia Hausen M.D.   On: 06/11/2023 11:48    Procedures Procedures   Medications Ordered in ED Medications  lactated ringers infusion (has no administration in time range)  piperacillin-tazobactam (ZOSYN) IVPB 3.375 g (0 g Intravenous Stopped 06/12/23 0003)  iohexol (OMNIPAQUE) 350 MG/ML  injection 75 mL (75 mLs Intravenous Contrast Given 06/11/23 1658)  fentaNYL (SUBLIMAZE) injection 50 mcg (50 mcg Intravenous Given 06/11/23 2342)    ED Course/ Medical Decision Making/ A&P Clinical Course as of 06/12/23 0026  Mon Jun 11, 2023  1553 Called CT. They report that she is next in line. [RR]  2018 [facility] line has been consulted twice  by Diplomatic Services operational officer without call back. Will page again.  [RR]  2137 Lezlie Lye,  [RR]  2150 On the phone with the transfer center.  [RR]    Clinical Course User Index [RR] Achille Rich, PA-C                            Medical Decision Making Amount and/or Complexity of Data Reviewed Labs: ordered. Radiology: ordered.  Risk Prescription drug management.   72 y.o. female presents to the ER for evaluation of SOB/cough, abdominal pain. Differential diagnosis includes but is not limited to AAA, mesenteric ischemia, appendicitis, diverticulitis, DKA, gastroenteritis, nephrolithiasis, pancreatitis, constipation, UTI, bowel obstruction, biliary disease, IBD, PUD, hepatitis. Vital signs show tachycardia and mildly elevated BP, otherwise normal. Physical exam as noted above.   Given the patient's recently decreased Eliquis dose in her unprovoked PE as well as her tachycardia, and concern for pulmonary embolism.  Will order a CT angio of the chest.  I independently reviewed and interpreted the patient's labs.  CBC shows white blood count 11.4 otherwise no anemia.  BMP within normal limits.  Lactic acid within normal limits.  Lipase within normal limits.  Urinalysis shows hazy urine otherwise unremarkable.  CMP does show slightly elevated glucose at 133, BUN is 6, albumin 3.2, otherwise no electrolyte or LFT abnormality.  Troponin at 7, TSH within normal limits as well.  Blood cultures are pending.  CT Angio PE shows there is no evidence of pulmonary artery embolism. There is no evidence of thoracic aortic dissection. Aortic arteriosclerosis. Small patchy infiltrates are  seen in posterior lower lung fields suggesting scarring or subsegmental atelectasis. There is no focal consolidation. There is no pleural effusion or pneumothorax.   CT Abd/Pelvis shows there is no evidence of intestinal obstruction or pneumoperitoneum. There is no hydronephrosis. There is interval appearance of small to moderate ascites. Large amount of fluid is seen in pelvic cavity which may be cystic mass in 1 of the ovaries or loculated ascites. Previous colectomy with ileostomy in right lower abdomen. There is new loculated fluid collection in subcutaneous plane in the left paraumbilical region in anterior abdominal wall which may be loculated ascites or some other inflammatory or neoplastic process. There is 6.7 x 4.2 cm loculated thick-walled fluid collection in subcutaneous plane in suprapubic region with adjacent stranding. Possibility of an abscess is not excluded. Sonogram and aspiration as clinically warranted may be considered. There is a ventral hernia containing short segment of small bowel loop in the left paraumbilical region without signs of incarceration or obstruction.  [employer] multiple times to [facility] line trying to get a hold of Gyn Onc. I consulted our General Surgery team. Dr. Bedelia Person assessed at bedside.  Please see her note. No complete bowel obstruction, nor was there one seen on CT imaging. Recommended PO SBO protocol. Recommends IR consult for possible drainage.   Was finally able to get in touch with Dr. Hervey Ard who was involved in her care when she was seen in May 2024. She reports that she is familiar with the patient. She will discuss with transfer center to see if she can be transferred over for care.   Again, patient has a white blood cell count less than 12, she is not hypotensive or requiring any oxygen.  She has tachycardia.  I ordered a TSH given the thyroid nodule seen on her imaging however it is in the normal limits.  She does have a slightly worsening  temperature, but this could just be evolving sepsis.  Will go ahead and treat with Zosyn.  Lactated Ringer infusion given her recent diagnosis of HFpEF, patient does not appear to be any volume overload however she is not hypotensive.  Will send her with lactated Ringer infusion.  I am not sure it is causing her shortness of breath however it is not exertional, and only happens in ever she is on the telephone speaking.  The CT scan does not show any signs of a PE or any other intrapulmonary process.  Possibly needs pulmonology follow-up.  Again, she speaking in full sentences satting well on room air without any increased work of breathing on my evaluation.  [facility] reached out, with Dr. Lezlie Lye as accepting physician, transfer for continuation of care. Family and patient are agreeable to transfer over to [facility].  I discussed with Dr. Lezlie Lye about our general surgeons seeing the patient with possible NG tube placement. Discussed with her the other CT findings as well as the sinus tachycardia from unknown source. Patient currently does not have any obstructive symptoms such as nausea, or vomiting.  Denies any belly pain only when coughing.  She does not see any need for any NG tube at this time.  I think this is reasonable. Will start Zosyn for intra abdominal infection. Patient does not meet SIRS criteria. Her BP is within normal limits. Will order LR infusion. EMTALA completed by my attending.   Portions of this report may have been transcribed using voice recognition software. Every effort was made to ensure accuracy; however, inadvertent computerized transcription errors may be present.   I discussed this case with my attending physician who cosigned this note including patient's presenting symptoms, physical exam, and planned diagnostics and interventions. Attending physician stated agreement with plan or made changes to plan which were implemented.   Final Clinical Impression(s) / ED Diagnoses Final  diagnoses:  Intraabdominal fluid collection  Ventral hernia without obstruction or gangrene  Sinus tachycardia  SOB (shortness of breath)  Subacute cough    Rx / DC Orders ED Discharge Orders     None         Achille Rich, PA-C 06/12/23 1610    Pricilla Loveless, MD 06/13/23 [phone]

## 2023-06-11 NOTE — Progress Notes (Deleted)
ED Pharmacy Antibiotic Sign Off An antibiotic consult was received from an ED provider for Cefepime per pharmacy dosing for intraabdominal abscess. A chart review was completed to assess appropriateness.   The following one time order(s) were placed:  Cefepime 2 g IV   Further antibiotic and/or antibiotic pharmacy consults should be ordered by the admitting provider if indicated.   Thank you for allowing pharmacy to be a part of this patient's care.   Eddie Candle, [facility]  Clinical Pharmacist 06/11/23 11:22 PM

## 2023-06-11 NOTE — ED Notes (Signed)
Gone to CT

## 2023-06-11 NOTE — ED Notes (Signed)
Pt transported to xray 

## 2023-06-11 NOTE — Consult Note (Signed)
Reason for Consult/Chief Complaint: mons fluid collection, ?abscess Consultant: Alison Murray, [place]  Cindy Dudley is an 72 y.o. female.   HPI: 42F with 6 days of cough with associated pain just above the ostomy. Denies nausea/vomiting, but states that her ostomy output has been lower than usual. Went to [facility] 7/14 for cough and was given inhaler, abx, and prednisone.   Consult received for fluid collection of mons pubis. Patient does note some firmness in this area when questioned, but her primary complaint is abdominal pain. Notably, patient had an exlap, resection of pelvic mass with LSO, salpingectomy, omentectomy, B pelvic and para-aortic lymph node dissection on 04/12/23 for a pelvic mass. Post-operative course was complicated by acute UGIB requiring ICU admission, blood transfusion and pressor support, ultimately discharged home on 04/21/23. 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  Past Medical History:  Diagnosis Date   Anemia    Asthma    Blood transfusion    Brain tumor (HCC) 1995   Bruises easily    Bursitis    left knee   Colitis, ulcerative (HCC) 1969   Fatigue    Fibroid tumor 1982   Hearing loss    Kidney stone    Ovarian cyst 1981   Rash    Sore throat    Spasmodic dysphonia 1982   Spasmodic torticollis 2007   Ulcer    Wears glasses     Past Surgical History:  Procedure Laterality Date   ABDOMINAL HYSTERECTOMY  1982   BRAIN TUMOR EXCISION  1995   CHOLECYSTECTOMY  2016   ILEOSTOMY  1982   LARYNX SURGERY  2000   OVARIAN CYST REMOVAL      Family History  Problem Relation Age of Onset   Cancer Mother        breast   Food Allergy Mother    Allergic rhinitis Mother    Dementia Mother    Hypertension Mother    Glaucoma Mother    Heart disease Father    Gout Father    Fibromyalgia Sister    Hyperlipidemia Brother    Throat cancer Brother    Cancer Paternal Aunt        colon   Diabetes Paternal Grandmother     Social History:  reports that she has never  smoked. She has never used smokeless tobacco. She reports that she does not drink alcohol and does not use drugs.  Allergies:  Allergies  Allergen Reactions   Latex Itching    Per patient   Sulfa Antibiotics     OTHER REACTION: BLOATING    Medications: I have reviewed the patient's current medications.  Results for orders placed or performed during the hospital encounter of 06/11/23 (from the past 48 hour(s))  Urinalysis, Routine w reflex microscopic -Urine, Clean Catch     Status: Abnormal   Collection Time: 06/11/23 11:07 AM  Result Value Ref Range   Color, Urine YELLOW YELLOW   APPearance HAZY (A) CLEAR   Specific Gravity, Urine 1.006 1.005 - 1.030   pH 6.0 5.0 - 8.0   Glucose, UA NEGATIVE NEGATIVE mg/dL   Hgb urine dipstick NEGATIVE NEGATIVE   Bilirubin Urine NEGATIVE NEGATIVE   Ketones, ur NEGATIVE NEGATIVE mg/dL   Protein, ur NEGATIVE NEGATIVE mg/dL   Nitrite NEGATIVE NEGATIVE   Leukocytes,Ua NEGATIVE NEGATIVE    Comment: Performed at [facility], [address] [place], [place] [address]  Lipase, blood     Status: None   Collection Time: 06/11/23  11:20 AM  Result Value Ref Range   Lipase 25 11 - 51 U/L    Comment: Performed at [facility], [address], [place], [place] [address]  Comprehensive metabolic panel     Status: Abnormal   Collection Time: 06/11/23 11:20 AM  Result Value Ref Range   Sodium 135 135 - 145 mmol/L   Potassium 4.0 3.5 - 5.1 mmol/L   Chloride 102 98 - 111 mmol/L   CO2 22 22 - 32 mmol/L   Glucose, Bld 133 (H) 70 - 99 mg/dL    Comment: Glucose reference range applies only to samples taken after fasting for at least 8 hours.   BUN 6 (L) 8 - 23 mg/dL   Creatinine, Ser 9.14 0.44 - 1.00 mg/dL   Calcium 9.1 8.9 - 78.2 mg/dL   Total Protein 6.7 6.5 - 8.1 g/dL   Albumin 3.2 (L) 3.5 - 5.0 g/dL   AST 28 15 - 41 U/L   ALT 25 0 - 44 U/L   Alkaline Phosphatase 90 38 - 126 U/L   Total Bilirubin 0.6 0.3 - 1.2 mg/dL   GFR, Estimated >95  >62 mL/min    Comment: (NOTE) Calculated using the CKD-EPI Creatinine Equation (2021)    Anion gap 11 5 - 15    Comment: Performed at [facility], [address], [place], [place] [address]  CBC     Status: Abnormal   Collection Time: 06/11/23 11:20 AM  Result Value Ref Range   WBC 11.4 (H) 4.0 - 10.5 K/uL   RBC 4.15 3.87 - 5.11 MIL/uL   Hemoglobin 12.1 12.0 - 15.0 g/dL   HCT 57.8 46.9 - 62.9 %   MCV 91.3 80.0 - 100.0 fL   MCH 29.2 26.0 - 34.0 pg   MCHC 31.9 30.0 - 36.0 g/dL   RDW 52.8 41.3 - 24.4 %   Platelets 363 150 - 400 K/uL   nRBC 0.0 0.0 - 0.2 %    Comment: Performed at [facility], [address], [place], [place] [address]    CT ABDOMEN PELVIS W CONTRAST  Result Date: 06/11/2023 CLINICAL DATA:  Abdominal pain EXAM: CT ABDOMEN AND PELVIS WITH CONTRAST TECHNIQUE: Multidetector CT imaging of the abdomen and pelvis was performed using the standard protocol following bolus administration of intravenous contrast. RADIATION DOSE REDUCTION: This exam was performed according to the departmental dose-optimization program which includes automated exposure control, adjustment of the mA and/or kV according to patient size and/or use of iterative reconstruction technique. CONTRAST:  75mL OMNIPAQUE IOHEXOL 350 MG/ML SOLN COMPARISON:  03/22/2023 FINDINGS: Lower chest: Small linear patchy densities are seen in lower lung fields, more so on the right side. Hepatobiliary: Surgical clips are seen in gallbladder fossa. A 7 mm low-density in the inferior right lobe with no interval change. There is no dilation of bile ducts. Pancreas: No focal abnormalities are seen. Spleen: Unremarkable. Adrenals/Urinary Tract: Adrenals are unremarkable. There is no hydronephrosis. Parapelvic cysts are seen in both kidneys. There is 6 mm cortical cyst in the lower pole of right kidney. There are no renal or ureteral stones. Urinary bladder is not distended. Stomach/Bowel: Stomach is not distended. Small  bowel loops are not dilated. Ileostomy is seen in right lower abdomen. There is previous colectomy. Vascular/Lymphatic: Scattered arterial calcifications are seen. Reproductive: Uterus is not seen. There is fluid collection measuring 13.1 x 9.1 cm in size filling the pelvic cavity. This may be part of ascites or ovarian  mass. No definite demonstrable thick internal septations or nodules are seen.There are multiple surgical clips in the posteroinferior aspect of pelvis without significant change. Other: There is interval appearance of small to moderate ascites. There is ventral hernia containing short segment of small bowel loop in anterior abdominal wall close to midline to the left of umbilicus. There is 5.1 x 2.9 cm loculated fluid collection in this ventral hernia. There is no pneumoperitoneum. There is 6.7 x 4.2 cm loculated fluid collection in subcutaneous plane in suprapubic region with thick wall. There are no pockets of air within this fluid collection. There is stranding in the fat planes and mons pubis. Musculoskeletal: No acute findings are seen. IMPRESSION: There is no evidence of intestinal obstruction or pneumoperitoneum. There is no hydronephrosis. There is interval appearance of small to moderate ascites. Large amount of fluid is seen in pelvic cavity which may be cystic mass in 1 of the ovaries or loculated ascites. Previous colectomy with ileostomy in right lower abdomen. There is new loculated fluid collection in subcutaneous plane in the left paraumbilical region in anterior abdominal wall which may be loculated ascites or some other inflammatory or neoplastic process. There is 6.7 x 4.2 cm loculated thick-walled fluid collection in subcutaneous plane in suprapubic region with adjacent stranding. Possibility of an abscess is not excluded. Sonogram and aspiration as clinically warranted may be considered. There is a ventral hernia containing short segment of small bowel loop in the left  paraumbilical region without signs of incarceration or obstruction. Electronically Signed   By: Ernie Avena M.D.   On: 06/11/2023 17:39   CT Angio Chest PE W and/or Wo Contrast  Result Date: 06/11/2023 CLINICAL DATA:  Cough, difficulty breathing EXAM: CT ANGIOGRAPHY CHEST WITH CONTRAST TECHNIQUE: Multidetector CT imaging of the chest was performed using the standard protocol during bolus administration of intravenous contrast. Multiplanar CT image reconstructions and MIPs were obtained to evaluate the vascular anatomy. RADIATION DOSE REDUCTION: This exam was performed according to the departmental dose-optimization program which includes automated exposure control, adjustment of the mA and/or kV according to patient size and/or use of iterative reconstruction technique. CONTRAST:  75mL OMNIPAQUE IOHEXOL 350 MG/ML SOLN COMPARISON:  05/09/2023 FINDINGS: Cardiovascular: There is homogeneous enhancement of the thoracic aorta. There are no intraluminal filling defects in pulmonary artery branches. Small coronary artery calcifications are seen. Calcifications are seen in thoracic aorta and its major branches. Mediastinum/Nodes: No significant lymphadenopathy seen. There is ill-defined 1.5 cm low-density nodule in the left lobe of thyroid. Lungs/Pleura: Small patchy infiltrates are seen in posterior lower lung fields, more so on the right side. There is no focal pulmonary consolidation. There is calcified granuloma in right upper lobe. There is no pleural effusion or pneumothorax. Upper Abdomen: Ascites is present in the abdomen. Musculoskeletal: Degenerative changes are noted in thoracic spine. Review of the MIP images confirms the above findings. IMPRESSION: There is no evidence of pulmonary artery embolism. There is no evidence of thoracic aortic dissection. Aortic arteriosclerosis. Small patchy infiltrates are seen in posterior lower lung fields suggesting scarring or subsegmental atelectasis. There is no  focal consolidation. There is no pleural effusion or pneumothorax. Electronically Signed   By: Ernie Avena M.D.   On: 06/11/2023 17:17   DG Chest 2 View  Result Date: 06/11/2023 CLINICAL DATA:  Cough for 5 days. EXAM: CHEST - 2 VIEW COMPARISON:  Chest radiograph 05/09/2019 FINDINGS: The cardiomediastinal silhouette is normal There is no focal consolidation or pulmonary edema. There is no pleural  effusion or pneumothorax There is no acute osseous abnormality. Upper abdominal surgical clips are noted. IMPRESSION: No radiographic evidence of acute cardiopulmonary process. Electronically Signed   By: Lesia Hausen M.D.   On: 06/11/2023 11:48    ROS 10 point review of systems is negative except as listed above in HPI.   Physical Exam Blood pressure 131/79, pulse (!) 108, temperature 99.5 F (37.5 C), temperature source Oral, resp. rate 19, SpO2 96%. Constitutional: well-developed, well-nourished HEENT: pupils equal, round, reactive to light, 2mm b/l, moist conjunctiva, external inspection of ears and nose normal, hearing intact Oropharynx: normal oropharyngeal mucosa Neck: no thyromegaly, trachea midline, no midline cervical tenderness to palpation Chest: breath sounds equal bilaterally, normal respiratory effort, no midline or lateral chest wall tenderness to palpation/deformity Abdomen: soft, healing midline incision with palpable hernia superolateral to the umbilicus-at least partially reducible, cannot confirm complete reduction, ostomy productive, no bruising, no hepatosplenomegaly GU: normal female genitalia, erythema and induration of mons without fluctuance Back: no wounds, no thoracic/lumbar spine tenderness to palpation, no thoracic/lumbar spine stepoffs Rectal: deferred Skin: warm, dry, no rashes Psych: normal memory, normal mood/affect     Assessment/Plan: 64F s/p recent exlap for pelvic mass, now with fluid collection of mons pubis, also with abdominal pain and decreased  ostomy output.  - recommend IR consult for drain placement of mons fluid collection and possible drainage of intra-abdominal fluid collection for cytology given recent surgery for malignancy. Will be available in case of need for operative drainage of mons fluid collection - do not suspect complete bowel obstruction, recommend PO SBO protocol to ensure passage through to colon - defer w/u of cough to primary team, consider respiratory panel - okay for clear liquids and initiation DVT ppx from my standpoint.   Will continue to follow.    Diamantina Monks, MD General and Trauma Surgery [facility]

## 2023-06-12 DIAGNOSIS — R0602 Shortness of breath: Secondary | ICD-10-CM | POA: Diagnosis not present

## 2023-06-12 DIAGNOSIS — Z86711 Personal history of pulmonary embolism: Secondary | ICD-10-CM | POA: Diagnosis not present

## 2023-06-12 DIAGNOSIS — I509 Heart failure, unspecified: Secondary | ICD-10-CM | POA: Diagnosis not present

## 2023-06-12 DIAGNOSIS — R052 Subacute cough: Secondary | ICD-10-CM | POA: Diagnosis not present

## 2023-06-12 DIAGNOSIS — Z91040 Latex allergy status: Secondary | ICD-10-CM | POA: Diagnosis not present

## 2023-06-12 DIAGNOSIS — R251 Tremor, unspecified: Secondary | ICD-10-CM | POA: Diagnosis not present

## 2023-06-12 DIAGNOSIS — R109 Unspecified abdominal pain: Secondary | ICD-10-CM | POA: Diagnosis present

## 2023-06-12 DIAGNOSIS — Z85841 Personal history of malignant neoplasm of brain: Secondary | ICD-10-CM | POA: Diagnosis not present

## 2023-06-12 DIAGNOSIS — K439 Ventral hernia without obstruction or gangrene: Secondary | ICD-10-CM | POA: Diagnosis not present

## 2023-06-12 DIAGNOSIS — Z7901 Long term (current) use of anticoagulants: Secondary | ICD-10-CM | POA: Diagnosis not present

## 2023-06-12 DIAGNOSIS — R000 Tachycardia, unspecified: Secondary | ICD-10-CM | POA: Diagnosis not present

## 2023-06-12 LAB — TSH: TSH: 2.784 u[IU]/mL (ref 0.350–4.500)

## 2023-06-15 LAB — CULTURE, BLOOD (ROUTINE X 2)

## 2023-06-16 LAB — CULTURE, BLOOD (ROUTINE X 2): Culture: NO GROWTH

## 2023-08-07 NOTE — Patient Instructions (Signed)

## 2023-08-07 NOTE — Progress Notes (Unsigned)
No chief complaint on file.   HISTORY OF PRESENT ILLNESS:  08/07/23 ALL:  Cindy Dudley is a 72 y.o. female here today for follow up for history of meningiomas. She was last seen by Dr Terrace Arabia 07/2022. MRI showed "Stable appearance of 3 enhancing extra-axial dural based masses consistent with meningiomas. These are in the anterior right parafalcine region (18 x 14 mm), adjacent to the left anterior clinoid process (8 mm) and anterior to the left temporal lobe (8 mm) compared to the MRI from 11/03/2020, they are stable in size and appearance."   Since,   tremor   HISTORY (copied from Dr Zannie Cove previous note)  Cindy Dudley, is a 72 year old female seen in request by primary care physician Dr. Raynelle Jan.,  to  follow-up for meningioma, initial evaluation was on August 07, 2022   I reviewed and summarized the referring note. PMHX. 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, she denies any difficulties   She was having imaging study every few years, usually done at [facility] imaging center, last record I can find in epic system was CT head without contrast March 22, 2019 at [facility] system, describes stable 12 mm mass along the right anterior parasagittal frontal lobe, consistent with meningioma, radiology has compared the film to previous imaging MRI December 2019, CT scan May 2005,    She also has a long history of head titubation, started around the year she had a craniotomy, she has intermittent bilateral hands tremor, previously had a history of spastic dysphonia, had laryngeal surgery in [place] in 2000, responded very well,    Paternal uncle has tremor, carried a diagnosis of  Parkinson's disease,.  Her son has intermittent bilateral hands tremor   History of pulmonary emboli on anticoagulation treatment   REVIEW OF SYSTEMS: Out of a complete 14 system review of symptoms, the patient complains only of the following symptoms, and all other reviewed systems are negative.   ALLERGIES: Allergies  Allergen Reactions   Latex Itching    Per patient   Sulfa Antibiotics     OTHER REACTION: BLOATING     HOME MEDICATIONS: Outpatient Medications Prior to Visit  Medication Sig Dispense Refill   Ascorbic Acid (VITAMIN C) 1000 MG tablet Take 1,000 mg by mouth daily.     CALCIUM CITRATE PO Take 1 tablet by mouth daily.     Cholecalciferol 50 MCG (2000 UT) TABS Take 2,000 Units by mouth daily.     ELIQUIS 5 MG TABS tablet Take 5 mg by mouth 2 (two) times daily.     lactase (LACTAID) 3000 units tablet Take 500 Units by mouth 3 (three) times daily with meals.     latanoprost (XALATAN) 0.005 % ophthalmic solution      MAGNESIUM CITRATE PO Take 150 mg by mouth 3 (three) times daily.     Misc Natural Products (PROGESTERONE EX) Apply 1 Application topically.     UNABLE TO FIND Take 1 capsule by mouth daily. Med Name: RA Probitoic Digestive Care     UNABLE TO FIND Med Name: algae cal plus boost     No facility-administered medications prior to visit.     PAST MEDICAL HISTORY:  Past Medical History:  Diagnosis Date   Anemia    Asthma    Blood transfusion    Brain tumor (HCC) 1995   Bruises easily    Bursitis    left knee   Colitis, ulcerative (HCC) 1969   Fatigue    Fibroid tumor 1982   Hearing loss    Kidney stone    Ovarian cyst 1981   Rash    Sore throat    Spasmodic dysphonia 1982   Spasmodic torticollis 2007   Ulcer    Wears glasses      PAST SURGICAL HISTORY: Past Surgical History:  Procedure Laterality Date   ABDOMINAL HYSTERECTOMY  1982   BRAIN TUMOR EXCISION  1995   CHOLECYSTECTOMY  2016   ILEOSTOMY  1982   LARYNX SURGERY  2000   OVARIAN  CYST REMOVAL       FAMILY HISTORY: Family History  Problem Relation Age of Onset   Cancer Mother        breast   Food Allergy Mother    Allergic rhinitis Mother    Dementia Mother    Hypertension Mother    Glaucoma Mother    Heart disease Father    Gout Father    Fibromyalgia Sister    Hyperlipidemia Brother    Throat cancer Brother    Cancer Paternal Aunt        colon   Diabetes Paternal Grandmother      SOCIAL HISTORY: Social History   Socioeconomic History   Marital status: Married    Spouse name: Not on file   Number of children: Not on file   Years of education: Not on file   Highest education level: Not on file  Occupational History   Not on file  Tobacco Use   Smoking status: Never   Smokeless tobacco: Never  Substance and Sexual Activity   Alcohol use: No   Drug use: No   Sexual activity: Not on file  Other Topics Concern   Not on file  Social History Narrative   Lives home with husband.  Works for [employer].  Education college (6 months) for [employer].   Children 3.     Social Determinants of Health   Financial Resource Strain: Not on file  Food Insecurity: Low Risk  (07/06/2023)   Received from [facility]   Hunger Vital Sign    Worried About Running Out of Food in the Last Year: Never true    Ran Out of Food in the Last Year: Never true  Transportation Needs: Not on file (07/06/2023)  Physical Activity: Not on file  Stress: Not on file  Social Connections: Not on file  Intimate Partner Violence: Not on file     PHYSICAL EXAM  There were no vitals filed for this visit. There is no height or weight on file to calculate BMI.  Generalized: Well developed, in no acute distress  Cardiology: normal rate and rhythm, no murmur auscultated  Respiratory: clear to auscultation bilaterally    Neurological examination  Mentation: Alert oriented to time, place, history taking. Follows all commands speech and language fluent Cranial nerve II-XII:  Pupils were equal round reactive to light. Extraocular movements were full, visual field were full on confrontational test. Facial sensation and strength were normal. Uvula tongue midline. Head turning and shoulder shrug  were normal and symmetric. Motor: The motor testing reveals 5 over 5 strength of all 4 extremities. Good symmetric motor tone is noted throughout.  Sensory: Sensory  testing is intact to soft touch on all 4 extremities. No evidence of extinction is noted.  Coordination: Cerebellar testing reveals good finger-nose-finger and heel-to-shin bilaterally.  Gait and station: Gait is normal. Tandem gait is normal. Romberg is negative. No drift is seen.  Reflexes: Deep tendon reflexes are symmetric and normal bilaterally.    DIAGNOSTIC DATA (LABS, IMAGING, TESTING) - I reviewed patient records, labs, notes, testing and imaging myself where available.  Lab Results  Component Value Date   WBC 11.4 (H) 06/11/2023   HGB 12.1 06/11/2023   HCT 37.9 06/11/2023   MCV 91.3 06/11/2023   PLT 363 06/11/2023      Component Value Date/Time   NA 135 06/11/2023 1120   NA 140 08/07/2022 1626   K 4.0 06/11/2023 1120   CL 102 06/11/2023 1120   CO2 22 06/11/2023 1120   GLUCOSE 133 (H) 06/11/2023 1120   BUN 6 (L) 06/11/2023 1120   BUN 8 08/07/2022 1626   CREATININE 0.76 06/11/2023 1120   CALCIUM 9.1 06/11/2023 1120   PROT 6.7 06/11/2023 1120   PROT 6.3 08/07/2022 1626   ALBUMIN 3.2 (L) 06/11/2023 1120   ALBUMIN 4.1 08/07/2022 1626   AST 28 06/11/2023 1120   ALT 25 06/11/2023 1120   ALKPHOS 90 06/11/2023 1120   BILITOT 0.6 06/11/2023 1120   BILITOT 0.5 08/07/2022 1626   GFRNONAA >60 06/11/2023 1120   No results found for: "CHOL", "HDL", "LDLCALC", "LDLDIRECT", "TRIG", "CHOLHDL" No results found for: "HGBA1C" No results found for: "VITAMINB12" Lab Results  Component Value Date   TSH 2.784 06/11/2023        No data to display               No data to display            ASSESSMENT AND PLAN  72 y.o. year old female  has a past medical history of Anemia, Asthma, Blood transfusion, Brain tumor (HCC) (1995), Bruises easily, Bursitis, Colitis, ulcerative (HCC) (1969), Fatigue, Fibroid tumor (1982), Hearing loss, Kidney stone, Ovarian cyst (1981), Rash, Sore throat, Spasmodic dysphonia (1982), Spasmodic torticollis (2007), Ulcer, and Wears glasses. here with    No diagnosis found.  Virgel Bouquet ***.  Healthy lifestyle habits encouraged. *** will follow up with PCP as directed. *** will return to see me in ***, sooner if needed. *** verbalizes understanding and agreement with this plan.   No orders of the defined types were placed in this encounter.    No orders of the defined types were placed in this encounter.    Shawnie Dapper, MSN, FNP-C 08/07/2023, 4:48 PM  [facility] [address], [address] [place], [place] [address] [phone]

## 2023-08-08 ENCOUNTER — Encounter: Payer: Self-pay | Admitting: Family Medicine

## 2023-08-08 ENCOUNTER — Ambulatory Visit: Payer: Medicare Other | Admitting: Family Medicine

## 2023-08-08 VITALS — BP 131/73 | HR 87 | Ht <= 58 in | Wt 150.0 lb

## 2023-08-08 DIAGNOSIS — D320 Benign neoplasm of cerebral meninges: Secondary | ICD-10-CM | POA: Diagnosis not present

## 2023-08-08 DIAGNOSIS — R260 Ataxic gait: Secondary | ICD-10-CM

## 2023-08-20 ENCOUNTER — Ambulatory Visit (HOSPITAL_COMMUNITY)
Admission: RE | Admit: 2023-08-20 | Discharge: 2023-08-20 | Disposition: A | Discharge status: Home / Self Care | Payer: Medicare Other | Source: Ambulatory Visit | Attending: Plastic Surgery | Admitting: Plastic Surgery

## 2023-08-20 DIAGNOSIS — K9419 Other complications of enterostomy: Secondary | ICD-10-CM | POA: Diagnosis not present

## 2023-08-20 DIAGNOSIS — L24B3 Irritant contact dermatitis related to fecal or urinary stoma or fistula: Secondary | ICD-10-CM | POA: Diagnosis not present

## 2023-08-20 DIAGNOSIS — K5190 Ulcerative colitis, unspecified, without complications: Secondary | ICD-10-CM | POA: Diagnosis not present

## 2023-08-20 DIAGNOSIS — Z932 Ileostomy status: Secondary | ICD-10-CM

## 2023-08-20 NOTE — Discharge Instructions (Signed)
Switched to coloplast 2 piece sensura mio flex, convex  Eakin large barrier seals instead of barrier rings Stoma powder and skin prep Added belt

## 2023-08-20 NOTE — Progress Notes (Signed)
[facility]   Reason for visit:  RLQ ileostomy.  Topography of abdomen changed after recent abdominal surgery and has experienced daily leaks HPI:  Ulcerative colitis with ileostomy Past Medical History:  Diagnosis Date  . Anemia   . Asthma   . Blood transfusion   . Brain tumor (HCC) 1995  . Bruises easily   . Bursitis    left knee  . Colitis, ulcerative (HCC) 1969  . Fatigue   . Fibroid tumor 1982  . Hearing loss   . Kidney stone   . Ovarian cyst 1981  . Rash   . Sore throat   . Spasmodic dysphonia 1982  . Spasmodic torticollis 2007  . Ulcer   . Wears glasses    Family History  Problem Relation Age of Onset  . Cancer Mother        breast  . Food Allergy Mother   . Allergic rhinitis Mother   . Dementia Mother   . Hypertension Mother   . Glaucoma Mother   . Heart disease Father   . Gout Father   . Fibromyalgia Sister   . Hyperlipidemia Brother   . Throat cancer Brother   . Cancer Paternal Aunt        colon  . Diabetes Paternal Grandmother    Allergies  Allergen Reactions  . Latex Itching    Per patient  . Sulfa Antibiotics     OTHER REACTION: BLOATING   Current Outpatient Medications  Medication Sig Dispense Refill Last Dose  . Ascorbic Acid (VITAMIN C) 1000 MG tablet Take 1,000 mg by mouth daily.     Marland Kitchen CALCIUM CITRATE PO Take 1 tablet by mouth daily.     . Cholecalciferol 50 MCG (2000 UT) TABS Take 2,000 Units by mouth daily.     Marland Kitchen ELIQUIS 5 MG TABS tablet Take 5 mg by mouth 2 (two) times daily.     Marland Kitchen latanoprost (XALATAN) 0.005 % ophthalmic solution      . MAGNESIUM CITRATE PO Take 150 mg by mouth daily.     Marland Kitchen UNABLE TO FIND Take 1 capsule by mouth daily. Med Name: RA Probitoic Digestive Care     . UNABLE TO FIND Med Name: algae cal plus boost      No current facility-administered medications for this encounter.   ROS  Review of Systems  Gastrointestinal:        RLQ ileostomy Deep creasing to peristomal skin since abdominal surgery  3 months ago.   Skin:  Positive for color change and rash.  All other systems reviewed and are negative. Vital signs:  BP 132/83   Pulse 79   Temp 98.3 F (36.8 C) (Oral)   Resp 16   SpO2 97%  Exam:  Physical Exam  Stoma type/location:  RLQ retracted ileostomy, os points downward at 6 o'clock Stomal assessment/size:  oval 2.3 cm x 2.7 cm  Peristomal assessment:  denuded skin along bottom half. Os is directing stool this way and effluent is sitting on skin.  Did not hold up with barrier paste recommended at ostomy clinic.   Treatment options for stomal/peristomal skin: Today, due to breakdown, creasing around stoma and likelihood to lose seal, we will stop barrier ring and paste and begin large Eakin seal.  Molded around stoma.  Next, would like a flexible convex system.  Will try Coloplast Mio light convex 2 piece pouch and belt.  Skin prep/stoma powder to peristomal skin Output: soft brown stool Ostomy pouching: 2pc.  Coloplast pouch (flexible light convex)   Eakin seal   Education provided:  performed pouch change and provided 2 additional pouch sets and seals.  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  [employer] no longer accepted by [facility]    Impression/dx  Irritant contact dermatitis Ileostomy complication  Discussion  See above  added eakin seal hoping to improve seal Plan  See back 1 week.     Visit time: 45 minutes.   Maple Hudson FNP-BC

## 2023-08-26 DIAGNOSIS — L24B3 Irritant contact dermatitis related to fecal or urinary stoma or fistula: Secondary | ICD-10-CM | POA: Insufficient documentation

## 2023-08-26 DIAGNOSIS — Z432 Encounter for attention to ileostomy: Secondary | ICD-10-CM | POA: Insufficient documentation

## 2023-08-26 DIAGNOSIS — Z932 Ileostomy status: Secondary | ICD-10-CM | POA: Insufficient documentation

## 2023-08-28 ENCOUNTER — Ambulatory Visit (HOSPITAL_COMMUNITY)
Admission: RE | Admit: 2023-08-28 | Discharge: 2023-08-28 | Disposition: A | Discharge status: Home / Self Care | Payer: Medicare Other | Source: Ambulatory Visit | Attending: *Deleted | Admitting: *Deleted

## 2023-08-28 DIAGNOSIS — Z432 Encounter for attention to ileostomy: Secondary | ICD-10-CM | POA: Diagnosis not present

## 2023-08-28 DIAGNOSIS — K5190 Ulcerative colitis, unspecified, without complications: Secondary | ICD-10-CM | POA: Insufficient documentation

## 2023-08-28 DIAGNOSIS — L24B3 Irritant contact dermatitis related to fecal or urinary stoma or fistula: Secondary | ICD-10-CM | POA: Diagnosis not present

## 2023-08-28 NOTE — Progress Notes (Signed)
[facility]   Reason for visit:  RLQ ileostomy with recent onset leaks since abdominal surgery and deep creasing in midline  HPI:  Ulcerative colitis with ileostomy Past Medical History:  Diagnosis Date  . Anemia   . Asthma   . Blood transfusion   . Brain tumor (HCC) 1995  . Bruises easily   . Bursitis    left knee  . Colitis, ulcerative (HCC) 1969  . Fatigue   . Fibroid tumor 1982  . Hearing loss   . Kidney stone   . Ovarian cyst 1981  . Rash   . Sore throat   . Spasmodic dysphonia 1982  . Spasmodic torticollis 2007  . Ulcer   . Wears glasses    Family History  Problem Relation Age of Onset  . Cancer Mother        breast  . Food Allergy Mother   . Allergic rhinitis Mother   . Dementia Mother   . Hypertension Mother   . Glaucoma Mother   . Heart disease Father   . Gout Father   . Fibromyalgia Sister   . Hyperlipidemia Brother   . Throat cancer Brother   . Cancer Paternal Aunt        colon  . Diabetes Paternal Grandmother    Allergies  Allergen Reactions  . Latex Itching    Per patient  . Sulfa Antibiotics     OTHER REACTION: BLOATING   Current Outpatient Medications  Medication Sig Dispense Refill Last Dose  . Ascorbic Acid (VITAMIN C) 1000 MG tablet Take 1,000 mg by mouth daily.     Marland Kitchen CALCIUM CITRATE PO Take 1 tablet by mouth daily.     . Cholecalciferol 50 MCG (2000 UT) TABS Take 2,000 Units by mouth daily.     Marland Kitchen ELIQUIS 5 MG TABS tablet Take 5 mg by mouth 2 (two) times daily.     Marland Kitchen latanoprost (XALATAN) 0.005 % ophthalmic solution      . MAGNESIUM CITRATE PO Take 150 mg by mouth daily.     Marland Kitchen UNABLE TO FIND Take 1 capsule by mouth daily. Med Name: RA Probitoic Digestive Care     . UNABLE TO FIND Med Name: algae cal plus boost      No current facility-administered medications for this encounter.   ROS  Review of Systems  Constitutional:  Positive for fatigue.       Continues to work.  Needs reliable pouch with predictable wear time.    Gastrointestinal:        RLQ ileostomy, located in creasing.  Skin:  Positive for color change.       Peristomal breakdown  Psychiatric/Behavioral:  The patient is nervous/anxious.        Frustrated with leaking.  Frustrated that surgery is not an option at this time.   All other systems reviewed and are negative. Vital signs:  BP (!) 128/109   Pulse 79   Temp 98.1 F (36.7 C)   Resp 17   SpO2 98%  Exam:  Physical Exam Vitals reviewed.  Constitutional:      Appearance: Normal appearance.  Abdominal:     Palpations: Abdomen is soft.  Skin:    General: Skin is warm and dry.     Findings: Erythema present.  Neurological:     Mental Status: She is alert and oriented to person, place, and time.  Psychiatric:        Mood and Affect: Mood normal.  Behavior: Behavior normal.    Stoma type/location:  RLQ ileostomy Stomal assessment/size:  oval  2 cm x 3.2 cm with creasing at 3 and 9 o'clock.   Peristomal assessment:  erythema and denuded skin Treatment options for stomal/peristomal skin: skin prep and stoma powder.  Barrier ring   Switching to 1 piece deep convex pouch to promote seal  with belt.  Output: liquid brown stool Ostomy pouching: 1pc. Convex with belt.  Education provided:  applied new pouch and provided additional samples to try.     Impression/dx  Ileostomy complication Contact dermatitis Discussion  Try new pouches.  Surgery is not an option at this point.  Emotional support provided that we would keep trying until we found a pouch that will adhere.   Plan  See back next week    Visit time: 50 minutes.   Maple Hudson FNP-BC

## 2023-09-03 NOTE — Discharge Instructions (Signed)
Trying 1 piece deep convex

## 2023-09-04 ENCOUNTER — Ambulatory Visit (HOSPITAL_COMMUNITY): Payer: PRIVATE HEALTH INSURANCE | Admitting: Nurse Practitioner

## 2023-09-10 ENCOUNTER — Ambulatory Visit (HOSPITAL_COMMUNITY)
Admission: RE | Admit: 2023-09-10 | Discharge: 2023-09-10 | Disposition: A | Discharge status: Home / Self Care | Payer: Medicare Other | Source: Ambulatory Visit | Attending: Plastic Surgery | Admitting: Plastic Surgery

## 2023-09-10 DIAGNOSIS — Z432 Encounter for attention to ileostomy: Secondary | ICD-10-CM

## 2023-09-10 DIAGNOSIS — L24B3 Irritant contact dermatitis related to fecal or urinary stoma or fistula: Secondary | ICD-10-CM | POA: Diagnosis not present

## 2023-09-10 DIAGNOSIS — Z932 Ileostomy status: Secondary | ICD-10-CM | POA: Insufficient documentation

## 2023-09-10 NOTE — Progress Notes (Signed)
[facility]   Reason for visit:  RLQ ileostomy, over 40 years ago.  Surgical revision not advised.  Recent surgery this year left abdominal topography irregular and pouch will not stay intact over 12-24 hours. Multiple interventions have been tried at this clinic and [facility] clinic with little success.  HPI:  Ulcerative colitis with ileostomy  was told she had "very little gut left to work with"  Past Medical History:  Diagnosis Date   Anemia    Asthma    Blood transfusion    Brain tumor (HCC) 1995   Bruises easily    Bursitis    left knee   Colitis, ulcerative (HCC) 1969   Fatigue    Fibroid tumor 1982   Hearing loss    Kidney stone    Ovarian cyst 1981   Rash    Sore throat    Spasmodic dysphonia 1982   Spasmodic torticollis 2007   Ulcer    Wears glasses    Family History  Problem Relation Age of Onset   Cancer Mother        breast   Food Allergy Mother    Allergic rhinitis Mother    Dementia Mother    Hypertension Mother    Glaucoma Mother    Heart disease Father    Gout Father    Fibromyalgia Sister    Hyperlipidemia Brother    Throat cancer Brother    Cancer Paternal Aunt        colon   Diabetes Paternal Grandmother    Allergies  Allergen Reactions   Latex Itching    Per patient   Sulfa Antibiotics     OTHER REACTION: BLOATING   Current Outpatient Medications  Medication Sig Dispense Refill Last Dose   Ascorbic Acid (VITAMIN C) 1000 MG tablet Take 1,000 mg by mouth daily.      CALCIUM CITRATE PO Take 1 tablet by mouth daily.      Cholecalciferol 50 MCG (2000 UT) TABS Take 2,000 Units by mouth daily.      ELIQUIS 5 MG TABS tablet Take 5 mg by mouth 2 (two) times daily.      latanoprost (XALATAN) 0.005 % ophthalmic solution       MAGNESIUM CITRATE PO Take 150 mg by mouth daily.      UNABLE TO FIND Take 1 capsule by mouth daily. Med Name: RA Probitoic Digestive Care      UNABLE TO FIND Med Name: algae cal plus boost      No  current facility-administered medications for this encounter.   ROS  Review of Systems  Constitutional:  Positive for fatigue.  Gastrointestinal:        RLQ ileostomy  Skin:  Positive for rash and wound.  Psychiatric/Behavioral:  Positive for dysphoric mood.        IS frustrated that no pouch will stay in place.  She works every day still and this is impacting her quality of life. Emotional support provided  We will keep trying new interventions.   All other systems reviewed and are negative.  Vital signs:  BP 109/74   Pulse 88   Temp (!) 97.5 F (36.4 C)   Resp 17   SpO2 98%  Exam:  Physical Exam Vitals reviewed.  Constitutional:      Appearance: She is obese.  Abdominal:     Palpations: Abdomen is soft.     Comments: Creasing to midline abdomen since recent surgery  Skin:  General: Skin is warm and dry.     Findings: Erythema and rash present.     Comments: Peristomal breakdown  Neurological:     Mental Status: She is alert and oriented to person, place, and time.  Psychiatric:        Mood and Affect: Mood normal.        Behavior: Behavior normal.     Stoma type/location:  RLQ ileostomy Stomal assessment/size:  oval 1 1/2" x 1 " slightly budded, os of mucus fistula opens downward at 6 o'clock.  Stoma is located in a circumferential valley, deepest along bottom half from 3 to 9 o'clock Peristomal assessment:  see above.  Creasing at 9 o'clock from recent abdominal surgery Treatment options for stomal/peristomal skin: Stoma powder and skin prep to peristomal skin.  barrier strip to creasing.  Double layer of barrier ring to bottom half and single layer to top half.   Output: soft green stool Ostomy pouching: 1pc. Flexible convex (Esteem synergy convatec) with  a belt used today/  Appliance is warmed after application to promote seal.  Education provided:  see above    Impression/dx  Contact dermatitis ileostomy Discussion  See above new pouch implemented with  convexity and flexibility with ostomy belt.  Plan  See back one week    Visit time: 45 minutes.   Maple Hudson FNP-BC

## 2023-09-11 NOTE — Discharge Instructions (Signed)
Switched to convatec esteem synergy flexible convex

## 2023-09-18 ENCOUNTER — Ambulatory Visit (HOSPITAL_COMMUNITY): Payer: PRIVATE HEALTH INSURANCE | Admitting: Nurse Practitioner

## 2023-09-25 ENCOUNTER — Ambulatory Visit (HOSPITAL_COMMUNITY)
Admission: RE | Admit: 2023-09-25 | Discharge: 2023-09-25 | Disposition: A | Discharge status: Home / Self Care | Payer: Medicare Other | Source: Ambulatory Visit | Attending: Plastic Surgery | Admitting: Plastic Surgery

## 2023-09-25 DIAGNOSIS — K9419 Other complications of enterostomy: Secondary | ICD-10-CM | POA: Diagnosis present

## 2023-09-25 DIAGNOSIS — K435 Parastomal hernia without obstruction or  gangrene: Secondary | ICD-10-CM | POA: Insufficient documentation

## 2023-09-25 DIAGNOSIS — L24B3 Irritant contact dermatitis related to fecal or urinary stoma or fistula: Secondary | ICD-10-CM

## 2023-09-25 DIAGNOSIS — K9413 Enterostomy malfunction: Secondary | ICD-10-CM | POA: Diagnosis not present

## 2023-09-25 DIAGNOSIS — K5190 Ulcerative colitis, unspecified, without complications: Secondary | ICD-10-CM | POA: Insufficient documentation

## 2023-09-25 NOTE — Progress Notes (Signed)
[facility]   Reason for visit:  RLQ ileostomy with peristomal creasing.  Parastomal hernia and frequent leaks.  Still unable to maintain a seal beyond 12 hours.  HPI:  Ulcerative colitis  Ileostomy over 30 years ago.  Recent abdominal surgery changed the topography of the abdomen contributing to frequent leaks.  Past Medical History:  Diagnosis Date   Anemia    Asthma    Blood transfusion    Brain tumor (HCC) 1995   Bruises easily    Bursitis    left knee   Colitis, ulcerative (HCC) 1969   Fatigue    Fibroid tumor 1982   Hearing loss    Kidney stone    Ovarian cyst 1981   Rash    Sore throat    Spasmodic dysphonia 1982   Spasmodic torticollis 2007   Ulcer    Wears glasses    Family History  Problem Relation Age of Onset   Cancer Mother        breast   Food Allergy Mother    Allergic rhinitis Mother    Dementia Mother    Hypertension Mother    Glaucoma Mother    Heart disease Father    Gout Father    Fibromyalgia Sister    Hyperlipidemia Brother    Throat cancer Brother    Cancer Paternal Aunt        colon   Diabetes Paternal Grandmother    Allergies  Allergen Reactions   Latex Itching    Per patient   Sulfa Antibiotics     OTHER REACTION: BLOATING   Current Outpatient Medications  Medication Sig Dispense Refill Last Dose   Ascorbic Acid (VITAMIN C) 1000 MG tablet Take 1,000 mg by mouth daily.      CALCIUM CITRATE PO Take 1 tablet by mouth daily.      Cholecalciferol 50 MCG (2000 UT) TABS Take 2,000 Units by mouth daily.      ELIQUIS 5 MG TABS tablet Take 5 mg by mouth 2 (two) times daily.      latanoprost (XALATAN) 0.005 % ophthalmic solution       MAGNESIUM CITRATE PO Take 150 mg by mouth daily.      UNABLE TO FIND Take 1 capsule by mouth daily. Med Name: RA Probitoic Digestive Care      UNABLE TO FIND Med Name: algae cal plus boost      No current facility-administered medications for this encounter.   ROS  Review of Systems   Constitutional:  Positive for fatigue.  Gastrointestinal:        RLQ ileostomy, recessed with mucus fistula emptying at 6 o'clock, nearly below skin level  Skin:  Positive for color change and rash.       Peristomal breakdown circumferentially.   Psychiatric/Behavioral:  Positive for dysphoric mood.        Leaking pouch impacting quality of life. Financial concerns over excessive use of supplies.   All other systems reviewed and are negative.  Vital signs:  There were no vitals taken for this visit. Exam:  Physical Exam Cardiovascular:     Rate and Rhythm: Normal rate and regular rhythm.     Pulses: Normal pulses.     Heart sounds: Normal heart sounds.  Pulmonary:     Effort: Pulmonary effort is normal.     Breath sounds: Normal breath sounds.  Abdominal:     Palpations: Abdomen is soft.     Hernia: A hernia is present.  Comments: Rounded abdomen  creasing at 3 and 9 o'clock around stoma  midline scar creating a deep ridge.   Musculoskeletal:        General: Normal range of motion.  Skin:    General: Skin is warm and dry.     Findings: Erythema and rash present.  Neurological:     Mental Status: She is alert and oriented to person, place, and time.  Psychiatric:        Behavior: Behavior normal.     Comments: Concern over leaking pouch      Stoma type/location:  RLQ ileostomy   Stomal assessment/size:   1" x 1 1/2" oval shape with deep creasing at 3 and 9 o'clock and midline surgical scar with deep creasing slightly budded, os of mucus fistula opens downward at 6 o'clock. Stoma is located in a circumferential valley, deepest along bottom half from 3 to 9 o'clock  Peristomal assessment:  peristoma irritation from medical adhesive and frequent pouch changes.   Treatment options for stomal/peristomal skin: can only use Eakin paste due to burning and irritation.  First, we apply stoma powder and skin prep x 2 layers.  Next paste.  Next barrier ring and 1 piece flexible convex  pouch.   Output: alternates between soft stool and thick pancaking stool that collects in pouch and contributes to loss of seal.   Ostomy pouching: 1pc. convex Education provided:  We have tried convatec, coloplast, hollister pouch systems.  Convex, deep convex and flexible convex.  Ostomy belt, hernia support belt, Stoma Gear stoma shield and all variations of accessories to improve seal that I know of.   TO heal skin and improve seal, I apply a small eakin pouch for maximum flexibility and patient immediately feels this will not work.  We remove this pouch and return back to the 1 piece convex hollister pouch with belt. I provide pamphlets for [employer]  WE have tried Ostoform device that diverts stool into pouch.  The thickness of her stool if contributing to this issue.   I had recommended starting 1 cap full miralax at last visit and she has tried 1/2 dose with no improvement. Encouraged to increase to recommended dosing to thin stool and avoid pancaking and loss of seal.     Impression/dx  Ileostomy complication Discussion  See above She is concerned about supplies with new calendar year and amount of out of pocket expense with excessive supplies being used.    We will continue to try other solutions and she agrees to try and thin out stool.  Plan  See back 2 weeks.    Visit time: 75 minutes.   Maple Hudson FNP-BC

## 2023-09-28 DIAGNOSIS — K9413 Enterostomy malfunction: Secondary | ICD-10-CM | POA: Insufficient documentation

## 2023-10-09 ENCOUNTER — Ambulatory Visit (HOSPITAL_COMMUNITY): Payer: PRIVATE HEALTH INSURANCE | Admitting: Nurse Practitioner

## 2023-10-16 ENCOUNTER — Ambulatory Visit (HOSPITAL_COMMUNITY)
Admission: RE | Admit: 2023-10-16 | Discharge: 2023-10-16 | Disposition: A | Discharge status: Home / Self Care | Payer: Medicare Other | Source: Ambulatory Visit | Attending: Nurse Practitioner | Admitting: Nurse Practitioner

## 2023-10-16 DIAGNOSIS — Z932 Ileostomy status: Secondary | ICD-10-CM | POA: Diagnosis present

## 2023-10-16 DIAGNOSIS — L24B3 Irritant contact dermatitis related to fecal or urinary stoma or fistula: Secondary | ICD-10-CM | POA: Insufficient documentation

## 2023-10-16 DIAGNOSIS — K435 Parastomal hernia without obstruction or  gangrene: Secondary | ICD-10-CM | POA: Diagnosis not present

## 2023-10-16 DIAGNOSIS — Z432 Encounter for attention to ileostomy: Secondary | ICD-10-CM

## 2023-10-16 NOTE — Discharge Instructions (Signed)
SLIM barrier rings  ITEM # [id number]

## 2023-10-16 NOTE — Progress Notes (Signed)
[facility]   Reason for visit:  RLQ ileostomy with peristomal creasing.  PArastomal hernia and frequent leaks.  Getting around 18-24 hours wear time. Irritant contact dermatitis ongoing HPI:  Ulcerative colitis with ileostomy over 30 years ago.  Recent abdominal surgery created changed in abdominal topography contributing to frequent leaks.  Past Medical History:  Diagnosis Date  . Anemia   . Asthma   . Blood transfusion   . Brain tumor (HCC) 1995  . Bruises easily   . Bursitis    left knee  . Colitis, ulcerative (HCC) 1969  . Fatigue   . Fibroid tumor 1982  . Hearing loss   . Kidney stone   . Ovarian cyst 1981  . Rash   . Sore throat   . Spasmodic dysphonia 1982  . Spasmodic torticollis 2007  . Ulcer   . Wears glasses    Family History  Problem Relation Age of Onset  . Cancer Mother        breast  . Food Allergy Mother   . Allergic rhinitis Mother   . Dementia Mother   . Hypertension Mother   . Glaucoma Mother   . Heart disease Father   . Gout Father   . Fibromyalgia Sister   . Hyperlipidemia Brother   . Throat cancer Brother   . Cancer Paternal Aunt        colon  . Diabetes Paternal Grandmother    Allergies  Allergen Reactions  . Latex Itching    Per patient  . Sulfa Antibiotics     OTHER REACTION: BLOATING   Current Outpatient Medications  Medication Sig Dispense Refill Last Dose  . Ascorbic Acid (VITAMIN C) 1000 MG tablet Take 1,000 mg by mouth daily.     Marland Kitchen CALCIUM CITRATE PO Take 1 tablet by mouth daily.     . Cholecalciferol 50 MCG (2000 UT) TABS Take 2,000 Units by mouth daily.     Marland Kitchen ELIQUIS 5 MG TABS tablet Take 5 mg by mouth 2 (two) times daily.     Marland Kitchen latanoprost (XALATAN) 0.005 % ophthalmic solution      . MAGNESIUM CITRATE PO Take 150 mg by mouth daily.     Marland Kitchen UNABLE TO FIND Take 1 capsule by mouth daily. Med Name: RA Probitoic Digestive Care     . UNABLE TO FIND Med Name: algae cal plus boost      No current  facility-administered medications for this encounter.   ROS  Review of Systems  Constitutional:  Positive for fatigue.  Gastrointestinal:        RLq lleostomy   Parasomtal hernia   Skin:  Positive for color change, rash and wound.       Peristomal skin irritation  Psychiatric/Behavioral:  Positive for agitation and sleep disturbance. The patient is nervous/anxious.        Anxiety that pouch will not stay adhered.   All other systems reviewed and are negative.  Vital signs:  There were no vitals taken for this visit. Exam:  Physical Exam Constitutional:      Appearance: Normal appearance.  Cardiovascular:     Rate and Rhythm: Normal rate and regular rhythm.  Abdominal:     Comments: Deep midline creasing  Parastomal hernia  Denuded skin around stoma  Skin:    General: Skin is warm and dry.     Findings: Erythema and rash present.  Neurological:     Mental Status: She is alert.    Stoma type/location:  RLQ ileostomy  mucus fistula at 6 o'clock, nearly below skin level, contributes to leaks and skin breakdown on lower half of pouching area.  Stomal assessment/size:  1" x 1 1/2" oval flush  midline surgical scar with deep creasing.  Peristomal assessment:  denuded skin on lower half.  Moist and weeping.  Treatment options for stomal/peristomal skin: stoma powder and skin prep   eakin paste (preferred over barrier ring) we discuss thin barrier rings today. She feels like original rings are too thick but is willing to try thin rings.    1 piece flexible convex pouch Output: thick pasty stool. She has made an appointment with a GI specialist regarding thick stool  has not tried daily miralax as suggested.   Ostomy pouching: 1pc. Flexible convex Education provided:  we perform pouch change today.  She has resigned from her job due to distress over leaks.  WE will continue to try and find a solution.     Impression/dx  Contact dermatitis  RLQ ileostomy Discussion  See back as  needed Plan  Continue to work on thinning out stool.     Visit time: 55 minutes.   Maple Hudson FNP-BC

## 2023-10-29 ENCOUNTER — Ambulatory Visit (HOSPITAL_COMMUNITY): Payer: PRIVATE HEALTH INSURANCE | Admitting: Nurse Practitioner

## 2024-01-09 ENCOUNTER — Telehealth: Payer: Self-pay | Admitting: Internal Medicine

## 2024-01-09 NOTE — Telephone Encounter (Signed)
Good afternoon Dr. Leonides Schanz,    We received a referral for patient to specifically be seen with you for left side abdominal pain and nausea. Patient was previously seen with [facility]. Patient is requesting a second opinion. Please review and advise on request.    Thank you.

## 2024-01-09 NOTE — Telephone Encounter (Signed)
Inbound call from referring office, confirming if referral was received. Advised them it was not received, they stated patient wanted a second opinion for imaging. Wanted Dr. Leonides Schanz.

## 2024-01-10 NOTE — Telephone Encounter (Signed)
Patient advised.

## 2024-02-26 ENCOUNTER — Ambulatory Visit (INDEPENDENT_AMBULATORY_CARE_PROVIDER_SITE_OTHER): Payer: Medicare Other | Admitting: Family Medicine

## 2024-02-26 ENCOUNTER — Encounter: Payer: Self-pay | Admitting: Family Medicine

## 2024-02-26 VITALS — BP 110/80 | HR 79 | Temp 98.0°F | Resp 18 | Ht <= 58 in | Wt 146.2 lb

## 2024-02-26 DIAGNOSIS — R109 Unspecified abdominal pain: Secondary | ICD-10-CM | POA: Diagnosis not present

## 2024-02-26 NOTE — Progress Notes (Signed)
 New Patient Office Visit  Subjective    Patient ID: Cindy Dudley, female    DOB: [birth year]  Age: 73 y.o. MRN: [id number]  CC:  Chief Complaint  Patient presents with   New Patient (Initial Visit)    Pt states here to get established and would like a referral to GI for upper abdominal pain.     HPI Cindy Dudley presents to establish care Discussed the use of AI scribe software for clinical note transcription with the patient, who gave verbal consent to proceed.  History of Present Illness Cindy Dudley is a 73 year old female with ovarian cancer and ostomy issues who presents for primary care establishment and evaluation of abdominal and groin pain. She is accompanied by her husband.  She has experienced persistent abdominal tenderness since December, following surgery for ovarian cancer in May of the previous year. The tenderness is localized to the stomach area and is elicited by pressure. She also reports groin discomfort described as inflammation, which worsens with walking and sitting. Despite efforts, she has been unable to secure a timely appointment with a gastroenterologist. Previous consultations have yielded conflicting opinions regarding the presence of a hernia, with some suggesting inflammation or infection as potential causes. A CT scan in July revealed a fat-containing right inguinal hernia, but its significance remains debated among her providers.  She has ongoing issues with her ostomy since the surgery, initially requiring changes up to five times a day, now reduced to one or two times daily. 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 She attributes the problems to scarring and indentations from the surgery, affecting the appliance's adherence. Consultations with multiple wound centers and a plastic surgeon have not resolved the issue. She is pursuing weight loss management to potentially aid with the ostomy  issues.  Her past medical history includes acute heart failure post-surgery, brain surgery, and a long history of colitis. She follows a vegan vegetarian diet and is proactive about her health. She reports a gradual return of sexual sensation post-surgery, for which she is using a cream prescribed by her gynecological oncologist.  She is currently not on any regular medications but has been advised to use Miralax and an antacid for her gastrointestinal symptoms. She has had a biopsy post-surgery, which was reportedly normal, and she is awaiting further diagnostic evaluations.    Outpatient Encounter Medications as of 02/26/2024  Medication Sig   Ascorbic Acid (VITAMIN C) 1000 MG tablet Take 1,000 mg by mouth daily.   CALCIUM CITRATE PO Take 1 tablet by mouth daily.   Cholecalciferol 50 MCG (2000 UT) TABS Take 2,000 Units by mouth daily.   ELIQUIS 5 MG TABS tablet Take 5 mg by mouth 2 (two) times daily.   latanoprost (XALATAN) 0.005 % ophthalmic solution    MAGNESIUM CITRATE PO Take 150 mg by mouth daily.   UNABLE TO FIND Take 1 capsule by mouth daily. Med Name: RA Probitoic Digestive Care   UNABLE TO FIND Med Name: algae cal plus boost   No facility-administered encounter medications on file as of 02/26/2024.    Past Medical History:  Diagnosis Date   Anemia    Asthma    Blood transfusion    Brain tumor (HCC) 1995   Bruises easily    Bursitis    left knee   Colitis, ulcerative (HCC) 1969   Fatigue    Fibroid tumor 1982   Hearing loss  Kidney stone    Ovarian cyst 1981   Rash    Sore throat    Spasmodic dysphonia 1982   Spasmodic torticollis 2007   Ulcer    Wears glasses     Past Surgical History:  Procedure Laterality Date   ABDOMINAL HYSTERECTOMY  1982   BRAIN TUMOR EXCISION  1995   CHOLECYSTECTOMY  2016   ILEOSTOMY  1982   LARYNX SURGERY  2000   OVARIAN CYST REMOVAL      Family History  Problem Relation Age of Onset   Cancer Mother        breast   Food  Allergy Mother    Allergic rhinitis Mother    Dementia Mother    Hypertension Mother    Glaucoma Mother    Heart disease Father    Gout Father    Fibromyalgia Sister    Hyperlipidemia Brother    Throat cancer Brother    Cancer Paternal Aunt        colon   Diabetes Paternal Grandmother     Social History   Socioeconomic History   Marital status: Married    Spouse name: Not on file   Number of children: Not on file   Years of education: Not on file   Highest education level: Associate degree: occupational, Scientist, product/process development, or vocational program  Occupational History   Not on file  Tobacco Use   Smoking status: Never   Smokeless tobacco: Never  Substance and Sexual Activity   Alcohol use: No   Drug use: No   Sexual activity: Not on file  Other Topics Concern   Not on file  Social History Narrative   Lives home with husband.  Works for [employer].  Education college (6 months) for C[employer].   Children 3.     Social Drivers of Corporate investment banker Strain: Low Risk  (02/20/2024)   Overall Financial Resource Strain (CARDIA)    Difficulty of Paying Living Expenses: Not very hard  Food Insecurity: No Food Insecurity (02/20/2024)   Hunger Vital Sign    Worried About Running Out of Food in the Last Year: Never true    Ran Out of Food in the Last Year: Never true  Transportation Needs: No Transportation Needs (02/20/2024)   PRAPARE - Administrator, Civil Service (Medical): No    Lack of Transportation (Non-Medical): No  Physical Activity: Unknown (02/20/2024)   Exercise Vital Sign    Days of Exercise per Week: Patient declined    Minutes of Exercise per Session: Not on file  Stress: No Stress Concern Present (02/20/2024)   [employer] of Occupational Health - Occupational Stress Questionnaire    Feeling of Stress : Only a little  Social Connections: Moderately Integrated (02/20/2024)   Social Connection and Isolation Panel [NHANES]    Frequency of  Communication with Friends and Family: More than three times a week    Frequency of Social Gatherings with Friends and Family: Patient declined    Attends Religious Services: More than 4 times per year    Active Member of [employer] or Organizations: No    Attends Engineer, structural: Not on file    Marital Status: Married  Catering manager Violence: Not on file    Review of Systems  Constitutional:  Negative for fever and malaise/fatigue.  HENT:  Negative for congestion.   Eyes:  Negative for blurred vision.  Respiratory:  Negative for cough and shortness of breath.   Cardiovascular:  Negative for chest pain, palpitations and leg swelling.  Gastrointestinal:  Positive for abdominal pain. Negative for nausea and vomiting.  Musculoskeletal:  Negative for back pain.  Skin:  Negative for rash.  Neurological:  Negative for loss of consciousness and headaches.        Objective    BP 110/80 (BP Location: Left Arm, Patient Position: Sitting, Cuff Size: Normal)   Pulse 79   Temp 98 F (36.7 C) (Oral)   Resp 18   Ht 4\' 9"  (1.448 m)   Wt 146 lb 3.2 oz (66.3 kg)   SpO2 97%   BMI 31.64 kg/m   Physical Exam Vitals and nursing note reviewed.  Constitutional:      General: She is not in acute distress.    Appearance: Normal appearance. She is well-developed.  HENT:     Head: Normocephalic and atraumatic.  Eyes:     General: No scleral icterus.       Right eye: No discharge.        Left eye: No discharge.  Cardiovascular:     Rate and Rhythm: Normal rate and regular rhythm.     Heart sounds: No murmur heard. Pulmonary:     Effort: Pulmonary effort is normal. No respiratory distress.     Breath sounds: Normal breath sounds.  Abdominal:     Tenderness: There is abdominal tenderness. There is no guarding or rebound.       Comments: Tenderness below umbilicus to left of center  No R/G/T  Musculoskeletal:        General: Normal range of motion.     Cervical back: Normal  range of motion and neck supple.     Right lower leg: No edema.     Left lower leg: No edema.  Skin:    General: Skin is warm and dry.  Neurological:     Mental Status: She is alert and oriented to person, place, and time.  Psychiatric:        Mood and Affect: Mood normal.        Behavior: Behavior normal.        Thought Content: Thought content normal.        Judgment: Judgment normal.    Last CBC Lab Results  Component Value Date   WBC 11.4 (H) 06/11/2023   HGB 12.1 06/11/2023   HCT 37.9 06/11/2023   MCV 91.3 06/11/2023   MCH 29.2 06/11/2023   RDW 15.3 06/11/2023   PLT 363 06/11/2023   Last metabolic panel Lab Results  Component Value Date   GLUCOSE 133 (H) 06/11/2023   NA 135 06/11/2023   K 4.0 06/11/2023   CL 102 06/11/2023   CO2 22 06/11/2023   BUN 6 (L) 06/11/2023   CREATININE 0.76 06/11/2023   GFRNONAA >60 06/11/2023   CALCIUM 9.1 06/11/2023   PROT 6.7 06/11/2023   ALBUMIN 3.2 (L) 06/11/2023   LABGLOB 2.2 08/07/2022   AGRATIO 1.9 08/07/2022   BILITOT 0.6 06/11/2023   ALKPHOS 90 06/11/2023   AST 28 06/11/2023   ALT 25 06/11/2023   ANIONGAP 11 06/11/2023   Last lipids No results found for: "CHOL", "HDL", "LDLCALC", "LDLDIRECT", "TRIG", "CHOLHDL" Last hemoglobin A1c No results found for: "HGBA1C" Last thyroid functions Lab Results  Component Value Date   TSH 2.784 06/11/2023   Last vitamin D No results found for: "25OHVITD2", "25OHVITD3", "VD25OH" Last vitamin B12 and Folate No results found for: "VITAMINB12", "FOLATE"      Assessment & Plan:  Problem List Items Addressed This Visit   None Visit Diagnoses       Abdominal pain, unspecified abdominal location    -  Primary   Relevant Orders   US Abdomen Complete   Ambulatory referral to Gastroenterology     Assessment and Plan Assessment & Plan Abdominal Pain   Chronic abdominal pain and tenderness have persisted since December, following ovarian tumor surgery in May. The pain is  localized in the stomach area and worsens with pressure. Previous CT scans have not identified definitive causes, with conflicting reports of hernia and inflammation. Differential diagnosis includes hernia, infection, or inflammation. She has been unable to secure a GI consultation due to long wait times and previous refusals. Order an abdominal ultrasound to evaluate for infection or inflammation. Consider an MRI if ultrasound findings are inconclusive. Attempt to secure a GI consultation for further evaluation.  Groin Pain   Chronic groin pain has been present before and after ovarian tumor surgery, worsening with sitting and walking, suggesting possible inflammation. A previous CT scan indicated a fat-containing right inguinal hernia, but specialists disagree on its significance. Differential diagnosis includes hernia or inflammation. Order an abdominal ultrasound to evaluate for hernia or inflammation. Consider an MRI if ultrasound findings are inconclusive.  Ostomy Complications   Post-surgical complications with ostomy management include frequent leakage and the need for frequent changes due to scarring and indentations around the ostomy site. Weight loss management is being pursued as a potential solution. Previous surgical interventions were declined by a Engineer, petroleum. She is experiencing difficulty obtaining ostomy supplies due to insurance and supplier issues. Continue the weight loss management program and coordinate with wound care for ostomy supplies and management.  Sexual Dysfunction   Loss of sexual sensation and arousal followed ovarian tumor surgery. She reports gradual improvement and is using a topical cream prescribed by Dr. Bryson Ha. Continue using the prescribed topical cream for three months to assess improvement.    Return if symptoms worsen or fail to improve, for annual exam, fasting.   Donato Schultz, DO

## 2024-02-26 NOTE — Patient Instructions (Signed)
 Abdominal Pain, Adult  Pain in the abdomen (abdominal pain) can be caused by many things. In most cases, it gets better with no treatment or by being treated at home. But in some cases, it can be serious. Your health care provider will ask questions about your medical history and do a physical exam to try to figure out what is causing your pain. Follow these instructions at home: Medicines Take over-the-counter and prescription medicines only as told by your provider. Do not take medicines that help you poop (laxatives) unless told by your provider. General instructions Watch your condition for any changes. Drink enough fluid to keep your pee (urine) pale yellow. Contact a health care provider if: Your pain changes, gets worse, or lasts longer than expected. You have severe cramping or bloating in your abdomen, or you vomit. Your pain gets worse with meals, after eating, or with certain foods. You are constipated or have diarrhea for more than 2-3 days. You are not hungry, or you lose weight without trying. You have signs of dehydration. These may include: Dark pee, very little pee, or no pee. Cracked lips or dry mouth. Sleepiness or weakness. You have pain when you pee (urinate) or poop. Your abdominal pain wakes you up at night. You have blood in your pee. You have a fever. Get help right away if: You cannot stop vomiting. Your pain is only in one part of the abdomen. Pain on the right side could be caused by appendicitis. You have bloody or black poop (stool), or poop that looks like tar. You have trouble breathing. You have chest pain. These symptoms may be an emergency. Get help right away. Call 911. Do not wait to see if the symptoms will go away. Do not drive yourself to the hospital. This information is not intended to replace advice given to you by your health care provider. Make sure you discuss any questions you have with your health care provider. Document Revised:  08/30/2022 Document Reviewed: 08/30/2022 Elsevier Patient Education  2024 [employer].

## 2024-03-02 ENCOUNTER — Ambulatory Visit (HOSPITAL_BASED_OUTPATIENT_CLINIC_OR_DEPARTMENT_OTHER)
Admission: RE | Admit: 2024-03-02 | Discharge: 2024-03-02 | Disposition: A | Discharge status: Home / Self Care | Source: Ambulatory Visit | Attending: Family Medicine | Admitting: Family Medicine

## 2024-03-02 DIAGNOSIS — R109 Unspecified abdominal pain: Secondary | ICD-10-CM | POA: Diagnosis present

## 2024-03-05 ENCOUNTER — Encounter: Payer: Self-pay | Admitting: Family Medicine

## 2024-03-13 ENCOUNTER — Telehealth: Payer: Self-pay | Admitting: Family Medicine

## 2024-03-13 DIAGNOSIS — R109 Unspecified abdominal pain: Secondary | ICD-10-CM

## 2024-03-13 NOTE — Addendum Note (Signed)
 Addended by: Ysidro Her on: 03/13/2024 02:49 PM   Modules accepted: Orders

## 2024-03-13 NOTE — Telephone Encounter (Signed)
 Copied from CRM [id number]. Topic: Referral - Question >> Mar 13, 2024 12:09 PM Adaysia C wrote: Reason for CRM: Patient has requested a follow up call regarding her referral that was suppose to be put in on 02/27/2024; patient has not received a call from the referred facility; please follow up with patient [phone]

## 2024-03-19 ENCOUNTER — Telehealth: Payer: Self-pay | Admitting: Family Medicine

## 2024-03-19 NOTE — Telephone Encounter (Signed)
 Copied from CRM [id number]. Topic: Medicare AWV >> Mar 19, 2024  3:12 PM Juliana Ocean wrote: Reason for CRM: LVM 03/19/2024 to schedule AWV. Please schedule Virtual or Telehealth visits ONLY.   Rosalee Collins; Care Guide Ambulatory Clinical Support [place] [facility] Direct Dial: [phone]

## 2024-03-25 ENCOUNTER — Ambulatory Visit: Admitting: Family Medicine

## 2024-03-25 ENCOUNTER — Encounter: Payer: Self-pay | Admitting: Family Medicine

## 2024-03-25 VITALS — BP 110/62 | HR 77 | Temp 98.3°F | Resp 16 | Ht <= 58 in | Wt 146.6 lb

## 2024-03-25 DIAGNOSIS — E041 Nontoxic single thyroid nodule: Secondary | ICD-10-CM

## 2024-03-25 DIAGNOSIS — E782 Mixed hyperlipidemia: Secondary | ICD-10-CM

## 2024-03-25 DIAGNOSIS — K279 Peptic ulcer, site unspecified, unspecified as acute or chronic, without hemorrhage or perforation: Secondary | ICD-10-CM | POA: Diagnosis not present

## 2024-03-25 LAB — CBC WITH DIFFERENTIAL/PLATELET
Basophils Absolute: 0 10*3/uL (ref 0.0–0.1)
Basophils Relative: 0.4 % (ref 0.0–3.0)
Eosinophils Absolute: 0.1 10*3/uL (ref 0.0–0.7)
Eosinophils Relative: 2.3 % (ref 0.0–5.0)
HCT: 43.1 % (ref 36.0–46.0)
Hemoglobin: 14.5 g/dL (ref 12.0–15.0)
Lymphocytes Relative: 11.9 % — ABNORMAL LOW (ref 12.0–46.0)
Lymphs Abs: 0.7 10*3/uL (ref 0.7–4.0)
MCHC: 33.7 g/dL (ref 30.0–36.0)
MCV: 93.3 fl (ref 78.0–100.0)
Monocytes Absolute: 0.5 10*3/uL (ref 0.1–1.0)
Monocytes Relative: 8.4 % (ref 3.0–12.0)
Neutro Abs: 4.7 10*3/uL (ref 1.4–7.7)
Neutrophils Relative %: 77 % (ref 43.0–77.0)
Platelets: 305 10*3/uL (ref 150.0–400.0)
RBC: 4.62 Mil/uL (ref 3.87–5.11)
RDW: 13.2 % (ref 11.5–15.5)
WBC: 6.1 10*3/uL (ref 4.0–10.5)

## 2024-03-25 LAB — COMPREHENSIVE METABOLIC PANEL WITH GFR
ALT: 27 U/L (ref 0–35)
AST: 26 U/L (ref 0–37)
Albumin: 3.9 g/dL (ref 3.5–5.2)
Alkaline Phosphatase: 112 U/L (ref 39–117)
BUN: 13 mg/dL (ref 6–23)
CO2: 32 meq/L (ref 19–32)
Calcium: 9.1 mg/dL (ref 8.4–10.5)
Chloride: 102 meq/L (ref 96–112)
Creatinine, Ser: 0.75 mg/dL (ref 0.40–1.20)
GFR: 79.3 mL/min (ref >60.00)
Glucose, Bld: 101 mg/dL — ABNORMAL HIGH (ref 70–99)
Potassium: 4.2 meq/L (ref 3.5–5.1)
Sodium: 140 meq/L (ref 135–145)
Total Bilirubin: 0.5 mg/dL (ref 0.2–1.2)
Total Protein: 6.1 g/dL (ref 6.0–8.3)

## 2024-03-25 LAB — LIPID PANEL
Cholesterol: 129 mg/dL (ref 0–200)
HDL: 58.4 mg/dL (ref >39.00)
LDL Cholesterol: 3 mg/dL (ref 0–99)
NonHDL: 70.76
Total CHOL/HDL Ratio: 2
Triglycerides: 338 mg/dL — ABNORMAL HIGH (ref 0.0–149.0)
VLDL: 67.6 mg/dL — ABNORMAL HIGH (ref 0.0–40.0)

## 2024-03-25 LAB — TSH: TSH: 1.9 u[IU]/mL (ref 0.35–5.50)

## 2024-03-25 NOTE — Progress Notes (Signed)
 Established Patient Office Visit  Subjective   Patient ID: Cindy Dudley, female    DOB: [birth year]  Age: 73 y.o. MRN: [id number]  Chief Complaint  Patient presents with   Annual Exam    Pt states not fasting     HPI Discussed the use of AI scribe software for clinical note transcription with the patient, who gave verbal consent to proceed.  History of Present Illness Cindy Dudley is a 73 year old female who presents with abdominal and groin pain, suspected to be due to a hernia.  She has been experiencing tenderness in the abdomen and pain in the groin, which has been attributed to a hernia by a general surgeon. She has an upcoming appointment with another general surgeon on May 21st for further evaluation and discussion of potential surgical intervention. The groin pain worsens postprandially, raising concerns about a possible blockage. This pain has persisted since December when it was first identified during a medical examination. There is confusion due to conflicting medical opinions regarding the presence of a hernia.  She has a history of upper abdominal pain and underwent an upper endoscopy in December, revealing mild gastritis and evidence of a previous ulcer without active ulcers. She is awaiting a referral to a gastroenterologist for further evaluation of her upper abdominal pain.  She recently started a new bladder medication but is concerned about its side effects, as she experienced a backache after the first dose. She has a history of overactive bladder attributed to scar tissue from surgery in 1982. No current urinary symptoms are reported.  She experiences joint issues, particularly in her thumb, described as 'trigger finger' that clicks and gets stuck. She occasionally uses Tylenol and cream for relief, though they are not very effective.  She is up to date with her mammogram and bone density tests, having had them done on November 14, 2023, at [facility]. She also regularly visits the dentist and eye doctor.   Patient Active Problem List   Diagnosis Date Noted   Ileostomy stenosis (HCC) 09/28/2023   Irritant contact dermatitis associated with fecal stoma 08/26/2023   Ileostomy care (HCC) 08/26/2023   Titubation 08/07/2022   Bilateral hearing loss 08/07/2022   Benign meningioma of brain (HCC) 09/18/2018   Hereditary essential tremor 09/18/2018   Thyroid  nodule, uninodular, left 10/17/2011   Past Medical History:  Diagnosis Date   Anemia    Asthma    Blood transfusion    Brain tumor (HCC) 1995   Bruises easily    Bursitis    left knee   Colitis, ulcerative (HCC) 1969   Fatigue    Fibroid tumor 1982   Hearing loss    Kidney stone    Ovarian cyst 1981   Rash    Sore throat    Spasmodic dysphonia 1982   Spasmodic torticollis 2007   Ulcer    Wears glasses    Past Surgical History:  Procedure Laterality Date   ABDOMINAL HYSTERECTOMY  1982   BRAIN TUMOR EXCISION  1995   CHOLECYSTECTOMY  2016   ILEOSTOMY  1982   LARYNX SURGERY  2000   OVARIAN CYST REMOVAL     Social History   Tobacco Use   Smoking status: Never   Smokeless tobacco: Never  Substance Use Topics   Alcohol  use: No   Drug use: No  Social History   Socioeconomic History   Marital status: Married    Spouse name: Not on file   Number of children: Not on file   Years of education: Not on file   Highest education level: Associate degree: occupational, Scientist, product/process development, or vocational program  Occupational History   Not on file  Tobacco Use   Smoking status: Never   Smokeless tobacco: Never  Substance and Sexual Activity   Alcohol  use: No   Drug use: No   Sexual activity: Not on file  Other Topics Concern   Not on file  Social History Narrative   Lives home with husband.  Works for [employer].  Education college (6 months) for CMA.   Children 3.     Social Drivers of Corporate investment banker Strain: Low Risk   (02/20/2024)   Overall Financial Resource Strain (CARDIA)    Difficulty of Paying Living Expenses: Not very hard  Food Insecurity: No Food Insecurity (02/20/2024)   Hunger Vital Sign    Worried About Running Out of Food in the Last Year: Never true    Ran Out of Food in the Last Year: Never true  Transportation Needs: No Transportation Needs (02/20/2024)   PRAPARE - Administrator, Civil Service (Medical): No    Lack of Transportation (Non-Medical): No  Physical Activity: Unknown (02/20/2024)   Exercise Vital Sign    Days of Exercise per Week: Patient declined    Minutes of Exercise per Session: Not on file  Stress: No Stress Concern Present (02/20/2024)   [employer] of Occupational Health - Occupational Stress Questionnaire    Feeling of Stress : Only a little  Social Connections: Moderately Integrated (02/20/2024)   Social Connection and Isolation Panel [NHANES]    Frequency of Communication with Friends and Family: More than three times a week    Frequency of Social Gatherings with Friends and Family: Patient declined    Attends Religious Services: More than 4 times per year    Active Member of [employer] or Organizations: No    Attends Engineer, structural: Not on file    Marital Status: Married  Catering manager Violence: Not on file   Family Status  Relation Name Status   Mother  Deceased       breast cancer, dementia   Father  Deceased       heart disease   Sister 3 Alive       fibromyalgia, lime disease   Brother 1 Alive   Statistician  (Not Specified)       PD   PGM  (Not Specified)  No partnership data on file   Family History  Problem Relation Age of Onset   Cancer Mother        breast   Food Allergy Mother    Allergic rhinitis Mother    Dementia Mother    Hypertension Mother    Glaucoma Mother    Heart disease Father    Gout Father    Fibromyalgia Sister    Hyperlipidemia Brother    Throat cancer Brother    Cancer  Paternal Aunt        colon   Diabetes Paternal Grandmother    Allergies  Allergen Reactions   Latex Itching    Per patient   Sulfa Antibiotics     OTHER REACTION: BLOATING   Tape Rash      Review of Systems  Constitutional:  Negative for chills, fever and malaise/fatigue.  HENT:  Negative for congestion and hearing loss.   Eyes:  Negative for blurred vision and discharge.  Respiratory:  Negative for cough, sputum production and shortness of breath.   Cardiovascular:  Negative for chest pain, palpitations and leg swelling.  Gastrointestinal:  Negative for abdominal pain, blood in stool, constipation, diarrhea, heartburn, nausea and vomiting.  Genitourinary:  Negative for dysuria, frequency, hematuria and urgency.  Musculoskeletal:  Negative for back pain, falls and myalgias.  Skin:  Negative for rash.  Neurological:  Negative for dizziness, sensory change, loss of consciousness, weakness and headaches.  Endo/Heme/Allergies:  Negative for environmental allergies. Does not bruise/bleed easily.  Psychiatric/Behavioral:  Negative for depression and suicidal ideas. The patient is not nervous/anxious and does not have insomnia.       Objective:     BP 110/62 (BP Location: Left Arm, Patient Position: Sitting, Cuff Size: Normal)   Pulse 77   Temp 98.3 F (36.8 C) (Oral)   Resp 16   Ht 4\' 9"  (1.448 m)   Wt 146 lb 9.6 oz (66.5 kg)   SpO2 96%   BMI 31.72 kg/m  BP Readings from Last 3 Encounters:  03/25/24 110/62  02/26/24 110/80  09/10/23 109/74   Wt Readings from Last 3 Encounters:  03/25/24 146 lb 9.6 oz (66.5 kg)  02/26/24 146 lb 3.2 oz (66.3 kg)  08/08/23 150 lb (68 kg)   SpO2 Readings from Last 3 Encounters:  03/25/24 96%  02/26/24 97%  09/10/23 98%      Physical Exam Vitals and nursing note reviewed.  Constitutional:      General: She is not in acute distress.    Appearance: Normal appearance. She is well-developed.  HENT:     Head: Normocephalic and  atraumatic.     Right Ear: Tympanic membrane, ear canal and external ear normal. There is no impacted cerumen.     Left Ear: Tympanic membrane, ear canal and external ear normal. There is no impacted cerumen.     Nose: Nose normal.     Mouth/Throat:     Mouth: Mucous membranes are moist.     Pharynx: Oropharynx is clear. No oropharyngeal exudate or posterior oropharyngeal erythema.  Eyes:     General: No scleral icterus.       Right eye: No discharge.        Left eye: No discharge.     Conjunctiva/sclera: Conjunctivae normal.     Pupils: Pupils are equal, round, and reactive to light.  Neck:     Thyroid : No thyromegaly or thyroid  tenderness.     Vascular: No JVD.  Cardiovascular:     Rate and Rhythm: Normal rate and regular rhythm.     Heart sounds: Normal heart sounds. No murmur heard. Pulmonary:     Effort: Pulmonary effort is normal. No respiratory distress.     Breath sounds: Normal breath sounds.  Abdominal:     General: Bowel sounds are normal. There is no distension.     Palpations: Abdomen is soft. There is no mass.     Tenderness: There is no abdominal tenderness. There is no guarding or rebound.  Genitourinary:    Vagina: Normal.  Musculoskeletal:        General: Normal range of motion.     Cervical back: Normal range of motion and neck supple.     Right lower leg: No edema.     Left lower leg: No edema.  Lymphadenopathy:  Cervical: No cervical adenopathy.  Skin:    General: Skin is warm and dry.     Findings: No erythema or rash.  Neurological:     Mental Status: She is alert and oriented to person, place, and time.     Cranial Nerves: No cranial nerve deficit.     Deep Tendon Reflexes: Reflexes are normal and symmetric.  Psychiatric:        Mood and Affect: Mood normal.        Behavior: Behavior normal.        Thought Content: Thought content normal.        Judgment: Judgment normal.      Results for orders placed or performed in visit on 03/25/24   CBC with Differential/Platelet  Result Value Ref Range   WBC 6.1 4.0 - 10.5 K/uL   RBC 4.62 3.87 - 5.11 Mil/uL   Hemoglobin 14.5 12.0 - 15.0 g/dL   HCT 11.9 14.7 - 82.9 %   MCV 93.3 78.0 - 100.0 fl   MCHC 33.7 30.0 - 36.0 g/dL   RDW 56.2 13.0 - 86.5 %   Platelets 305.0 150.0 - 400.0 K/uL   Neutrophils Relative % 77.0 43.0 - 77.0 %   Lymphocytes Relative 11.9 (L) 12.0 - 46.0 %   Monocytes Relative 8.4 3.0 - 12.0 %   Eosinophils Relative 2.3 0.0 - 5.0 %   Basophils Relative 0.4 0.0 - 3.0 %   Neutro Abs 4.7 1.4 - 7.7 K/uL   Lymphs Abs 0.7 0.7 - 4.0 K/uL   Monocytes Absolute 0.5 0.1 - 1.0 K/uL   Eosinophils Absolute 0.1 0.0 - 0.7 K/uL   Basophils Absolute 0.0 0.0 - 0.1 K/uL  Comprehensive metabolic panel with GFR  Result Value Ref Range   Sodium 140 135 - 145 mEq/L   Potassium 4.2 3.5 - 5.1 mEq/L   Chloride 102 96 - 112 mEq/L   CO2 32 19 - 32 mEq/L   Glucose, Bld 101 (H) 70 - 99 mg/dL   BUN 13 6 - 23 mg/dL   Creatinine, Ser 7.84 0.40 - 1.20 mg/dL   Total Bilirubin 0.5 0.2 - 1.2 mg/dL   Alkaline Phosphatase 112 39 - 117 U/L   AST 26 0 - 37 U/L   ALT 27 0 - 35 U/L   Total Protein 6.1 6.0 - 8.3 g/dL   Albumin 3.9 3.5 - 5.2 g/dL   GFR 69.62 >95.28 mL/min   Calcium 9.1 8.4 - 10.5 mg/dL  Lipid panel  Result Value Ref Range   Cholesterol 129 0 - 200 mg/dL   Triglycerides 413.2 (H) 0.0 - 149.0 mg/dL   HDL 44.01 >02.72 mg/dL   VLDL 53.6 (H) 0.0 - 64.4 mg/dL   LDL Cholesterol 3 0 - 99 mg/dL   Total CHOL/HDL Ratio 2    NonHDL 70.76   TSH  Result Value Ref Range   TSH 1.90 0.35 - 5.50 uIU/mL    Last CBC Lab Results  Component Value Date   WBC 6.1 03/25/2024   HGB 14.5 03/25/2024   HCT 43.1 03/25/2024   MCV 93.3 03/25/2024   MCH 29.2 06/11/2023   RDW 13.2 03/25/2024   PLT 305.0 03/25/2024   Last metabolic panel Lab Results  Component Value Date   GLUCOSE 101 (H) 03/25/2024   NA 140 03/25/2024   K 4.2 03/25/2024   CL 102 03/25/2024   CO2 32 03/25/2024   BUN 13  03/25/2024   CREATININE 0.75 03/25/2024   GFR 79.30  03/25/2024   CALCIUM 9.1 03/25/2024   PROT 6.1 03/25/2024   ALBUMIN 3.9 03/25/2024   LABGLOB 2.2 08/07/2022   AGRATIO 1.9 08/07/2022   BILITOT 0.5 03/25/2024   ALKPHOS 112 03/25/2024   AST 26 03/25/2024   ALT 27 03/25/2024   ANIONGAP 11 06/11/2023   Last lipids Lab Results  Component Value Date   CHOL 129 03/25/2024   HDL 58.40 03/25/2024   LDLCALC 3 03/25/2024   TRIG 338.0 (H) 03/25/2024   CHOLHDL 2 03/25/2024   Last hemoglobin A1c No results found for: "HGBA1C" Last thyroid  functions Lab Results  Component Value Date   TSH 1.90 03/25/2024   Last vitamin D No results found for: "25OHVITD2", "25OHVITD3", "VD25OH" Last vitamin B12 and Folate No results found for: "VITAMINB12", "FOLATE"    The ASCVD Risk score (Arnett DK, et al., 2019) failed to calculate for the following reasons:   The valid total cholesterol range is 130 to 320 mg/dL    Assessment & Plan:   Problem List Items Addressed This Visit   None Visit Diagnoses       Mixed hyperlipidemia    -  Primary   Relevant Medications   atorvastatin (LIPITOR) 10 MG tablet   Other Relevant Orders   CBC with Differential/Platelet (Completed)   Comprehensive metabolic panel with GFR (Completed)   Lipid panel (Completed)     Thyroid  nodule       Relevant Orders   TSH (Completed)     PUD (peptic ulcer disease)       Relevant Orders   Ambulatory referral to Gastroenterology     Assessment and Plan Assessment & Plan Overactive bladder   She started a new bladder medication but is concerned about side effects, including back pain. Her previous urologist is inconveniently located. She is willing to try the medication and assess its effectiveness before considering a referral to a more conveniently located urologist. Monitor response to the current medication and assess for side effects. Consider referral to a more conveniently located urologist if the medication  is ineffective or side effects persist.  Hernia   Diagnosed with a hernia by a general surgeon, she experiences pain in both groins. Confusion exists due to conflicting diagnoses from different providers. She is referred to another general surgeon for further evaluation and potential surgical intervention. Proceed with the consultation with the referred general surgeon on May 21 for further evaluation and management.  Trigger finger   Symptoms consistent with trigger finger include clicking and locking of the thumb. Previous episodes were managed with physical therapy and hot wax treatment. She is considering home-based hot wax treatment for symptom relief.  Ulcer   She has a history of mild gastritis and a previous ulcer, with no active ulcers in the last endoscopy in December. Current abdominal pain may warrant further evaluation by a gastroenterologist. Follow up on the GI referral for further evaluation of abdominal pain and potential ulcer recurrence.  Wellness Visit   A routine wellness visit was conducted. She is up to date with mammogram and bone density screenings as of November 14, 2023. Vaccinations are current. No new health issues have been reported since the last visit. Continue routine health maintenance and screenings as per guidelines.    Return in about 6 months (around 09/24/2024).    Tameka Hoiland R Lowne Chase, DO

## 2024-03-29 ENCOUNTER — Encounter: Payer: Self-pay | Admitting: Family Medicine

## 2024-04-01 ENCOUNTER — Telehealth: Payer: Self-pay

## 2024-04-01 DIAGNOSIS — R6882 Decreased libido: Secondary | ICD-10-CM

## 2024-04-01 NOTE — Addendum Note (Signed)
 Addended by: Ysidro Her on: 04/01/2024 02:16 PM   Modules accepted: Orders

## 2024-04-01 NOTE — Telephone Encounter (Signed)
 Spoke with patient. Pt states she would like a referral to gyn for low libido. Okay to place?

## 2024-04-01 NOTE — Telephone Encounter (Signed)
 Copied from CRM [id number]. Topic: General - Other >> Apr 01, 2024  9:12 AM Annelle Kiel wrote: Reason for CRM: patient is requesting a referral to the obgyn she stated she can be reached on her cell phone today if needed

## 2024-04-08 ENCOUNTER — Ambulatory Visit

## 2024-04-24 ENCOUNTER — Other Ambulatory Visit: Payer: Self-pay | Admitting: Family Medicine

## 2024-04-24 NOTE — Telephone Encounter (Unsigned)
 Copied from CRM [id number]. Topic: Clinical - Medication Refill >> Apr 24, 2024  2:18 PM Alyse July wrote: Medication:  atorvastatin (LIPITOR) 10 MG tablet  ELIQUIS 5 MG TABS tablet  Has the patient contacted their pharmacy? Yes  This is the patient's preferred pharmacy:  [id number] - [place], [place] - [address] [facility] [place] [address] Phone: [phone] Fax: [phone]  Is this the correct pharmacy for this prescription? Yes If no, delete pharmacy and type the correct one.   Has the prescription been filled recently? No  Is the patient out of the medication? No  Has the patient been seen for an appointment in the last year OR does the patient have an upcoming appointment? Yes  Can we respond through MyChart? Yes  Agent: Please be advised that Rx refills may take up to 3 business days. We ask that you follow-up with your pharmacy.

## 2024-04-25 ENCOUNTER — Other Ambulatory Visit: Payer: Self-pay | Admitting: Family Medicine

## 2024-04-25 MED ORDER — ATORVASTATIN CALCIUM 10 MG PO TABS: 10.0000 mg | ORAL_TABLET | Freq: Every day | ORAL | 1 refills | Status: DC

## 2024-04-25 MED ORDER — ELIQUIS 5 MG PO TABS: 5.0000 mg | ORAL_TABLET | Freq: Two times a day (BID) | ORAL | 1 refills | Status: DC

## 2024-04-28 ENCOUNTER — Other Ambulatory Visit: Payer: Self-pay | Admitting: Family Medicine

## 2024-04-28 MED ORDER — APIXABAN 2.5 MG PO TABS: 2.5000 mg | ORAL_TABLET | Freq: Two times a day (BID) | ORAL | 1 refills | Status: DC

## 2024-04-28 NOTE — Telephone Encounter (Signed)
  Pharmacy comment: Script Clarification:HUSBAND STATES SHOULD BE 2.5MG  DUE TO BLEEDING INTERNALLY EPISODE.

## 2024-05-14 ENCOUNTER — Ambulatory Visit: Payer: Self-pay

## 2024-05-14 NOTE — Telephone Encounter (Signed)
  FYI Only or Action Required?: FYI only for provider  Patient was last seen in primary care on 03/25/2024 by Crecencio Dodge, Candida Chalk, DO. Called Nurse Triage reporting Back Pain. Symptoms began several weeks ago. Interventions attempted: OTC medications: Tylenol. Symptoms are: gradually worsening.  Triage Disposition: See PCP When Office is Open (Within 3 Days)  Patient/caregiver understands and will follow disposition?: Yes   **Appointment scheduled for 6/20 with PCP           Copied from CRM [id number]. Topic: Clinical - Red Word Triage >> May 14, 2024  2:16 PM Albertha Alosa wrote: [facility] that prompted transfer to Nurse Triage: Patient called in stating she is having backpain    ----------------------------------------------------------------------- From previous Reason for Contact - Scheduling: Patient/patient representative is calling to schedule an appointment. Refer to attachments for appointment information. Reason for Disposition  [1] MODERATE back pain (e.g., interferes with normal activities) AND [2] present > 3 days  Answer Assessment - Initial Assessment Questions 1. ONSET: When did the pain begin?      Pt. Has chronic back pain, she reports the pain is worsening over the past 3 weeks.  2. LOCATION: Where does it hurt? (upper, mid or lower back)     Mid back to tailbone area 3. SEVERITY: How bad is the pain?  (e.g., Scale 1-10; mild, moderate, or severe)   - MILD (1-3): Doesn't interfere with normal activities.    - MODERATE (4-7): Interferes with normal activities or awakens from sleep.    - SEVERE (8-10): Excruciating pain, unable to do any normal activities.      5/10  4. PATTERN: Is the pain constant? (e.g., yes, no; constant, intermittent)      Constant  5. RADIATION: Does the pain shoot into your legs or somewhere else?     No  6. CAUSE:  What do you think is causing the back pain?      Unknown  7. BACK OVERUSE:  Any recent lifting of heavy  objects, strenuous work or exercise?     Pt. Is exercising 8. MEDICINES: What have you taken so far for the pain? (e.g., nothing, acetaminophen, NSAIDS)     Tylenol 9. NEUROLOGIC SYMPTOMS: Do you have any weakness, numbness, or problems with bowel/bladder control?     No  10. OTHER SYMPTOMS: Do you have any other symptoms? (e.g., fever, abdomen pain, burning with urination, blood in urine)       No    She has taken Tylenol with little relief.  Protocols used: Back Pain-A-AH

## 2024-05-16 ENCOUNTER — Ambulatory Visit (HOSPITAL_BASED_OUTPATIENT_CLINIC_OR_DEPARTMENT_OTHER)
Admission: RE | Admit: 2024-05-16 | Discharge: 2024-05-16 | Disposition: A | Discharge status: Home / Self Care | Source: Ambulatory Visit | Attending: Family Medicine | Admitting: Family Medicine

## 2024-05-16 ENCOUNTER — Encounter: Payer: Self-pay | Admitting: Family Medicine

## 2024-05-16 ENCOUNTER — Ambulatory Visit (INDEPENDENT_AMBULATORY_CARE_PROVIDER_SITE_OTHER): Admitting: Family Medicine

## 2024-05-16 VITALS — BP 120/78 | HR 72 | Temp 97.9°F | Resp 18 | Ht <= 58 in | Wt 147.4 lb

## 2024-05-16 DIAGNOSIS — M5441 Lumbago with sciatica, right side: Secondary | ICD-10-CM | POA: Insufficient documentation

## 2024-05-16 DIAGNOSIS — M549 Dorsalgia, unspecified: Secondary | ICD-10-CM | POA: Insufficient documentation

## 2024-05-16 DIAGNOSIS — R102 Pelvic and perineal pain: Secondary | ICD-10-CM | POA: Diagnosis not present

## 2024-05-16 MED ORDER — TIZANIDINE HCL 4 MG PO TABS: 4.0000 mg | ORAL_TABLET | Freq: Four times a day (QID) | ORAL | 0 refills | Status: DC | PRN

## 2024-05-16 MED ORDER — TRAMADOL HCL 50 MG PO TABS: 50.0000 mg | ORAL_TABLET | Freq: Three times a day (TID) | ORAL | 0 refills | Status: AC | PRN

## 2024-05-16 NOTE — Patient Instructions (Signed)

## 2024-05-16 NOTE — Assessment & Plan Note (Signed)
 Check xray

## 2024-05-16 NOTE — Progress Notes (Signed)
 Established Patient Office Visit  Subjective   Patient ID: Cindy Dudley, female    DOB: [birth year]  Age: 73 y.o. MRN: [id number]  Chief Complaint  Patient presents with   Back Pain    Pt states having lower to tail bone pain, x2 months. No falls or injuries.     HPI Discussed the use of AI scribe software for clinical note transcription with the patient, who gave verbal consent to proceed.  History of Present Illness Cindy Dudley is a 73 year old female who presents with sharp lower back pain.  She experiences sharp pain across her lower back, which began this morning. The pain is severe and worsens with movement, particularly when standing up and walking. Bending forward and backward does not exacerbate the pain. She has a history of chronic back pain in the middle of her back and tailbone, but the current sharp pain is a new symptom. No recent heavy lifting or falls. She has experienced back spasms recently, which were severe enough to 'practically take my breath away,' but these have subsided.  A recent vaginal exam was painful, and she was informed of pelvic floor myalgia, possibly related to nerve damage or surgery. No specific medication has been prescribed for this pain, but she finds it difficult to relax due to constant pain when sitting.  She has a history of hernia, known since January. A surgeon mentioned the possibility of multiple hernias, including one under her ostomy. She experiences tenderness in her stomach and has an upcoming appointment with a GI specialist on July 7th to further investigate these symptoms. She is concerned about undergoing hernia surgery due to its proximity to her ostomy and previous complications.  She is currently taking Tylenol Arthritis for pain, which provides minimal relief. She is also on Eliquis  and is cautious about taking supplements, particularly those containing vitamin K, due to potential interactions. She is considering menopause support  supplements and has an upcoming appointment with a gynecologist at the end of the month.  No urinary symptoms such as burning, but she always experiences frequency. She mentions having undergone blood tests at a cancer center, with one test showing normal results, but she did not bring the details to the appointment.   Patient Active Problem List   Diagnosis Date Noted   Mid back pain 05/16/2024   Pelvic pain 05/16/2024   Ileostomy stenosis (HCC) 09/28/2023   Irritant contact dermatitis associated with fecal stoma 08/26/2023   Ileostomy care (HCC) 08/26/2023   Titubation 08/07/2022   Bilateral hearing loss 08/07/2022   Benign meningioma of brain (HCC) 09/18/2018   Hereditary essential tremor 09/18/2018   Thyroid  nodule, uninodular, left 10/17/2011   Past Medical History:  Diagnosis Date   Anemia    Asthma    Blood transfusion    Brain tumor (HCC) 1995   Bruises easily    Bursitis    left knee   Colitis, ulcerative (HCC) 1969   Fatigue    Fibroid tumor 1982   Hearing loss    Kidney stone    Ovarian cyst 1981   Rash    Sore throat    Spasmodic dysphonia 1982   Spasmodic torticollis 2007   Ulcer    Wears glasses    Past Surgical History:  Procedure Laterality Date   ABDOMINAL HYSTERECTOMY  1982   BRAIN TUMOR EXCISION  1995   CHOLECYSTECTOMY  2016   ILEOSTOMY  1982   LARYNX SURGERY  2000   OVARIAN CYST REMOVAL  Social History   Tobacco Use   Smoking status: Never   Smokeless tobacco: Never  Substance Use Topics   Alcohol  use: No   Drug use: No   Social History   Socioeconomic History   Marital status: Married    Spouse name: Not on file   Number of children: Not on file   Years of education: Not on file   Highest education level: Associate degree: occupational, Scientist, product/process development, or vocational program  Occupational History   Not on file  Tobacco Use   Smoking status: Never   Smokeless tobacco: Never  Substance and Sexual Activity   Alcohol  use: No    Drug use: No   Sexual activity: Not on file  Other Topics Concern   Not on file  Social History Narrative   Lives home with husband.  Works for [employer].  Education college (6 months) for CMA.   Children 3.     Social Drivers of Corporate investment banker Strain: Low Risk  (05/16/2024)   Overall Financial Resource Strain (CARDIA)    Difficulty of Paying Living Expenses: Not very hard  Food Insecurity: Unknown (05/16/2024)   Hunger Vital Sign    Worried About Running Out of Food in the Last Year: Never true    Ran Out of Food in the Last Year: Not on file  Transportation Needs: No Transportation Needs (05/16/2024)   PRAPARE - Administrator, Civil Service (Medical): No    Lack of Transportation (Non-Medical): No  Physical Activity: Unknown (02/20/2024)   Exercise Vital Sign    Days of Exercise per Week: Patient declined    Minutes of Exercise per Session: Not on file  Stress: No Stress Concern Present (02/20/2024)   [employer] of Occupational Health - Occupational Stress Questionnaire    Feeling of Stress : Only a little  Social Connections: Unknown (05/16/2024)   Social Connection and Isolation Panel    Frequency of Communication with Friends and Family: Not on file    Frequency of Social Gatherings with Friends and Family: Not on file    Attends Religious Services: Not on Marketing executive or Organizations: Not on file    Attends Banker Meetings: Not on file    Marital Status: Married  Catering manager Violence: Not on file   Family Status  Relation Name Status   Mother  Deceased       breast cancer, dementia   Father  Deceased       heart disease   Sister 3 Alive       fibromyalgia, lime disease   Brother 1 Alive   Statistician  (Not Specified)       PD   PGM  (Not Specified)  No partnership data on file   Family History  Problem Relation Age of Onset   Cancer Mother        breast   Food Allergy  Mother    Allergic rhinitis Mother    Dementia Mother    Hypertension Mother    Glaucoma Mother    Heart disease Father    Gout Father    Fibromyalgia Sister    Hyperlipidemia Brother    Throat cancer Brother    Cancer Paternal Aunt        colon   Diabetes Paternal Grandmother    Allergies  Allergen Reactions   Latex Itching    Per patient  Sulfa Antibiotics     OTHER REACTION: BLOATING   Tape Rash      Review of Systems  Constitutional:  Negative for fever and malaise/fatigue.  HENT:  Negative for congestion.   Eyes:  Negative for blurred vision.  Respiratory:  Negative for cough and shortness of breath.   Cardiovascular:  Negative for chest pain, palpitations and leg swelling.  Gastrointestinal:  Negative for vomiting.  Musculoskeletal:  Positive for back pain and joint pain. Negative for falls.  Skin:  Negative for rash.  Neurological:  Negative for loss of consciousness and headaches.      Objective:     BP 120/78 (BP Location: Left Arm, Patient Position: Sitting, Cuff Size: Normal)   Pulse 72   Temp 97.9 F (36.6 C) (Oral)   Resp 18   Ht 4' 9 (1.448 m)   Wt 147 lb 6.4 oz (66.9 kg)   SpO2 98%   BMI 31.90 kg/m  BP Readings from Last 3 Encounters:  05/16/24 120/78  03/25/24 110/62  02/26/24 110/80   Wt Readings from Last 3 Encounters:  05/16/24 147 lb 6.4 oz (66.9 kg)  03/25/24 146 lb 9.6 oz (66.5 kg)  02/26/24 146 lb 3.2 oz (66.3 kg)   SpO2 Readings from Last 3 Encounters:  05/16/24 98%  03/25/24 96%  02/26/24 97%      Physical Exam Vitals and nursing note reviewed.  Constitutional:      General: She is not in acute distress.    Appearance: Normal appearance. She is well-developed.  HENT:     Head: Normocephalic and atraumatic.   Eyes:     General: No scleral icterus.       Right eye: No discharge.        Left eye: No discharge.    Cardiovascular:     Rate and Rhythm: Normal rate and regular rhythm.     Heart sounds: No murmur  heard. Pulmonary:     Effort: Pulmonary effort is normal. No respiratory distress.     Breath sounds: Normal breath sounds.   Musculoskeletal:        General: Tenderness present. Normal range of motion.     Cervical back: Normal range of motion and neck supple.     Right lower leg: No edema.     Left lower leg: No edema.   Skin:    General: Skin is warm and dry.   Neurological:     Mental Status: She is alert and oriented to person, place, and time.     Motor: No weakness.     Gait: Gait normal.   Psychiatric:        Mood and Affect: Mood normal.        Behavior: Behavior normal.        Thought Content: Thought content normal.        Judgment: Judgment normal.      No results found for any visits on 05/16/24.  Last CBC Lab Results  Component Value Date   WBC 6.1 03/25/2024   HGB 14.5 03/25/2024   HCT 43.1 03/25/2024   MCV 93.3 03/25/2024   MCH 29.2 06/11/2023   RDW 13.2 03/25/2024   PLT 305.0 03/25/2024   Last metabolic panel Lab Results  Component Value Date   GLUCOSE 101 (H) 03/25/2024   NA 140 03/25/2024   K 4.2 03/25/2024   CL 102 03/25/2024   CO2 32 03/25/2024   BUN 13 03/25/2024   CREATININE 0.75 03/25/2024   GFR  79.30 03/25/2024   CALCIUM  9.1 03/25/2024   PROT 6.1 03/25/2024   ALBUMIN 3.9 03/25/2024   LABGLOB 2.2 08/07/2022   AGRATIO 1.9 08/07/2022   BILITOT 0.5 03/25/2024   ALKPHOS 112 03/25/2024   AST 26 03/25/2024   ALT 27 03/25/2024   ANIONGAP 11 06/11/2023   Last lipids Lab Results  Component Value Date   CHOL 129 03/25/2024   HDL 58.40 03/25/2024   LDLCALC 3 03/25/2024   TRIG 338.0 (H) 03/25/2024   CHOLHDL 2 03/25/2024   Last hemoglobin A1c No results found for: HGBA1C Last thyroid  functions Lab Results  Component Value Date   TSH 1.90 03/25/2024   Last vitamin D No results found for: 25OHVITD2, 25OHVITD3, VD25OH Last vitamin B12 and Folate No results found for: VITAMINB12, FOLATE    The ASCVD Risk score  (Arnett DK, et al., 2019) failed to calculate for the following reasons:   The valid total cholesterol range is 130 to 320 mg/dL    Assessment & Plan:   Problem List Items Addressed This Visit       Unprioritized   Pelvic pain   Pt has seen onc/gyn Has app with gyn at the end of the month      Relevant Orders   Ambulatory referral to Physical Therapy   Mid back pain - Primary   Check xray       Relevant Medications   tiZANidine (ZANAFLEX) 4 MG tablet   traMADol (ULTRAM) 50 MG tablet   Other Relevant Orders   DG Thoracic Spine 2 View  Assessment and Plan Assessment & Plan Low Back Pain   She experiences acute sharp pain across the lower back, which began this morning, without radiation to the legs. Chronic pain persists in the middle back and tailbone, with recent severe spasms. Differential diagnosis includes muscle strain or compression fracture, with no recent trauma or heavy lifting. Order an x-ray of the back to assess for compression fracture. Prescribe tizanidine for muscle relaxation and tramadol for pain relief, advising nighttime use due to potential drowsiness. Recommend using heat and ice for pain management and advise against hiking or climbing hills during her upcoming vacation.  Pelvic Floor Myalgia   Diagnosed by her gynecologist, she experiences pain in the vaginal area, possibly related to nerve damage or previous surgery, with difficulty relaxing due to constant pain. Consider a referral to physical therapy for pelvic floor exercises and discuss with her gynecologist during the upcoming appointment.  Hernia   She has chronic pain associated with a hernia under the ostomy. A previous surgical consultation indicated potential for multiple hernias. She is hesitant about surgery due to the lack of guaranteed improvement and proximity to the ostomy. Follow up with a GI appointment on July 7 for further evaluation of abdominal tenderness and potential  hernias.  General Health Maintenance   She is concerned about vitamin supplements and their interaction with Eliquis , specifically vitamin A, E, and K. Advise that vitamin A and E are safe to take with Eliquis , but caution about vitamin K's potential to reverse the effects of blood thinners. Approve menopause support supplements containing rhubarb and vanadium.  Follow-up   She has upcoming appointments with specialists for further evaluation of ongoing issues. Complete x-ray and urinalysis today. Follow up with the gynecologist on June 30 and the GI specialist on July 7.    Return if symptoms worsen or fail to improve.    Chrissy Ealey R Lowne Chase, DO

## 2024-05-16 NOTE — Assessment & Plan Note (Signed)
 Pt has seen onc/gyn Has app with gyn at the end of the month

## 2024-05-21 ENCOUNTER — Telehealth: Payer: Self-pay

## 2024-05-21 NOTE — Telephone Encounter (Signed)
 Copied from CRM [id number]. Topic: Clinical - Lab/Test Results >> May 20, 2024  4:42 PM Maisie C wrote: Reason for CRM: patient called in to request if Dr. Antonio can check with the radiologist to see her imaging results from Friday, 6/20. She asked for a callback at [phone].  Please advise.

## 2024-05-22 NOTE — Telephone Encounter (Signed)
 Cindy Dudley   05/22/2024 10:01 AM  Patient is calling in regarding her results of her testing, patient would like to review these with the doctor.

## 2024-05-22 NOTE — Telephone Encounter (Signed)
 Awaiting results to be read by radiology

## 2024-05-23 ENCOUNTER — Encounter: Admitting: Obstetrics & Gynecology

## 2024-05-26 ENCOUNTER — Ambulatory Visit: Admitting: Obstetrics and Gynecology

## 2024-05-26 ENCOUNTER — Telehealth: Payer: Self-pay | Admitting: Family Medicine

## 2024-05-26 ENCOUNTER — Ambulatory Visit: Payer: Self-pay | Admitting: Family Medicine

## 2024-05-26 VITALS — BP 125/54 | HR 78 | Ht <= 58 in | Wt 146.1 lb

## 2024-05-26 DIAGNOSIS — R6882 Decreased libido: Secondary | ICD-10-CM | POA: Diagnosis not present

## 2024-05-26 NOTE — Telephone Encounter (Signed)
 Pt wants pcp to call and get results from x-ray back faster.

## 2024-05-26 NOTE — Progress Notes (Signed)
 NEW GYNECOLOGY VISIT Chief Complaint  Patient presents with   Sexual Problem    Subjective:  Cindy Dudley is a 73 y.o. who presents for low libido.  Reports history of ovarian cancer last year. Per chart review, stage 1a granulosa cell tumor. Treated with surgery, no chemo. She had previously had her other ovary removed. No ovaries now. Since then has had lots of medical issues and stressors. Tearful during today's encounter. Reports she has had an ostomy since 1982 and has had issues with it since the surgery. Also has a new hernia that gives her pain.  Reports low libido and hot flashes. Her gyn oncologist put her on prasterone but she did not find it helpful and noted that her libido got worse. She does not endorse any specific dryness or pain with intercourse but states that her gyn onc told her she had pelvic myalgia. She was not given any specific treatment for this but is discussing PT with her primary at this time. She uses lubricants with sexual activity, [place]. Finds this helpful but finds it hard to orgasm. She finds the hot flashes tolerable and is more concerned about her libido. She has not been on any new medications and has not been on any SSRIs.   Past Medical History:  Diagnosis Date   Anemia    Asthma    Blood transfusion    Brain tumor (HCC) 1995   Bruises easily    Bursitis    left knee   Colitis, ulcerative (HCC) 1969   Fatigue    Fibroid tumor 1982   Hearing loss    Kidney stone    Ovarian cyst 1981   Rash    Sore throat    Spasmodic dysphonia 1982   Spasmodic torticollis 2007   Ulcer    Wears glasses     Past Surgical History:  Procedure Laterality Date   ABDOMINAL HYSTERECTOMY  1982   BRAIN TUMOR EXCISION  1995   CHOLECYSTECTOMY  2016   ILEOSTOMY  1982   LARYNX SURGERY  2000   OVARIAN CYST REMOVAL      Social History   Socioeconomic History   Marital status: Married    Spouse name: Not on file   Number of children: Not on file   Years  of education: Not on file   Highest education level: Associate degree: occupational, Scientist, product/process development, or vocational program  Occupational History   Not on file  Tobacco Use   Smoking status: Never   Smokeless tobacco: Never  Substance and Sexual Activity   Alcohol  use: No   Drug use: No   Sexual activity: Not on file  Other Topics Concern   Not on file  Social History Narrative   Lives home with husband.  Works for [employer].  Education college (6 months) for CMA.   Children 3.     Social Drivers of Corporate investment banker Strain: Low Risk  (05/16/2024)   Overall Financial Resource Strain (CARDIA)    Difficulty of Paying Living Expenses: Not very hard  Food Insecurity: Unknown (05/16/2024)   Hunger Vital Sign    Worried About Running Out of Food in the Last Year: Never true    Ran Out of Food in the Last Year: Not on file  Transportation Needs: No Transportation Needs (05/16/2024)   PRAPARE - Administrator, Civil Service (Medical): No    Lack of Transportation (Non-Medical): No  Physical Activity: Unknown (02/20/2024)   Exercise Vital  Sign    Days of Exercise per Week: Patient declined    Minutes of Exercise per Session: Not on file  Stress: No Stress Concern Present (02/20/2024)   [employer] of Occupational Health - Occupational Stress Questionnaire    Feeling of Stress : Only a little  Social Connections: Unknown (05/16/2024)   Social Connection and Isolation Panel    Frequency of Communication with Friends and Family: Not on file    Frequency of Social Gatherings with Friends and Family: Not on file    Attends Religious Services: Not on file    Active Member of Clubs or Organizations: Not on file    Attends Banker Meetings: Not on file    Marital Status: Married    Family History  Problem Relation Age of Onset   Cancer Mother        breast   Food Allergy Mother    Allergic rhinitis Mother    Dementia Mother    Hypertension Mother     Glaucoma Mother    Heart disease Father    Gout Father    Fibromyalgia Sister    Hyperlipidemia Brother    Throat cancer Brother    Cancer Paternal Aunt        colon   Diabetes Paternal Grandmother     Current Outpatient Medications on File Prior to Visit  Medication Sig Dispense Refill   apixaban  (ELIQUIS ) 2.5 MG TABS tablet Take 1 tablet (2.5 mg total) by mouth 2 (two) times daily. 180 tablet 1   Ascorbic Acid (VITAMIN C) 1000 MG tablet Take 1,000 mg by mouth daily.     atorvastatin  (LIPITOR) 10 MG tablet Take 1 tablet (10 mg total) by mouth daily. 90 tablet 1   CALCIUM  CITRATE PO Take 1 tablet by mouth daily.     Cholecalciferol 50 MCG (2000 UT) TABS Take 2,000 Units by mouth daily.     latanoprost (XALATAN) 0.005 % ophthalmic solution      MAGNESIUM CITRATE PO Take 150 mg by mouth daily.     mirabegron ER (MYRBETRIQ) 50 MG TB24 tablet Take 1 tablet by mouth daily.     INTRAROSA 6.5 MG INST Place 6.5 mg vaginally. (Patient not taking: Reported on 05/26/2024)     tiZANidine  (ZANAFLEX ) 4 MG tablet Take 1 tablet (4 mg total) by mouth every 6 (six) hours as needed for muscle spasms. (Patient not taking: Reported on 05/26/2024) 30 tablet 0   UNABLE TO FIND Take 1 capsule by mouth daily. Med Name: RA Probitoic Digestive Care     UNABLE TO FIND Med Name: algae cal plus boost     No current facility-administered medications on file prior to visit.    Allergies  Allergen Reactions   Latex Itching    Per patient   Sulfa Antibiotics     OTHER REACTION: BLOATING   Tape Rash     Objective:   Vitals:   05/26/24 1048 05/26/24 1050  BP: (!) 129/59 (!) 125/54  Pulse: 74 78  Weight: 146 lb 1.9 oz (66.3 kg)   Height: 4' 10 (1.473 m)    Physical Examination:   General appearance - well appearing, and in no distress  Mental status - alert, oriented to person, place, and time  Psych:  tearful    Assessment and Plan:  1. Low libido (Primary) Reviewed patient medical history with  her as well as options for management. Reviewed that libido can be complex and multifactorial and that unfortunately there  is no quick/easy fix and options are limited in postmenopausal women. Given her significant medical history and stress this past year, I suspect that this may be the main cause. We reviewed lubricant use, change of routine, couples/sex therapy, therapy for processing her medical trauma, pelvic floor physical therapy.   I spent 28 minutes on this encounter including face to face time, chart review, counseling and documentation.  Rollo ONEIDA Bring, MD, FACOG Obstetrician & Gynecologist, [facility] for [facility], [facility]

## 2024-05-26 NOTE — Progress Notes (Signed)
 Pt presents for hot flashes and low libido. Pt was given Intrarosa 6.5, pt states that it made things worse so she quit taking it.

## 2024-05-26 NOTE — Telephone Encounter (Signed)
Results are back

## 2024-05-28 ENCOUNTER — Telehealth: Payer: Self-pay | Admitting: Family Medicine

## 2024-05-28 DIAGNOSIS — M5441 Lumbago with sciatica, right side: Secondary | ICD-10-CM

## 2024-05-28 NOTE — Telephone Encounter (Signed)
 Copied from CRM [id number]. Topic: Referral - Question >> May 28, 2024  9:26 AM Berneda FALCON wrote: Reason for CRM: Pt called in to state that the Physical [facility] referral does not take Medicare. Also wants to make sure the next place DOES take Medicare please. Would prefer the [place] area instead of [place] please.  Also would like a PT referral for her back, she is still having back pain. The prescriptions she gave her for her back, she had to stop taking after 3 days due to side effects.  They recommended [facility] on [address] in [place], [address]  Phone number is [phone]

## 2024-06-11 NOTE — Telephone Encounter (Signed)
 Order to add on for back pain placed today. Can take several days for our referral coordinator to send to [facility].

## 2024-06-11 NOTE — Telephone Encounter (Signed)
 Copied from CRM [id number]. Topic: Referral - Status >> Jun 11, 2024 12:06 PM Jasmin G wrote: Reason for CRM: Patient called because she states that she needs a referral for physical therapy, she has requested this referral before about 2 weeks ago but have not gotten it (Please refer to CRM on 7/02). They recommended [facility] on [address] in [place], [address] and another clinic at [facility] for her back issues. Please call patient back ASAP.

## 2024-06-11 NOTE — Telephone Encounter (Signed)
Referral placed for back pain.

## 2024-06-11 NOTE — Telephone Encounter (Signed)
 Can we update referral from 05/16/2024 please?

## 2024-06-11 NOTE — Addendum Note (Signed)
 Addended by: Quinto Tippy D on: 06/11/2024 01:22 PM   Modules accepted: Orders

## 2024-06-11 NOTE — Telephone Encounter (Signed)
 Copied from CRM [id number]. Topic: Referral - Status >> Jun 11, 2024  2:21 PM [place] wrote: Reason for CRM: Patient does not have any PT set up with her pelvic floor myalgia, They have not contacted her and she called [facility] today and they still do not have a referral for her.

## 2024-06-12 ENCOUNTER — Telehealth: Payer: Self-pay | Admitting: Family Medicine

## 2024-06-12 DIAGNOSIS — R102 Pelvic and perineal pain: Secondary | ICD-10-CM

## 2024-06-12 NOTE — Addendum Note (Signed)
 Addended by: Wynne Jury D on: 06/12/2024 01:46 PM   Modules accepted: Orders

## 2024-06-12 NOTE — Telephone Encounter (Signed)
 PT referral placed to [facility]

## 2024-06-12 NOTE — Telephone Encounter (Signed)
 Copied from CRM [id number]. Topic: Referral - Question >> Jun 12, 2024 12:57 PM Deleta RAMAN wrote: Reason for CRM: patient is calling because she had a prior referral on June 20th for pelvic however the office does not accept medicare. On July 2nd she contacted pcp and was able to  get another appointment in [place] [address] [phone]. The [place] location accepts medicare, but theres concerns with the location not doing the pelvic  and she would have to go to two locations for the referral. She would like a referral sent to [facility] in [place], [place] . You can give her a call at [phone] please leave a voice message if no answer.

## 2024-06-16 ENCOUNTER — Encounter: Payer: Self-pay | Admitting: Physical Therapy

## 2024-06-16 ENCOUNTER — Ambulatory Visit: Attending: Family Medicine | Admitting: Physical Therapy

## 2024-06-16 ENCOUNTER — Other Ambulatory Visit: Payer: Self-pay

## 2024-06-16 DIAGNOSIS — R279 Unspecified lack of coordination: Secondary | ICD-10-CM | POA: Insufficient documentation

## 2024-06-16 DIAGNOSIS — M6281 Muscle weakness (generalized): Secondary | ICD-10-CM | POA: Diagnosis present

## 2024-06-16 DIAGNOSIS — R293 Abnormal posture: Secondary | ICD-10-CM | POA: Diagnosis present

## 2024-06-16 DIAGNOSIS — R102 Pelvic and perineal pain: Secondary | ICD-10-CM | POA: Insufficient documentation

## 2024-06-16 DIAGNOSIS — M62838 Other muscle spasm: Secondary | ICD-10-CM | POA: Diagnosis present

## 2024-06-16 NOTE — Therapy (Signed)
 OUTPATIENT PHYSICAL THERAPY FEMALE PELVIC EVALUATION   Patient Name: Cindy Dudley MRN: [id number] DOB:02-01-1951, 73 y.o., female Today's Date: 06/16/2024  END OF SESSION:  PT End of Session - 06/16/24 1154     Visit Number 1    Date for PT Re-Evaluation 09/16/24    Authorization Type [employer]    Progress Note Due on Visit 10    PT Start Time 1146    PT Stop Time 1228    PT Time Calculation (min) 42 min    Activity Tolerance Patient tolerated treatment well;Patient limited by pain    Behavior During Therapy WFL for tasks assessed/performed          Past Medical History:  Diagnosis Date   Anemia    Asthma    Blood transfusion    Brain tumor (HCC) 1995   Bruises easily    Bursitis    left knee   Colitis, ulcerative (HCC) 1969   Fatigue    Fibroid tumor 1982   Hearing loss    Kidney stone    Ovarian cyst 1981   Rash    Sore throat    Spasmodic dysphonia 1982   Spasmodic torticollis 2007   Ulcer    Wears glasses    Past Surgical History:  Procedure Laterality Date   ABDOMINAL HYSTERECTOMY  1982   BRAIN TUMOR EXCISION  1995   CHOLECYSTECTOMY  2016   ILEOSTOMY  1982   LARYNX SURGERY  2000   OVARIAN CYST REMOVAL     Patient Active Problem List   Diagnosis Date Noted   Mid back pain 05/16/2024   Pelvic pain 05/16/2024   Ileostomy stenosis (HCC) 09/28/2023   Irritant contact dermatitis associated with fecal stoma 08/26/2023   Ileostomy care (HCC) 08/26/2023   Titubation 08/07/2022   Bilateral hearing loss 08/07/2022   Benign meningioma of brain (HCC) 09/18/2018   Hereditary essential tremor 09/18/2018   Thyroid  nodule, uninodular, left 10/17/2011    PCP: Antonio Cyndee Jamee JONELLE, DO   REFERRING PROVIDER: Antonio Cyndee Jamee JONELLE, DO   REFERRING DIAG: R10.2 (ICD-10-CM) - Pelvic pain  THERAPY DIAG:  Unspecified lack of coordination  Other muscle spasm  Abnormal posture  Rationale for Evaluation and Treatment: Rehabilitation  ONSET DATE: chronic    SUBJECTIVE:                                                                                                                                                                                           SUBJECTIVE STATEMENT: Rt side ostomy with hernia present, also lt side abdominal tenderness to touch feels black and blue. Also has bil groin  pain and tailbone pain. Also has mid and low back pain.  Changing ostomy once per day to every other day, after hysterectomy in May had increased to changing 5x daily which was not her normal but has improved.  32oz but limits to that to decreased urination  PAIN:  Are you having pain? Yes NPRS scale: 7-10/10 Pain location: bil pelvis, abdomen and back  Pain type: sharp Pain description: constant   Aggravating factors: sitting is constant, standing longer than 15 mins, housework, walking for 30 mins, changing positions Relieving factors: lying down flat  PRECAUTIONS: None  RED FLAGS: None   WEIGHT BEARING RESTRICTIONS: No  FALLS:  Has patient fallen in last 6 months? No  OCCUPATION: retired   ACTIVITY LEVEL : low  PLOF: Independent  PATIENT GOALS: to have no pain, wants to stand at least 1 hour without pain  PERTINENT HISTORY:  Coccyx pain,  pelvic floor myalgia, possibly related to nerve damage (per MD note) or surgery, multiple hernia, ostomy, ABDOMINAL HYSTERECTOMY,  Sexual abuse: No  BOWEL MOVEMENT: Uses ostomy since 80s and I  not concerns at this time  URINATION: Pain with urination: No Fully empty bladder: Yes:   Stream: Strong and Weak Urgency: No Frequency: every 30 mins if drinking water, limits due to this and can sometimes make it an hour - 1.5, 3x night Leakage: Urge to void sometimes  Pads: No  INTERCOURSE:  Ability to have vaginal penetration Yes  Pain with intercourse: none DrynessYes  Climax: unable and reports decreased sensation  Marinoff Scale: 0/3  PREGNANCY: Vaginal deliveries 3  C-section  deliveries 0 Currently pregnant No  PROLAPSE: None   OBJECTIVE:  Note: Objective measures were completed at Evaluation unless otherwise noted.  DIAGNOSTIC FINDINGS:    PATIENT SURVEYS:    PFIQ-7: 95  COGNITION: Overall cognitive status: Within functional limits for tasks assessed     SENSATION: Light touch: Appears intact  LUMBAR SPECIAL TESTS:  SI Compression/distraction test: both increased pain at abdomen however back pain improved with compression  FUNCTIONAL TESTS:  Functional squats - unable to complete in proper mechanics, bil knee valgus, trunk flexion and decreased descent by 75%  Sit up test - 0/3 GAIT: Decreased cadence, trunk flexion, Rt rib rounding, decreased step height bil, decreased stride length  POSTURE: rounded shoulders, forward head, increased thoracic kyphosis, posterior pelvic tilt, and flexed trunk    LUMBARAROM/PROM:  A/PROM A/PROM  eval  Flexion WFL but with pain  Extension Limited by 25%  Right lateral flexion Limited by 50%  Left lateral flexion Limited by 50%  Right rotation Limited by 50%  Left rotation Limited by 50%   (Blank rows = not tested)  LOWER EXTREMITY ROM: Bil hamstrings and adductors limited by 50%  LOWER EXTREMITY MMT:  Bil hips grossly 3+/5 PALPATION:   General: tightness at bil thoracic and lumbar paraspinals, TTP at Lt SIJ, Lt gluteal and bil piriformis   Pelvic Alignment: Rt hip hike  Abdominal: TTP at mid quadrant, and lt mid quadrant; scar present throughout midline of abdomen and poor mobility throughout                 External Perineal Exam: dryness and redness present; most redness present at introitus                             Internal Pelvic Floor: TTP throughout bil superficial and deep layers  Patient confirms identification and approves  PT to assess internal pelvic floor and treatment Yes No emotional/communication barriers or cognitive limitation. Patient is motivated to learn. Patient  understands and agrees with treatment goals and plan. PT explains patient will be examined in standing, sitting, and lying down to see how their muscles and joints work. When they are ready, they will be asked to remove their underwear so PT can examine their perineum. The patient is also given the option of providing their own chaperone as one is not provided in our facility. The patient also has the right and is explained the right to defer or refuse any part of the evaluation or treatment including the internal exam. With the patient's consent, PT will use one gloved finger to gently assess the muscles of the pelvic floor, seeing how well it contracts and relaxes and if there is muscle symmetry. After, the patient will get dressed and PT and patient will discuss exam findings and plan of care. PT and patient discuss plan of care, schedule, attendance policy and HEP activities.  PELVIC MMT:   MMT eval  Vaginal 1/5 - unable to complete consistently, often bearing down  Internal Anal Sphincter   External Anal Sphincter   Puborectalis   Diastasis Recti   (Blank rows = not tested)        TONE: Decreased   PROLAPSE: Possible anterior vaginal wall laxity present with coughing in hooklying   TODAY'S TREATMENT:                                                                                                                              DATE:   06/16/24 EVAL Examination completed, findings reviewed, pt educated on POC, HEP, and female pelvic floor anatomy, pelvic floor PT role in care, cues for pelvic floor contraction mechanics however pt only able to complete 2 reps with any activation without bearing down. Pt motivated to participate in PT and agreeable to attempt recommendations.     PATIENT EDUCATION:  Education details: Interior and spatial designer Person educated: Patient Education method: Programmer, multimedia, Facilities manager, Actor cues, Verbal cues, and Handouts Education comprehension: verbalized understanding,  returned demonstration, verbal cues required, tactile cues required, and needs further education  HOME EXERCISE PROGRAM: Access Code: [email or web address] URL: [email or web address] Date: 06/16/2024 Prepared by: Darryle   Exercises - Supine Diaphragmatic Breathing  - 1 x daily - 7 x weekly - 1 sets - 10 reps - Supine Lower Trunk Rotation  - 1 x daily - 7 x weekly - 1 sets - 10 reps - Supported Teacher, music with Pelvic Floor Relaxation  - 1 x daily - 7 x weekly - 1 sets - 3 reps - 30s holds - Seated Overhead Reach Stretch  - 1 x daily - 7 x weekly - 1 sets - 10 reps  ASSESSMENT:  CLINICAL IMPRESSION: Patient is a 73 y.o. female  who was seen today for physical therapy evaluation and treatment for pelvic, abdominal, back pain, urinary incontinence, and increased urinary frequency.  Pt reports she is very limited in her ability to complete house hold chores, walking, exercising and going out for social outings or errands due to her pain and pelvic floor symptoms. Pt has history of abdominal hysterectomy 2024, ostomy placed in 1980s and I with this but reports she has sensation of tension throughout abdomen. Pt reports urinary frequency is very bothersome as well and reports she limits fluid intake due to this as well. Pt found to have decreased flexibility in spine and hips bil, TTP and tightness in lumbar spine and Lt SIJ and as well as bil piriformis. Pt also has decreased core and hip strength and profound tension throughout abdomen in all quadrants, worse at Lt side and scar site. Patient consented to internal pelvic floor assessment vaginally this date and found to have decreased strength, endurance, and coordination. Patient benefited from verbal cues for improved technique with pelvic floor contractions and coordination of pelvic floor for proper mechanics however very inconsistent. Pt would benefit from additional PT to further address deficits.    OBJECTIVE IMPAIRMENTS: decreased  activity tolerance, decreased coordination, decreased endurance, decreased mobility, difficulty walking, decreased strength, increased fascial restrictions, increased muscle spasms, impaired flexibility, improper body mechanics, postural dysfunction, and pain.   ACTIVITY LIMITATIONS: carrying, lifting, sitting, standing, transfers, continence, and locomotion level  PARTICIPATION LIMITATIONS: meal prep, cleaning, laundry, interpersonal relationship, community activity, and occupation  PERSONAL FACTORS: Fitness, Time since onset of injury/illness/exacerbation, and 1 comorbidity: medical history are also affecting patient's functional outcome.   REHAB POTENTIAL: Good  CLINICAL DECISION MAKING: Stable/uncomplicated  EVALUATION COMPLEXITY: Low   GOALS: Goals reviewed with patient? Yes  SHORT TERM GOALS: Target date: 07/14/24  Pt to be I with HEP for carry over and continuing recommendations for improved outcomes.   Baseline: Goal status: INITIAL  2.  Pt to be I with pressure management techniques to decreased stress at pelvic floor for improved tolerance to standing and walking without pelvic symptoms.  Baseline:  Goal status: INITIAL  3.  Pt to be I with scar massage for improved abdominal tissue mobility to decreased urinary frequency to better tolerate traveling.  Baseline:  Goal status: INITIAL  4.  Pt will be independent with the knack, urge suppression technique, and double voiding in order to improve bladder habits and decrease urinary incontinence for improved skin integrity.  Baseline:  Goal status: INITIAL   LONG TERM GOALS: Target date: 09/16/24  Pt to be I with advanced HEP for carry over and continuing recommendations for improved outcomes.   Baseline:  Goal status: INITIAL  2.  Pt to demonstrate improved coordination of pelvic floor and breathing mechanics with 10# squat with appropriate synergistic patterns to decrease pain and leakage at least 75% of the time for  improved ability to complete a 30 minute walking without strain at pelvic floor and symptoms.    Baseline:  Goal status: INITIAL  3. Pt to demonstrate at least 4+/5 bil hip strength for improved pelvic stability and functional squats without leakage.  Baseline:  Goal status: INITIAL  4.  Pt to demonstrate improved core strength with ability to complete sit up test 2/3 without pain or pelvic symptoms.  Baseline:  Goal status: INITIAL  5.  Pt to report no more than one instance of urinary incontinence in a day for improved skin integrity and confidence to leave for errands.  Baseline:  Goal status: INITIAL  6.  Pt to report no more than 3/10 pain at hip/pelvic complex due to improved mobility  and strength of surrounding structures to tolerate at least 1 hour of standing and walking without worsening pain.  Baseline:  Goal status: INITIAL  PLAN:  PT FREQUENCY: 2x/week  PT DURATION: 16 sessions  PLANNED INTERVENTIONS: 97110-Therapeutic exercises, 97530- Therapeutic activity, 97112- Neuromuscular re-education, 97535- Self Care, [id number]- Manual therapy, [phone]- Canalith repositioning, [id number]- Aquatic Therapy, [phone]- Electrical stimulation (manual), [id number]- Vasopneumatic device, [id number]- Ultrasound, [id number]- Traction (mechanical), [id number]- Ionotophoresis 4mg /ml Dexamethasone, [id number] (1-2 muscles), 20561 (3+ muscles)- Dry Needling, Patient/Family education, Balance training, Taping, Joint mobilization, Spinal mobilization, Scar mobilization, DME instructions, Cryotherapy, Moist heat, and Biofeedback  PLAN FOR NEXT SESSION: manual at abdomen, low back and glutes, core and hip strength, stretching hips and spine, coordination breathing and transverse abdominis activation activation    Darryle Navy, PT, DPT 07/21/254:59 PM  [facility] [address] [place], [place] [address] Phone # [phone] Fax [phone]

## 2024-07-01 ENCOUNTER — Ambulatory Visit: Attending: Family Medicine | Admitting: Physical Therapy

## 2024-07-01 DIAGNOSIS — M6281 Muscle weakness (generalized): Secondary | ICD-10-CM | POA: Insufficient documentation

## 2024-07-01 DIAGNOSIS — R279 Unspecified lack of coordination: Secondary | ICD-10-CM | POA: Diagnosis present

## 2024-07-01 DIAGNOSIS — M62838 Other muscle spasm: Secondary | ICD-10-CM | POA: Diagnosis present

## 2024-07-01 DIAGNOSIS — R293 Abnormal posture: Secondary | ICD-10-CM | POA: Diagnosis present

## 2024-07-01 NOTE — Therapy (Signed)
 OUTPATIENT PHYSICAL THERAPY FEMALE PELVIC TREATMENT   Patient Name: Cindy Dudley MRN: [id number] DOB:01/07/1951, 73 y.o., female Today's Date: 07/01/2024  END OF SESSION:  PT End of Session - 07/01/24 1106     Visit Number 2    Date for PT Re-Evaluation 09/16/24    Authorization Type [employer] medicare    Progress Note Due on Visit 10    PT Start Time 1107   arrival   PT Stop Time 1145    PT Time Calculation (min) 38 min    Activity Tolerance Patient tolerated treatment well;Patient limited by pain    Behavior During Therapy WFL for tasks assessed/performed          Past Medical History:  Diagnosis Date   Anemia    Asthma    Blood transfusion    Brain tumor (HCC) 1995   Bruises easily    Bursitis    left knee   Colitis, ulcerative (HCC) 1969   Fatigue    Fibroid tumor 1982   Hearing loss    Kidney stone    Ovarian cyst 1981   Rash    Sore throat    Spasmodic dysphonia 1982   Spasmodic torticollis 2007   Ulcer    Wears glasses    Past Surgical History:  Procedure Laterality Date   ABDOMINAL HYSTERECTOMY  1982   BRAIN TUMOR EXCISION  1995   CHOLECYSTECTOMY  2016   ILEOSTOMY  1982   LARYNX SURGERY  2000   OVARIAN CYST REMOVAL     Patient Active Problem List   Diagnosis Date Noted   Mid back pain 05/16/2024   Pelvic pain 05/16/2024   Ileostomy stenosis (HCC) 09/28/2023   Irritant contact dermatitis associated with fecal stoma 08/26/2023   Ileostomy care (HCC) 08/26/2023   Titubation 08/07/2022   Bilateral hearing loss 08/07/2022   Benign meningioma of brain (HCC) 09/18/2018   Hereditary essential tremor 09/18/2018   Thyroid  nodule, uninodular, left 10/17/2011    PCP: Antonio Cyndee Jamee JONELLE, DO   REFERRING PROVIDER: Antonio Cyndee Jamee JONELLE, DO   REFERRING DIAG: R10.2 (ICD-10-CM) - Pelvic pain  THERAPY DIAG:  Unspecified lack of coordination  Other muscle spasm  Muscle weakness (generalized)  Abnormal posture  Rationale for Evaluation and Treatment:  Rehabilitation  ONSET DATE: chronic   SUBJECTIVE:                                                                                                                                                                                           SUBJECTIVE STATEMENT: Has started back PT now a couple weeks ago and hasn't seen a huge improvement  in pain yet reporting it comes and goes, tailbone pain at night is bothersome. And has intermittent Lt abdominal pain with back stretches.  Reports she has need to increase changes of ostomy output since starting more activity up to 3-4x daily and sometimes has leakage has been taking a medication for improvement of her output but now lowering down and improving slightly.   32oz but limits to that to decreased urination  PAIN:  Are you having pain? Yes NPRS scale: 3/10 Pain location: bil pelvis, abdomen and back  Pain type: sharp Pain description: constant   Aggravating factors: sitting is constant, standing longer than 15 mins, housework, walking for 30 mins, changing positions Relieving factors: lying down flat  PRECAUTIONS: None  RED FLAGS: None   WEIGHT BEARING RESTRICTIONS: No  FALLS:  Has patient fallen in last 6 months? No  OCCUPATION: retired   ACTIVITY LEVEL : low  PLOF: Independent  PATIENT GOALS: to have no pain, wants to stand at least 1 hour without pain  PERTINENT HISTORY:  Coccyx pain,  pelvic floor myalgia, possibly related to nerve damage (per MD note) or surgery, multiple hernia, ostomy, ABDOMINAL HYSTERECTOMY,  Sexual abuse: No  BOWEL MOVEMENT: Uses ostomy since 63s and I  not concerns at this time  URINATION: Pain with urination: No Fully empty bladder: Yes:   Stream: Strong and Weak Urgency: No Frequency: every 30 mins if drinking water, limits due to this and can sometimes make it an hour - 1.5, 3x night Leakage: Urge to void sometimes  Pads: No  INTERCOURSE:  Ability to have vaginal penetration Yes  Pain  with intercourse: none DrynessYes  Climax: unable and reports decreased sensation  Marinoff Scale: 0/3  PREGNANCY: Vaginal deliveries 3  C-section deliveries 0 Currently pregnant No  PROLAPSE: None   OBJECTIVE:  Note: Objective measures were completed at Evaluation unless otherwise noted.  DIAGNOSTIC FINDINGS:    PATIENT SURVEYS:   PFIQ-7: 95  COGNITION: Overall cognitive status: Within functional limits for tasks assessed     SENSATION: Light touch: Appears intact  LUMBAR SPECIAL TESTS:  SI Compression/distraction test: both increased pain at abdomen however back pain improved with compression  FUNCTIONAL TESTS:  Functional squats - unable to complete in proper mechanics, bil knee valgus, trunk flexion and decreased descent by 75%  Sit up test - 0/3 GAIT: Decreased cadence, trunk flexion, Rt rib rounding, decreased step height bil, decreased stride length  POSTURE: rounded shoulders, forward head, increased thoracic kyphosis, posterior pelvic tilt, and flexed trunk    LUMBARAROM/PROM:  A/PROM A/PROM  eval  Flexion WFL but with pain  Extension Limited by 25%  Right lateral flexion Limited by 50%  Left lateral flexion Limited by 50%  Right rotation Limited by 50%  Left rotation Limited by 50%   (Blank rows = not tested)  LOWER EXTREMITY ROM: Bil hamstrings and adductors limited by 50%  LOWER EXTREMITY MMT:  Bil hips grossly 3+/5 PALPATION:   General: tightness at bil thoracic and lumbar paraspinals, TTP at Lt SIJ, Lt gluteal and bil piriformis   Pelvic Alignment: Rt hip hike  Abdominal: TTP at mid quadrant, and lt mid quadrant; scar present throughout midline of abdomen and poor mobility throughout                 External Perineal Exam: dryness and redness present; most redness present at introitus  Internal Pelvic Floor: TTP throughout bil superficial and deep layers  Patient confirms identification and approves PT to  assess internal pelvic floor and treatment Yes No emotional/communication barriers or cognitive limitation. Patient is motivated to learn. Patient understands and agrees with treatment goals and plan. PT explains patient will be examined in standing, sitting, and lying down to see how their muscles and joints work. When they are ready, they will be asked to remove their underwear so PT can examine their perineum. The patient is also given the option of providing their own chaperone as one is not provided in our facility. The patient also has the right and is explained the right to defer or refuse any part of the evaluation or treatment including the internal exam. With the patient's consent, PT will use one gloved finger to gently assess the muscles of the pelvic floor, seeing how well it contracts and relaxes and if there is muscle symmetry. After, the patient will get dressed and PT and patient will discuss exam findings and plan of care. PT and patient discuss plan of care, schedule, attendance policy and HEP activities.  PELVIC MMT:   MMT eval  Vaginal 1/5 - unable to complete consistently, often bearing down  Internal Anal Sphincter   External Anal Sphincter   Puborectalis   Diastasis Recti   (Blank rows = not tested)        TONE: Decreased   PROLAPSE: Possible anterior vaginal wall laxity present with coughing in hooklying   TODAY'S TREATMENT:                                                                                                                              DATE:   06/16/24 EVAL Examination completed, findings reviewed, pt educated on POC, HEP, and female pelvic floor anatomy, pelvic floor PT role in care, cues for pelvic floor contraction mechanics however pt only able to complete 2 reps with any activation without bearing down. Pt motivated to participate in PT and agreeable to attempt recommendations.    07/01/24: Hooklying bil hip abduction green band 2x10 with  exhale Hooklying alt marching green band 2x10 with exhale Sidelying clams green band 2x10 each  2x10 bridges 2x10 sit to stands with cues for posture and pelvic floor activation  Pt educated on upright midline posture cues as she does sacral sit and round forward and reports with improved posture her tailbone was much less.   PATIENT EDUCATION:  Education details: Interior and spatial designer Person educated: Patient Education method: Programmer, multimedia, Facilities manager, Actor cues, Verbal cues, and Handouts Education comprehension: verbalized understanding, returned demonstration, verbal cues required, tactile cues required, and needs further education  HOME EXERCISE PROGRAM: Access Code: [email or web address] URL: [email or web address] Date: 06/16/2024 Prepared by: Darryle   Exercises - Supine Diaphragmatic Breathing  - 1 x daily - 7 x weekly - 1 sets - 10 reps - Supine Lower Trunk Rotation  - 1 x daily - 7 x weekly - 1 sets -  10 reps - Supported Teacher, music with Pelvic Floor Relaxation  - 1 x daily - 7 x weekly - 1 sets - 3 reps - 30s holds - Seated Overhead Reach Stretch  - 1 x daily - 7 x weekly - 1 sets - 10 reps  ASSESSMENT:  CLINICAL IMPRESSION: Patient is a 72 y.o. female  who was seen today for physical therapy treatment for pelvic, abdominal, back pain, urinary incontinence, and increased urinary frequency. Pt tolerated session well today with initiating glute and hip strengthening with banded resistance for decreased pain and improved pelvic stability. Patient benefited from verbal cues for improved technique with pelvic floor contractions and coordination of pelvic floor for proper mechanics however very inconsistent. Pt would benefit from additional PT to further address deficits.    OBJECTIVE IMPAIRMENTS: decreased activity tolerance, decreased coordination, decreased endurance, decreased mobility, difficulty walking, decreased strength, increased fascial restrictions, increased muscle spasms,  impaired flexibility, improper body mechanics, postural dysfunction, and pain.   ACTIVITY LIMITATIONS: carrying, lifting, sitting, standing, transfers, continence, and locomotion level  PARTICIPATION LIMITATIONS: meal prep, cleaning, laundry, interpersonal relationship, community activity, and occupation  PERSONAL FACTORS: Fitness, Time since onset of injury/illness/exacerbation, and 1 comorbidity: medical history are also affecting patient's functional outcome.   REHAB POTENTIAL: Good  CLINICAL DECISION MAKING: Stable/uncomplicated  EVALUATION COMPLEXITY: Low   GOALS: Goals reviewed with patient? Yes  SHORT TERM GOALS: Target date: 07/14/24  Pt to be I with HEP for carry over and continuing recommendations for improved outcomes.   Baseline: Goal status: INITIAL  2.  Pt to be I with pressure management techniques to decreased stress at pelvic floor for improved tolerance to standing and walking without pelvic symptoms.  Baseline:  Goal status: INITIAL  3.  Pt to be I with scar massage for improved abdominal tissue mobility to decreased urinary frequency to better tolerate traveling.  Baseline:  Goal status: INITIAL  4.  Pt will be independent with the knack, urge suppression technique, and double voiding in order to improve bladder habits and decrease urinary incontinence for improved skin integrity.  Baseline:  Goal status: INITIAL   LONG TERM GOALS: Target date: 09/16/24  Pt to be I with advanced HEP for carry over and continuing recommendations for improved outcomes.   Baseline:  Goal status: INITIAL  2.  Pt to demonstrate improved coordination of pelvic floor and breathing mechanics with 10# squat with appropriate synergistic patterns to decrease pain and leakage at least 75% of the time for improved ability to complete a 30 minute walking without strain at pelvic floor and symptoms.    Baseline:  Goal status: INITIAL  3. Pt to demonstrate at least 4+/5 bil hip  strength for improved pelvic stability and functional squats without leakage.  Baseline:  Goal status: INITIAL  4.  Pt to demonstrate improved core strength with ability to complete sit up test 2/3 without pain or pelvic symptoms.  Baseline:  Goal status: INITIAL  5.  Pt to report no more than one instance of urinary incontinence in a day for improved skin integrity and confidence to leave for errands.  Baseline:  Goal status: INITIAL  6.  Pt to report no more than 3/10 pain at hip/pelvic complex due to improved mobility and strength of surrounding structures to tolerate at least 1 hour of standing and walking without worsening pain.  Baseline:  Goal status: INITIAL  PLAN:  PT FREQUENCY: 2x/week  PT DURATION: 16 sessions  PLANNED INTERVENTIONS: 97110-Therapeutic exercises, 97530- Therapeutic activity, 97112-  Neuromuscular re-education, [phone]- Self Care, 02859- Manual therapy, [phone]- Canalith repositioning, [id number]- Aquatic Therapy, [phone]- Electrical stimulation (manual), [id number]- Vasopneumatic device, [id number]- Ultrasound, [id number]- Traction (mechanical), [id number]- Ionotophoresis 4mg /ml Dexamethasone, 20560 (1-2 muscles), 20561 (3+ muscles)- Dry Needling, Patient/Family education, Balance training, Taping, Joint mobilization, Spinal mobilization, Scar mobilization, DME instructions, Cryotherapy, Moist heat, and Biofeedback  PLAN FOR NEXT SESSION: manual at abdomen, low back and glutes, core and hip strength, stretching hips and spine, coordination breathing and transverse abdominis activation activation    Darryle Navy, PT, DPT 07/02/2511:02 PM  [facility] [address] [place], [place] [address] Phone # [phone] Fax [phone]

## 2024-07-08 ENCOUNTER — Ambulatory Visit: Admitting: Physical Therapy

## 2024-07-08 DIAGNOSIS — M6281 Muscle weakness (generalized): Secondary | ICD-10-CM

## 2024-07-08 DIAGNOSIS — R293 Abnormal posture: Secondary | ICD-10-CM

## 2024-07-08 DIAGNOSIS — R279 Unspecified lack of coordination: Secondary | ICD-10-CM | POA: Diagnosis not present

## 2024-07-08 NOTE — Therapy (Signed)
 OUTPATIENT PHYSICAL THERAPY FEMALE PELVIC TREATMENT   Patient Name: Cindy Dudley MRN: [id number] DOB:09/14/1951, 73 y.o., female Today's Date: 07/08/2024  END OF SESSION:  PT End of Session - 07/01/24 1106     Visit Number 2    Date for PT Re-Evaluation 09/16/24    Authorization Type [employer] medicare    Progress Note Due on Visit 10    PT Start Time 1107   arrival   PT Stop Time 1145    PT Time Calculation (min) 38 min    Activity Tolerance Patient tolerated treatment well;Patient limited by pain    Behavior During Therapy WFL for tasks assessed/performed          Past Medical History:  Diagnosis Date   Anemia    Asthma    Blood transfusion    Brain tumor (HCC) 1995   Bruises easily    Bursitis    left knee   Colitis, ulcerative (HCC) 1969   Fatigue    Fibroid tumor 1982   Hearing loss    Kidney stone    Ovarian cyst 1981   Rash    Sore throat    Spasmodic dysphonia 1982   Spasmodic torticollis 2007   Ulcer    Wears glasses    Past Surgical History:  Procedure Laterality Date   ABDOMINAL HYSTERECTOMY  1982   BRAIN TUMOR EXCISION  1995   CHOLECYSTECTOMY  2016   ILEOSTOMY  1982   LARYNX SURGERY  2000   OVARIAN CYST REMOVAL     Patient Active Problem List   Diagnosis Date Noted   Mid back pain 05/16/2024   Pelvic pain 05/16/2024   Ileostomy stenosis (HCC) 09/28/2023   Irritant contact dermatitis associated with fecal stoma 08/26/2023   Ileostomy care (HCC) 08/26/2023   Titubation 08/07/2022   Bilateral hearing loss 08/07/2022   Benign meningioma of brain (HCC) 09/18/2018   Hereditary essential tremor 09/18/2018   Thyroid  nodule, uninodular, left 10/17/2011    PCP: Antonio Cyndee Jamee JONELLE, DO   REFERRING PROVIDER: Antonio Cyndee Jamee JONELLE, DO   REFERRING DIAG: R10.2 (ICD-10-CM) - Pelvic pain  THERAPY DIAG:  Unspecified lack of coordination  Muscle weakness (generalized)  Abnormal posture  Rationale for Evaluation and Treatment:  Rehabilitation  ONSET DATE: chronic   SUBJECTIVE:                                                                                                                                                                                           SUBJECTIVE STATEMENT: Reports her back and abdominal pain is getting better, does still occur intermittently but not constant. Pt reports she  does still have Lt abdominal tenderness but unsure if this is just with more activity. Does report she is still having ostomy outputs much more frequency than her normal prior to therapy and stressed about not having coverage for enough.   32oz but limits to that to decreased urination  PAIN:  Are you having pain? Yes NPRS scale: 1/10 Pain location: bil pelvis, abdomen and back  Pain type: sharp Pain description: intermittent   Aggravating factors: sitting is constant, standing longer than 15 mins, housework, walking for 30 mins, changing positions Relieving factors: lying down flat  PRECAUTIONS: None  RED FLAGS: None   WEIGHT BEARING RESTRICTIONS: No  FALLS:  Has patient fallen in last 6 months? No  OCCUPATION: retired   ACTIVITY LEVEL : low  PLOF: Independent  PATIENT GOALS: to have no pain, wants to stand at least 1 hour without pain  PERTINENT HISTORY:  Coccyx pain,  pelvic floor myalgia, possibly related to nerve damage (per MD note) or surgery, multiple hernia, ostomy, ABDOMINAL HYSTERECTOMY,  Sexual abuse: No  BOWEL MOVEMENT: Uses ostomy since 75s and I  not concerns at this time  URINATION: Pain with urination: No Fully empty bladder: Yes:   Stream: Strong and Weak Urgency: No Frequency: every 30 mins if drinking water, limits due to this and can sometimes make it an hour - 1.5, 3x night Leakage: Urge to void sometimes  Pads: No  INTERCOURSE:  Ability to have vaginal penetration Yes  Pain with intercourse: none DrynessYes  Climax: unable and reports decreased sensation  Marinoff  Scale: 0/3  PREGNANCY: Vaginal deliveries 3  C-section deliveries 0 Currently pregnant No  PROLAPSE: None   OBJECTIVE:  Note: Objective measures were completed at Evaluation unless otherwise noted.  DIAGNOSTIC FINDINGS:    PATIENT SURVEYS:   PFIQ-7: 95  COGNITION: Overall cognitive status: Within functional limits for tasks assessed     SENSATION: Light touch: Appears intact  LUMBAR SPECIAL TESTS:  SI Compression/distraction test: both increased pain at abdomen however back pain improved with compression  FUNCTIONAL TESTS:  Functional squats - unable to complete in proper mechanics, bil knee valgus, trunk flexion and decreased descent by 75%  Sit up test - 0/3 GAIT: Decreased cadence, trunk flexion, Rt rib rounding, decreased step height bil, decreased stride length  POSTURE: rounded shoulders, forward head, increased thoracic kyphosis, posterior pelvic tilt, and flexed trunk    LUMBARAROM/PROM:  A/PROM A/PROM  eval  Flexion WFL but with pain  Extension Limited by 25%  Right lateral flexion Limited by 50%  Left lateral flexion Limited by 50%  Right rotation Limited by 50%  Left rotation Limited by 50%   (Blank rows = not tested)  LOWER EXTREMITY ROM: Bil hamstrings and adductors limited by 50%  LOWER EXTREMITY MMT:  Bil hips grossly 3+/5 PALPATION:   General: tightness at bil thoracic and lumbar paraspinals, TTP at Lt SIJ, Lt gluteal and bil piriformis   Pelvic Alignment: Rt hip hike  Abdominal: TTP at mid quadrant, and lt mid quadrant; scar present throughout midline of abdomen and poor mobility throughout                 External Perineal Exam: dryness and redness present; most redness present at introitus                             Internal Pelvic Floor: TTP throughout bil superficial and deep layers  Patient confirms identification and  approves PT to assess internal pelvic floor and treatment Yes No emotional/communication barriers or  cognitive limitation. Patient is motivated to learn. Patient understands and agrees with treatment goals and plan. PT explains patient will be examined in standing, sitting, and lying down to see how their muscles and joints work. When they are ready, they will be asked to remove their underwear so PT can examine their perineum. The patient is also given the option of providing their own chaperone as one is not provided in our facility. The patient also has the right and is explained the right to defer or refuse any part of the evaluation or treatment including the internal exam. With the patient's consent, PT will use one gloved finger to gently assess the muscles of the pelvic floor, seeing how well it contracts and relaxes and if there is muscle symmetry. After, the patient will get dressed and PT and patient will discuss exam findings and plan of care. PT and patient discuss plan of care, schedule, attendance policy and HEP activities.  PELVIC MMT:   MMT eval  Vaginal 1/5 - unable to complete consistently, often bearing down  Internal Anal Sphincter   External Anal Sphincter   Puborectalis   Diastasis Recti   (Blank rows = not tested)        TONE: Decreased   PROLAPSE: Possible anterior vaginal wall laxity present with coughing in hooklying   TODAY'S TREATMENT:                                                                                                                              DATE:   06/16/24 EVAL Examination completed, findings reviewed, pt educated on POC, HEP, and female pelvic floor anatomy, pelvic floor PT role in care, cues for pelvic floor contraction mechanics however pt only able to complete 2 reps with any activation without bearing down. Pt motivated to participate in PT and agreeable to attempt recommendations.    07/01/24: Hooklying bil hip abduction green band 2x10 with exhale Hooklying alt marching green band 2x10 with exhale Sidelying clams green band 2x10 each   2x10 bridges 2x10 sit to stands with cues for posture and pelvic floor activation  Pt educated on upright midline posture cues as she does sacral sit and round forward and reports with improved posture her tailbone was much less.   07/08/24: Pt educated on bladder irritants and urge drill - handouts given Manual at abdomen for fascial release, scar tissue mobility pt in prone - indirect release technique completed with skilled PT for tissue mobility - pt very tender in several areas with increased tension on Lt abdomen and bil lower abdomen. Pt also educated on self massage here at home.   PATIENT EDUCATION:  Education details: Interior and spatial designer Person educated: Patient Education method: Programmer, multimedia, Facilities manager, Actor cues, Verbal cues, and Handouts Education comprehension: verbalized understanding, returned demonstration, verbal cues required, tactile cues required, and needs further education  HOME EXERCISE PROGRAM: Access  Code: [email or web address] URL: [email or web address] Date: 06/16/2024 Prepared by: Darryle   Exercises - Supine Diaphragmatic Breathing  - 1 x daily - 7 x weekly - 1 sets - 10 reps - Supine Lower Trunk Rotation  - 1 x daily - 7 x weekly - 1 sets - 10 reps - Supported Teacher, music with Pelvic Floor Relaxation  - 1 x daily - 7 x weekly - 1 sets - 3 reps - 30s holds - Seated Overhead Reach Stretch  - 1 x daily - 7 x weekly - 1 sets - 10 reps  ASSESSMENT:  CLINICAL IMPRESSION: Patient is a 73 y.o. female  who was seen today for physical therapy treatment for pelvic, abdominal, back pain, urinary incontinence, and increased urinary frequency. Pt tolerated session well today with abdominal massage and fascial release and education on bladder retraining. Pt had several very restricted areas in Lt and lower abdomen, does have large transverse scar at lower abdomen from hysterectomy with mid and LT scar site profoundly tight and tender to palpation. Improved with light  pressure and indirect release techniques. Pt educated on how to complete at home for improved tissue mobility. Pt would benefit from additional PT to further address deficits.    OBJECTIVE IMPAIRMENTS: decreased activity tolerance, decreased coordination, decreased endurance, decreased mobility, difficulty walking, decreased strength, increased fascial restrictions, increased muscle spasms, impaired flexibility, improper body mechanics, postural dysfunction, and pain.   ACTIVITY LIMITATIONS: carrying, lifting, sitting, standing, transfers, continence, and locomotion level  PARTICIPATION LIMITATIONS: meal prep, cleaning, laundry, interpersonal relationship, community activity, and occupation  PERSONAL FACTORS: Fitness, Time since onset of injury/illness/exacerbation, and 1 comorbidity: medical history are also affecting patient's functional outcome.   REHAB POTENTIAL: Good  CLINICAL DECISION MAKING: Stable/uncomplicated  EVALUATION COMPLEXITY: Low   GOALS: Goals reviewed with patient? Yes  SHORT TERM GOALS: Target date: 07/14/24  Pt to be I with HEP for carry over and continuing recommendations for improved outcomes.   Baseline: Goal status: INITIAL  2.  Pt to be I with pressure management techniques to decreased stress at pelvic floor for improved tolerance to standing and walking without pelvic symptoms.  Baseline:  Goal status: INITIAL  3.  Pt to be I with scar massage for improved abdominal tissue mobility to decreased urinary frequency to better tolerate traveling.  Baseline:  Goal status: INITIAL  4.  Pt will be independent with the knack, urge suppression technique, and double voiding in order to improve bladder habits and decrease urinary incontinence for improved skin integrity.  Baseline:  Goal status: INITIAL   LONG TERM GOALS: Target date: 09/16/24  Pt to be I with advanced HEP for carry over and continuing recommendations for improved outcomes.   Baseline:  Goal  status: INITIAL  2.  Pt to demonstrate improved coordination of pelvic floor and breathing mechanics with 10# squat with appropriate synergistic patterns to decrease pain and leakage at least 75% of the time for improved ability to complete a 30 minute walking without strain at pelvic floor and symptoms.    Baseline:  Goal status: INITIAL  3. Pt to demonstrate at least 4+/5 bil hip strength for improved pelvic stability and functional squats without leakage.  Baseline:  Goal status: INITIAL  4.  Pt to demonstrate improved core strength with ability to complete sit up test 2/3 without pain or pelvic symptoms.  Baseline:  Goal status: INITIAL  5.  Pt to report no more than one instance of urinary incontinence in a day for improved skin integrity  and confidence to leave for errands.  Baseline:  Goal status: INITIAL  6.  Pt to report no more than 3/10 pain at hip/pelvic complex due to improved mobility and strength of surrounding structures to tolerate at least 1 hour of standing and walking without worsening pain.  Baseline:  Goal status: INITIAL  PLAN:  PT FREQUENCY: 2x/week  PT DURATION: 16 sessions  PLANNED INTERVENTIONS: 97110-Therapeutic exercises, 97530- Therapeutic activity, 97112- Neuromuscular re-education, 97535- Self Care, 02859- Manual therapy, [id number]- Canalith repositioning, [id number]- Aquatic Therapy, [id number]- Electrical stimulation (manual), [id number]- Vasopneumatic device, [id number]- Ultrasound, [id number]- Traction (mechanical), [id number]- Ionotophoresis 4mg /ml Dexamethasone, 79439 (1-2 muscles), 20561 (3+ muscles)- Dry Needling, Patient/Family education, Balance training, Taping, Joint mobilization, Spinal mobilization, Scar mobilization, DME instructions, Cryotherapy, Moist heat, and Biofeedback  PLAN FOR NEXT SESSION: manual at abdomen, low back and glutes, core and hip strength, stretching hips and spine, coordination breathing and transverse abdominis activation activation    Darryle Navy, PT, DPT 07/08/2510:45 AM  [facility] [address] [place], [place] [address] Phone # [phone] Fax [phone]

## 2024-07-08 NOTE — Patient Instructions (Signed)

## 2024-07-17 ENCOUNTER — Ambulatory Visit: Admitting: Physical Therapy

## 2024-07-17 DIAGNOSIS — R293 Abnormal posture: Secondary | ICD-10-CM

## 2024-07-17 DIAGNOSIS — R279 Unspecified lack of coordination: Secondary | ICD-10-CM

## 2024-07-17 DIAGNOSIS — M6281 Muscle weakness (generalized): Secondary | ICD-10-CM

## 2024-07-17 NOTE — Therapy (Signed)
 OUTPATIENT PHYSICAL THERAPY FEMALE PELVIC TREATMENT   Patient Name: Cindy Dudley MRN: [id number] DOB:[birth year], 73 y.o., female Today's Date: 07/17/2024   PT End of Session - 07/17/24 1525     Visit Number 4    Date for PT Re-Evaluation 09/16/24    Authorization Type [employer] medicare    Authorization Time Period [employer] APPROVED: 06/16/2024 - 08/11/2024 16 Visit(s)    Authorization - Visit Number 4    Authorization - Number of Visits 16    Progress Note Due on Visit 10    PT Start Time 1451    PT Stop Time 1530    PT Time Calculation (min) 39 min    Activity Tolerance Patient tolerated treatment well;Patient limited by pain    Behavior During Therapy [facility] for tasks assessed/performed          PT End of Session - 07/17/24 1525     Visit Number 4    Date for PT Re-Evaluation 09/16/24    Authorization Type [employer] medicare    Authorization Time Period [employer] APPROVED: 06/16/2024 - 08/11/2024 16 Visit(s)    Authorization - Visit Number 4    Authorization - Number of Visits 16    Progress Note Due on Visit 10    PT Start Time 1451    PT Stop Time 1530    PT Time Calculation (min) 39 min    Activity Tolerance Patient tolerated treatment well;Patient limited by pain    Behavior During Therapy WFL for tasks assessed/performed           Past Medical History:  Diagnosis Date   Anemia    Asthma    Blood transfusion    Brain tumor (HCC) 1995   Bruises easily    Bursitis    left knee   Colitis, ulcerative (HCC) 1969   Fatigue    Fibroid tumor 1982   Hearing loss    Kidney stone    Ovarian cyst 1981   Rash    Sore throat    Spasmodic dysphonia 1982   Spasmodic torticollis 2007   Ulcer    Wears glasses    Past Surgical History:  Procedure Laterality Date   ABDOMINAL HYSTERECTOMY  1982   BRAIN TUMOR EXCISION  1995   CHOLECYSTECTOMY  2016   ILEOSTOMY  1982   LARYNX SURGERY  2000   OVARIAN CYST REMOVAL     Patient Active Problem List   Diagnosis Date Noted   Mid back  pain 05/16/2024   Pelvic pain 05/16/2024   Ileostomy stenosis (HCC) 09/28/2023   Irritant contact dermatitis associated with fecal stoma 08/26/2023   Ileostomy care (HCC) 08/26/2023   Titubation 08/07/2022   Bilateral hearing loss 08/07/2022   Benign meningioma of brain (HCC) 09/18/2018   Hereditary essential tremor 09/18/2018   Thyroid  nodule, uninodular, left 10/17/2011    PCP: Antonio Cyndee Jamee JONELLE, DO   REFERRING PROVIDER: Antonio Cyndee Jamee JONELLE, DO   REFERRING DIAG: R10.2 (ICD-10-CM) - Pelvic pain  THERAPY DIAG:  Unspecified lack of coordination  Muscle weakness (generalized)  Abnormal posture  Rationale for Evaluation and Treatment: Rehabilitation  ONSET DATE: chronic   SUBJECTIVE:  SUBJECTIVE STATEMENT: Reports her back and abdominal pain is getting better, does still occur intermittently but not constant. Pt reports she does still have Lt abdominal tenderness but unsure if this is just with more activity. Does report she is still having ostomy outputs much more frequency than her normal prior to therapy and stressed about not having coverage for enough.   32oz but limits to that to decreased urination  PAIN:  Are you having pain? Yes NPRS scale: 1/10 Pain location: bil pelvis, abdomen and back  Pain type: sharp Pain description: intermittent   Aggravating factors: sitting is constant, standing longer than 15 mins, housework, walking for 30 mins, changing positions Relieving factors: lying down flat  PRECAUTIONS: None  RED FLAGS: None   WEIGHT BEARING RESTRICTIONS: No  FALLS:  Has patient fallen in last 6 months? No  OCCUPATION: retired   ACTIVITY LEVEL : low  PLOF: Independent  PATIENT GOALS: to have no pain, wants to stand at least 1 hour without pain  PERTINENT  HISTORY:  Coccyx pain,  pelvic floor myalgia, possibly related to nerve damage (per MD note) or surgery, multiple hernia, ostomy, ABDOMINAL HYSTERECTOMY,  Sexual abuse: No  BOWEL MOVEMENT: Uses ostomy since 1s and I  not concerns at this time  URINATION: Pain with urination: No Fully empty bladder: Yes:   Stream: Strong and Weak Urgency: No Frequency: every 30 mins if drinking water, limits due to this and can sometimes make it an hour - 1.5, 3x night Leakage: Urge to void sometimes  Pads: No  INTERCOURSE:  Ability to have vaginal penetration Yes  Pain with intercourse: none DrynessYes  Climax: unable and reports decreased sensation  Marinoff Scale: 0/3  PREGNANCY: Vaginal deliveries 3  C-section deliveries 0 Currently pregnant No  PROLAPSE: None   OBJECTIVE:  Note: Objective measures were completed at Evaluation unless otherwise noted.  DIAGNOSTIC FINDINGS:    PATIENT SURVEYS:   PFIQ-7: 95  COGNITION: Overall cognitive status: Within functional limits for tasks assessed     SENSATION: Light touch: Appears intact  LUMBAR SPECIAL TESTS:  SI Compression/distraction test: both increased pain at abdomen however back pain improved with compression  FUNCTIONAL TESTS:  Functional squats - unable to complete in proper mechanics, bil knee valgus, trunk flexion and decreased descent by 75%  Sit up test - 0/3 GAIT: Decreased cadence, trunk flexion, Rt rib rounding, decreased step height bil, decreased stride length  POSTURE: rounded shoulders, forward head, increased thoracic kyphosis, posterior pelvic tilt, and flexed trunk    LUMBARAROM/PROM:  A/PROM A/PROM  eval  Flexion WFL but with pain  Extension Limited by 25%  Right lateral flexion Limited by 50%  Left lateral flexion Limited by 50%  Right rotation Limited by 50%  Left rotation Limited by 50%   (Blank rows = not tested)  LOWER EXTREMITY ROM: Bil hamstrings and adductors limited by 50%  LOWER  EXTREMITY MMT:  Bil hips grossly 3+/5 PALPATION:   General: tightness at bil thoracic and lumbar paraspinals, TTP at Lt SIJ, Lt gluteal and bil piriformis   Pelvic Alignment: Rt hip hike  Abdominal: TTP at mid quadrant, and lt mid quadrant; scar present throughout midline of abdomen and poor mobility throughout                 External Perineal Exam: dryness and redness present; most redness present at introitus  Internal Pelvic Floor: TTP throughout bil superficial and deep layers  Patient confirms identification and approves PT to assess internal pelvic floor and treatment Yes No emotional/communication barriers or cognitive limitation. Patient is motivated to learn. Patient understands and agrees with treatment goals and plan. PT explains patient will be examined in standing, sitting, and lying down to see how their muscles and joints work. When they are ready, they will be asked to remove their underwear so PT can examine their perineum. The patient is also given the option of providing their own chaperone as one is not provided in our facility. The patient also has the right and is explained the right to defer or refuse any part of the evaluation or treatment including the internal exam. With the patient's consent, PT will use one gloved finger to gently assess the muscles of the pelvic floor, seeing how well it contracts and relaxes and if there is muscle symmetry. After, the patient will get dressed and PT and patient will discuss exam findings and plan of care. PT and patient discuss plan of care, schedule, attendance policy and HEP activities.  PELVIC MMT:   MMT eval  Vaginal 1/5 - unable to complete consistently, often bearing down  Internal Anal Sphincter   External Anal Sphincter   Puborectalis   Diastasis Recti   (Blank rows = not tested)        TONE: Decreased   PROLAPSE: Possible anterior vaginal wall laxity present with coughing in hooklying    TODAY'S TREATMENT:                                                                                                                              DATE:   06/16/24 EVAL Examination completed, findings reviewed, pt educated on POC, HEP, and female pelvic floor anatomy, pelvic floor PT role in care, cues for pelvic floor contraction mechanics however pt only able to complete 2 reps with any activation without bearing down. Pt motivated to participate in PT and agreeable to attempt recommendations.    07/01/24: Hooklying bil hip abduction green band 2x10 with exhale Hooklying alt marching green band 2x10 with exhale Sidelying clams green band 2x10 each  2x10 bridges 2x10 sit to stands with cues for posture and pelvic floor activation  Pt educated on upright midline posture cues as she does sacral sit and round forward and reports with improved posture her tailbone was much less.   07/08/24: Pt educated on bladder irritants and urge drill - handouts given Manual at abdomen for fascial release, scar tissue mobility pt in prone - indirect release technique completed with skilled PT for tissue mobility - pt very tender in several areas with increased tension on Lt abdomen and bil lower abdomen. Pt also educated on self massage here at home.   07/17/24: 2x10 quick flicks in sitting - improved awareness with towel roll X10 5s isometrics sitting with towel roll 2x10 pelvic floor contractions Seated marching with  pelvic floor contractions 2x10 Sit to stands 9# x10 with pelvic floor contractions and exhale Standing pelvic floor contractions x10 Educated on updated HEP and urge drill (pt reports she has had a hard time remembering urge drill and needed review)   PATIENT EDUCATION:  Education details: Interior and spatial designer Person educated: Patient Education method: Programmer, multimedia, Demonstration, Actor cues, Verbal cues, and Handouts Education comprehension: verbalized understanding, returned demonstration, verbal cues  required, tactile cues required, and needs further education  HOME EXERCISE PROGRAM: Access Code: [email or web address] URL: [email or web address] Date: 06/16/2024 Prepared by: Darryle   Exercises - Supine Diaphragmatic Breathing  - 1 x daily - 7 x weekly - 1 sets - 10 reps - Supine Lower Trunk Rotation  - 1 x daily - 7 x weekly - 1 sets - 10 reps - Supported Teacher, music with Pelvic Floor Relaxation  - 1 x daily - 7 x weekly - 1 sets - 3 reps - 30s holds - Seated Overhead Reach Stretch  - 1 x daily - 7 x weekly - 1 sets - 10 reps  ASSESSMENT:  CLINICAL IMPRESSION: Patient is a 73 y.o. female  who was seen today for physical therapy treatment for pelvic, abdominal, back pain, urinary incontinence, and increased urinary frequency. Pt tolerated session well today with coordination of pelvic floor and breathing with all excises to improved pelvic floor strength and decreased leakage.Pt would benefit from additional PT to further address deficits.    OBJECTIVE IMPAIRMENTS: decreased activity tolerance, decreased coordination, decreased endurance, decreased mobility, difficulty walking, decreased strength, increased fascial restrictions, increased muscle spasms, impaired flexibility, improper body mechanics, postural dysfunction, and pain.   ACTIVITY LIMITATIONS: carrying, lifting, sitting, standing, transfers, continence, and locomotion level  PARTICIPATION LIMITATIONS: meal prep, cleaning, laundry, interpersonal relationship, community activity, and occupation  PERSONAL FACTORS: Fitness, Time since onset of injury/illness/exacerbation, and 1 comorbidity: medical history are also affecting patient's functional outcome.   REHAB POTENTIAL: Good  CLINICAL DECISION MAKING: Stable/uncomplicated  EVALUATION COMPLEXITY: Low   GOALS: Goals reviewed with patient? Yes  SHORT TERM GOALS: Target date: 07/14/24  Pt to be I with HEP for carry over and continuing recommendations for improved  outcomes.   Baseline: Goal status: INITIAL  2.  Pt to be I with pressure management techniques to decreased stress at pelvic floor for improved tolerance to standing and walking without pelvic symptoms.  Baseline:  Goal status: INITIAL  3.  Pt to be I with scar massage for improved abdominal tissue mobility to decreased urinary frequency to better tolerate traveling.  Baseline:  Goal status: INITIAL  4.  Pt will be independent with the knack, urge suppression technique, and double voiding in order to improve bladder habits and decrease urinary incontinence for improved skin integrity.  Baseline:  Goal status: INITIAL   LONG TERM GOALS: Target date: 09/16/24  Pt to be I with advanced HEP for carry over and continuing recommendations for improved outcomes.   Baseline:  Goal status: INITIAL  2.  Pt to demonstrate improved coordination of pelvic floor and breathing mechanics with 10# squat with appropriate synergistic patterns to decrease pain and leakage at least 75% of the time for improved ability to complete a 30 minute walking without strain at pelvic floor and symptoms.    Baseline:  Goal status: INITIAL  3. Pt to demonstrate at least 4+/5 bil hip strength for improved pelvic stability and functional squats without leakage.  Baseline:  Goal status: INITIAL  4.  Pt to demonstrate improved core strength with ability to complete  sit up test 2/3 without pain or pelvic symptoms.  Baseline:  Goal status: INITIAL  5.  Pt to report no more than one instance of urinary incontinence in a day for improved skin integrity and confidence to leave for errands.  Baseline:  Goal status: INITIAL  6.  Pt to report no more than 3/10 pain at hip/pelvic complex due to improved mobility and strength of surrounding structures to tolerate at least 1 hour of standing and walking without worsening pain.  Baseline:  Goal status: INITIAL  PLAN:  PT FREQUENCY: 2x/week  PT DURATION: 16  sessions  PLANNED INTERVENTIONS: 97110-Therapeutic exercises, 97530- Therapeutic activity, 97112- Neuromuscular re-education, 97535- Self Care, 02859- Manual therapy, [id number]- Canalith repositioning, [id number]- Aquatic Therapy, [phone]- Electrical stimulation (manual), [id number]- Vasopneumatic device, [id number]- Ultrasound, [id number]- Traction (mechanical), [id number]- Ionotophoresis 4mg /ml Dexamethasone, [id number] (1-2 muscles), [id number] (3+ muscles)- Dry Needling, Patient/Family education, Balance training, Taping, Joint mobilization, Spinal mobilization, Scar mobilization, DME instructions, Cryotherapy, Moist heat, and Biofeedback  PLAN FOR NEXT SESSION: manual at abdomen, low back and glutes, core and hip strength, stretching hips and spine, coordination breathing and transverse abdominis activation activation    Darryle Navy, PT, DPT 08/21/254:00 PM  [facility] [address], [address] [place], [place] [address] Phone # [phone] Fax [phone]

## 2024-07-21 ENCOUNTER — Encounter: Admitting: Physical Therapy

## 2024-07-24 ENCOUNTER — Ambulatory Visit: Admitting: Physical Therapy

## 2024-07-24 DIAGNOSIS — M6281 Muscle weakness (generalized): Secondary | ICD-10-CM

## 2024-07-24 DIAGNOSIS — R279 Unspecified lack of coordination: Secondary | ICD-10-CM | POA: Diagnosis not present

## 2024-07-24 DIAGNOSIS — R293 Abnormal posture: Secondary | ICD-10-CM

## 2024-07-24 DIAGNOSIS — M62838 Other muscle spasm: Secondary | ICD-10-CM

## 2024-07-24 NOTE — Therapy (Signed)
 OUTPATIENT PHYSICAL THERAPY FEMALE PELVIC TREATMENT   Patient Name: Cindy Dudley MRN: [id number] DOB:07/04/1951, 73 y.o., female Today's Date: 07/24/2024   PT End of Session - 07/24/24 1407     Visit Number 5    Date for PT Re-Evaluation 09/16/24    Authorization Type [employer] medicare    Authorization Time Period [employer] APPROVED: 06/16/2024 - 08/11/2024 16 Visit(s)    Authorization - Visit Number 5    Authorization - Number of Visits 16    Progress Note Due on Visit 10    PT Start Time 1402    PT Stop Time 1442    PT Time Calculation (min) 40 min    Activity Tolerance Patient tolerated treatment well;Patient limited by pain    Behavior During Therapy [facility] for tasks assessed/performed          PT End of Session - 07/24/24 1407     Visit Number 5    Date for PT Re-Evaluation 09/16/24    Authorization Type [employer] medicare    Authorization Time Period [employer] APPROVED: 06/16/2024 - 08/11/2024 16 Visit(s)    Authorization - Visit Number 5    Authorization - Number of Visits 16    Progress Note Due on Visit 10    PT Start Time 1402    PT Stop Time 1442    PT Time Calculation (min) 40 min    Activity Tolerance Patient tolerated treatment well;Patient limited by pain    Behavior During Therapy WFL for tasks assessed/performed           Past Medical History:  Diagnosis Date   Anemia    Asthma    Blood transfusion    Brain tumor (HCC) 1995   Bruises easily    Bursitis    left knee   Colitis, ulcerative (HCC) 1969   Fatigue    Fibroid tumor 1982   Hearing loss    Kidney stone    Ovarian cyst 1981   Rash    Sore throat    Spasmodic dysphonia 1982   Spasmodic torticollis 2007   Ulcer    Wears glasses    Past Surgical History:  Procedure Laterality Date   ABDOMINAL HYSTERECTOMY  1982   BRAIN TUMOR EXCISION  1995   CHOLECYSTECTOMY  2016   ILEOSTOMY  1982   LARYNX SURGERY  2000   OVARIAN CYST REMOVAL     Patient Active Problem List   Diagnosis Date Noted   Mid back  pain 05/16/2024   Pelvic pain 05/16/2024   Ileostomy stenosis (HCC) 09/28/2023   Irritant contact dermatitis associated with fecal stoma 08/26/2023   Ileostomy care (HCC) 08/26/2023   Titubation 08/07/2022   Bilateral hearing loss 08/07/2022   Benign meningioma of brain (HCC) 09/18/2018   Hereditary essential tremor 09/18/2018   Thyroid  nodule, uninodular, left 10/17/2011    PCP: Antonio Cyndee Jamee JONELLE, DO   REFERRING PROVIDER: Antonio Cyndee Jamee JONELLE, DO   REFERRING DIAG: R10.2 (ICD-10-CM) - Pelvic pain  THERAPY DIAG:  Muscle weakness (generalized)  Other muscle spasm  Abnormal posture  Unspecified lack of coordination  Rationale for Evaluation and Treatment: Rehabilitation  ONSET DATE: chronic   SUBJECTIVE:  SUBJECTIVE STATEMENT: Pt reports back pain is back same as prior to PT (other PT for back), stools are about the same consistency with ostomy, has been doing urge drill and now going 2 instead of 3. Not really having leakage at night now.   32oz but limits to that to decreased urination  PAIN:  Are you having pain? Yes NPRS scale: 1/10 Pain location: bil pelvis, abdomen and back  Pain type: sharp Pain description: intermittent   Aggravating factors: sitting is constant, standing longer than 15 mins, housework, walking for 30 mins, changing positions Relieving factors: lying down flat  PRECAUTIONS: None  RED FLAGS: None   WEIGHT BEARING RESTRICTIONS: No  FALLS:  Has patient fallen in last 6 months? No  OCCUPATION: retired   ACTIVITY LEVEL : low  PLOF: Independent  PATIENT GOALS: to have no pain, wants to stand at least 1 hour without pain  PERTINENT HISTORY:  Coccyx pain,  pelvic floor myalgia, possibly related to nerve damage (per MD note) or surgery, multiple  hernia, ostomy, ABDOMINAL HYSTERECTOMY,  Sexual abuse: No  BOWEL MOVEMENT: Uses ostomy since 88s and I  not concerns at this time  URINATION: Pain with urination: No Fully empty bladder: Yes:   Stream: Strong and Weak Urgency: No Frequency: every 30 mins if drinking water, limits due to this and can sometimes make it an hour - 1.5, 3x night Leakage: Urge to void sometimes  Pads: No  INTERCOURSE:  Ability to have vaginal penetration Yes  Pain with intercourse: none DrynessYes  Climax: unable and reports decreased sensation  Marinoff Scale: 0/3  PREGNANCY: Vaginal deliveries 3  C-section deliveries 0 Currently pregnant No  PROLAPSE: None   OBJECTIVE:  Note: Objective measures were completed at Evaluation unless otherwise noted.  DIAGNOSTIC FINDINGS:    PATIENT SURVEYS:   PFIQ-7: 95  COGNITION: Overall cognitive status: Within functional limits for tasks assessed     SENSATION: Light touch: Appears intact  LUMBAR SPECIAL TESTS:  SI Compression/distraction test: both increased pain at abdomen however back pain improved with compression  FUNCTIONAL TESTS:  Functional squats - unable to complete in proper mechanics, bil knee valgus, trunk flexion and decreased descent by 75%  Sit up test - 0/3 GAIT: Decreased cadence, trunk flexion, Rt rib rounding, decreased step height bil, decreased stride length  POSTURE: rounded shoulders, forward head, increased thoracic kyphosis, posterior pelvic tilt, and flexed trunk    LUMBARAROM/PROM:  A/PROM A/PROM  eval  Flexion WFL but with pain  Extension Limited by 25%  Right lateral flexion Limited by 50%  Left lateral flexion Limited by 50%  Right rotation Limited by 50%  Left rotation Limited by 50%   (Blank rows = not tested)  LOWER EXTREMITY ROM: Bil hamstrings and adductors limited by 50%  LOWER EXTREMITY MMT:  Bil hips grossly 3+/5 PALPATION:   General: tightness at bil thoracic and lumbar paraspinals,  TTP at Lt SIJ, Lt gluteal and bil piriformis   Pelvic Alignment: Rt hip hike  Abdominal: TTP at mid quadrant, and lt mid quadrant; scar present throughout midline of abdomen and poor mobility throughout                 External Perineal Exam: dryness and redness present; most redness present at introitus                             Internal Pelvic Floor: TTP throughout bil superficial and deep layers  Patient confirms identification and approves PT to assess internal pelvic floor and treatment Yes No emotional/communication barriers or cognitive limitation. Patient is motivated to learn. Patient understands and agrees with treatment goals and plan. PT explains patient will be examined in standing, sitting, and lying down to see how their muscles and joints work. When they are ready, they will be asked to remove their underwear so PT can examine their perineum. The patient is also given the option of providing their own chaperone as one is not provided in our facility. The patient also has the right and is explained the right to defer or refuse any part of the evaluation or treatment including the internal exam. With the patient's consent, PT will use one gloved finger to gently assess the muscles of the pelvic floor, seeing how well it contracts and relaxes and if there is muscle symmetry. After, the patient will get dressed and PT and patient will discuss exam findings and plan of care. PT and patient discuss plan of care, schedule, attendance policy and HEP activities.  PELVIC MMT:   MMT eval  Vaginal 1/5 - unable to complete consistently, often bearing down  Internal Anal Sphincter   External Anal Sphincter   Puborectalis   Diastasis Recti   (Blank rows = not tested)        TONE: Decreased   PROLAPSE: Possible anterior vaginal wall laxity present with coughing in hooklying   TODAY'S TREATMENT:                                                                                                                               DATE:   07/08/24: Pt educated on bladder irritants and urge drill - handouts given Manual at abdomen for fascial release, scar tissue mobility pt in prone - indirect release technique completed with skilled PT for tissue mobility - pt very tender in several areas with increased tension on Lt abdomen and bil lower abdomen. Pt also educated on self massage here at home.   07/17/24: 2x10 quick flicks in sitting - improved awareness with towel roll X10 5s isometrics sitting with towel roll 2x10 pelvic floor contractions Seated marching with pelvic floor contractions 2x10 Sit to stands 9# x10 with pelvic floor contractions and exhale Standing pelvic floor contractions x10 Educated on updated HEP and urge drill (pt reports she has had a hard time remembering urge drill and needed review)   07/24/24: Transverse abdominis activation with exhale - max cues for techniques with pt demonstrated abdominal bulging and anterior pelvic tilt initially, improved with tactile cues and ball squeeze at hands to coordinate 2x10 Hooklying opposite hand/knee ball press 2x10 cat/cow x10 Tail wags x10 Alt quad leg lifts x15 - max cues for transverse abdominis activation 2x10 10# sit to stands from mat table + exhale Seated row 2x10 green band  Seated diagonals green band 2x10 each Seated on disc 2x10 pelvic tilts Seated on disc pelvic circles 2x10 Seated  hip abduction green band 2x10 with pelvic floor contraction Farmers carry 150' each hand 10#  PATIENT EDUCATION:  Education details: Interior and spatial designer Person educated: Patient Education method: Programmer, multimedia, Demonstration, Actor cues, Verbal cues, and Handouts Education comprehension: verbalized understanding, returned demonstration, verbal cues required, tactile cues required, and needs further education  HOME EXERCISE PROGRAM: Access Code: [email or web address] URL: [email or web address] Date: 07/24/2024 Prepared by: Darryle    Exercises - Supine Diaphragmatic Breathing  - 1 x daily - 7 x weekly - 1 sets - 10 reps - Supine Lower Trunk Rotation  - 1 x daily - 7 x weekly - 1 sets - 10 reps - Supported Teacher, music with Pelvic Floor Relaxation  - 1 x daily - 7 x weekly - 1 sets - 3 reps - 30s holds - Seated Overhead Reach Stretch  - 1 x daily - 7 x weekly - 1 sets - 10 reps - Hooklying Clamshell with Resistance  - 1 x daily - 7 x weekly - 2 sets - 10 reps - Sit to Stand with Pelvic Floor Contraction  - 1 x daily - 7 x weekly - 2 sets - 10 reps - pelvic floor contract and holds  - 1 x daily - 7 x weekly - 2 sets - 10 reps - 5s holds - Seated Quick Flick Pelvic Floor Contractions  - 1 x daily - 7 x weekly - 2 sets - 10 reps - Seated Transversus Abdominis Bracing  - 1 x daily - 7 x weekly - 2 sets - 10 reps - Seated Shoulder Diagonal Pulls with Resistance  - 1 x daily - 7 x weekly - 2 sets - 10 reps - seated pelvic circles  - 1 x daily - 7 x weekly - 2 sets - 10 reps  ASSESSMENT:  CLINICAL IMPRESSION: Patient is a 73 y.o. female  who was seen today for physical therapy treatment for pelvic, abdominal, back pain, urinary incontinence, and increased urinary frequency. Has had improvement with night time voids but still increased frequency overall but improving. Pt tolerated session well today with coordination of pelvic floor and hip and core strengthening for improved pelvic floor strength and improved posture to decreased strain at pelvic floor. .Pt would benefit from additional PT to further address deficits.    OBJECTIVE IMPAIRMENTS: decreased activity tolerance, decreased coordination, decreased endurance, decreased mobility, difficulty walking, decreased strength, increased fascial restrictions, increased muscle spasms, impaired flexibility, improper body mechanics, postural dysfunction, and pain.   ACTIVITY LIMITATIONS: carrying, lifting, sitting, standing, transfers, continence, and locomotion  level  PARTICIPATION LIMITATIONS: meal prep, cleaning, laundry, interpersonal relationship, community activity, and occupation  PERSONAL FACTORS: Fitness, Time since onset of injury/illness/exacerbation, and 1 comorbidity: medical history are also affecting patient's functional outcome.   REHAB POTENTIAL: Good  CLINICAL DECISION MAKING: Stable/uncomplicated  EVALUATION COMPLEXITY: Low   GOALS: Goals reviewed with patient? Yes  SHORT TERM GOALS: Target date: 07/14/24  Pt to be I with HEP for carry over and continuing recommendations for improved outcomes.   Baseline: Goal status: INITIAL  2.  Pt to be I with pressure management techniques to decreased stress at pelvic floor for improved tolerance to standing and walking without pelvic symptoms.  Baseline:  Goal status: INITIAL  3.  Pt to be I with scar massage for improved abdominal tissue mobility to decreased urinary frequency to better tolerate traveling.  Baseline:  Goal status: INITIAL  4.  Pt will be independent with the knack, urge suppression technique, and double voiding in order to  improve bladder habits and decrease urinary incontinence for improved skin integrity.  Baseline:  Goal status: INITIAL   LONG TERM GOALS: Target date: 09/16/24  Pt to be I with advanced HEP for carry over and continuing recommendations for improved outcomes.   Baseline:  Goal status: INITIAL  2.  Pt to demonstrate improved coordination of pelvic floor and breathing mechanics with 10# squat with appropriate synergistic patterns to decrease pain and leakage at least 75% of the time for improved ability to complete a 30 minute walking without strain at pelvic floor and symptoms.    Baseline:  Goal status: INITIAL  3. Pt to demonstrate at least 4+/5 bil hip strength for improved pelvic stability and functional squats without leakage.  Baseline:  Goal status: INITIAL  4.  Pt to demonstrate improved core strength with ability to complete  sit up test 2/3 without pain or pelvic symptoms.  Baseline:  Goal status: INITIAL  5.  Pt to report no more than one instance of urinary incontinence in a day for improved skin integrity and confidence to leave for errands.  Baseline:  Goal status: INITIAL  6.  Pt to report no more than 3/10 pain at hip/pelvic complex due to improved mobility and strength of surrounding structures to tolerate at least 1 hour of standing and walking without worsening pain.  Baseline:  Goal status: INITIAL  PLAN:  PT FREQUENCY: 2x/week  PT DURATION: 16 sessions  PLANNED INTERVENTIONS: 97110-Therapeutic exercises, 97530- Therapeutic activity, 97112- Neuromuscular re-education, 97535- Self Care, [id number]- Manual therapy, [phone]- Canalith repositioning, [id number]- Aquatic Therapy, [phone]- Electrical stimulation (manual), [id number]- Vasopneumatic device, [id number]- Ultrasound, [id number]- Traction (mechanical), [id number]- Ionotophoresis 4mg /ml Dexamethasone, [id number] (1-2 muscles), [id number] (3+ muscles)- Dry Needling, Patient/Family education, Balance training, Taping, Joint mobilization, Spinal mobilization, Scar mobilization, DME instructions, Cryotherapy, Moist heat, and Biofeedback  PLAN FOR NEXT SESSION: manual at abdomen, low back and glutes, core and hip strength, stretching hips and spine, coordination breathing and transverse abdominis activation activation    Darryle Navy, PT, DPT 08/28/252:49 PM  [facility] [address], [address] [place], [place] [address] Phone # [phone] Fax [phone]

## 2024-07-29 ENCOUNTER — Ambulatory Visit: Payer: Self-pay | Attending: Family Medicine | Admitting: Physical Therapy

## 2024-07-29 DIAGNOSIS — M62838 Other muscle spasm: Secondary | ICD-10-CM | POA: Diagnosis present

## 2024-07-29 DIAGNOSIS — M6281 Muscle weakness (generalized): Secondary | ICD-10-CM | POA: Diagnosis present

## 2024-07-29 DIAGNOSIS — R293 Abnormal posture: Secondary | ICD-10-CM | POA: Insufficient documentation

## 2024-07-29 DIAGNOSIS — R279 Unspecified lack of coordination: Secondary | ICD-10-CM | POA: Insufficient documentation

## 2024-07-29 NOTE — Therapy (Signed)
 OUTPATIENT PHYSICAL THERAPY FEMALE PELVIC TREATMENT   Patient Name: Cindy Dudley MRN: [id number] DOB:Apr 15, 1951, 73 y.o., female Today's Date: 07/29/2024   PT End of Session - 07/29/24 1406     Visit Number 6    Date for PT Re-Evaluation 09/16/24    Authorization Type [employer] medicare    Authorization Time Period [employer] APPROVED: 06/16/2024 - 08/11/2024 16 Visit(s)    Authorization - Visit Number 6    Authorization - Number of Visits 16    Progress Note Due on Visit 10    PT Start Time 1403    PT Stop Time 1445    PT Time Calculation (min) 42 min    Activity Tolerance Patient tolerated treatment well;Patient limited by pain    Behavior During Therapy [facility] for tasks assessed/performed          PT End of Session - 07/29/24 1406     Visit Number 6    Date for PT Re-Evaluation 09/16/24    Authorization Type [employer] medicare    Authorization Time Period [employer] APPROVED: 06/16/2024 - 08/11/2024 16 Visit(s)    Authorization - Visit Number 6    Authorization - Number of Visits 16    Progress Note Due on Visit 10    PT Start Time 1403    PT Stop Time 1445    PT Time Calculation (min) 42 min    Activity Tolerance Patient tolerated treatment well;Patient limited by pain    Behavior During Therapy WFL for tasks assessed/performed           Past Medical History:  Diagnosis Date   Anemia    Asthma    Blood transfusion    Brain tumor (HCC) 1995   Bruises easily    Bursitis    left knee   Colitis, ulcerative (HCC) 1969   Fatigue    Fibroid tumor 1982   Hearing loss    Kidney stone    Ovarian cyst 1981   Rash    Sore throat    Spasmodic dysphonia 1982   Spasmodic torticollis 2007   Ulcer    Wears glasses    Past Surgical History:  Procedure Laterality Date   ABDOMINAL HYSTERECTOMY  1982   BRAIN TUMOR EXCISION  1995   CHOLECYSTECTOMY  2016   ILEOSTOMY  1982   LARYNX SURGERY  2000   OVARIAN CYST REMOVAL     Patient Active Problem List   Diagnosis Date Noted   Mid back  pain 05/16/2024   Pelvic pain 05/16/2024   Ileostomy stenosis (HCC) 09/28/2023   Irritant contact dermatitis associated with fecal stoma 08/26/2023   Ileostomy care (HCC) 08/26/2023   Titubation 08/07/2022   Bilateral hearing loss 08/07/2022   Benign meningioma of brain (HCC) 09/18/2018   Hereditary essential tremor 09/18/2018   Thyroid  nodule, uninodular, left 10/17/2011    PCP: Antonio Cyndee Jamee JONELLE, DO   REFERRING PROVIDER: Antonio Cyndee Jamee JONELLE, DO   REFERRING DIAG: R10.2 (ICD-10-CM) - Pelvic pain  THERAPY DIAG:  Muscle weakness (generalized)  Other muscle spasm  Abnormal posture  Unspecified lack of coordination  Rationale for Evaluation and Treatment: Rehabilitation  ONSET DATE: chronic   SUBJECTIVE:  SUBJECTIVE STATEMENT: Now night time urine voids 1-2x, day is about the same still. Back pain bothering her today, 6/10 after packing house to move.  Hasn't had abdominal pain since last appointment. Still needs to change ostomy more frequently then prior.SABRA collie but limits to that to decreased urination  PAIN:  Are you having pain? Yes NPRS scale: 6/10 Pain location: mid back pain  Pain type: sharp Pain description: intermittent   Aggravating factors: sitting is constant, standing longer than 15 mins, housework, walking for 30 mins, changing positions Relieving factors: lying down flat  PRECAUTIONS: None  RED FLAGS: None   WEIGHT BEARING RESTRICTIONS: No  FALLS:  Has patient fallen in last 6 months? No  OCCUPATION: retired   ACTIVITY LEVEL : low  PLOF: Independent  PATIENT GOALS: to have no pain, wants to stand at least 1 hour without pain  PERTINENT HISTORY:  Coccyx pain,  pelvic floor myalgia, possibly related to nerve damage (per MD note) or surgery, multiple  hernia, ostomy, ABDOMINAL HYSTERECTOMY,  Sexual abuse: No  BOWEL MOVEMENT: Uses ostomy since 16s and I  not concerns at this time  URINATION: Pain with urination: No Fully empty bladder: Yes:   Stream: Strong and Weak Urgency: No Frequency: every 30 mins if drinking water, limits due to this and can sometimes make it an hour - 1.5, 3x night Leakage: Urge to void sometimes  Pads: No  INTERCOURSE:  Ability to have vaginal penetration Yes  Pain with intercourse: none DrynessYes  Climax: unable and reports decreased sensation  Marinoff Scale: 0/3  PREGNANCY: Vaginal deliveries 3  C-section deliveries 0 Currently pregnant No  PROLAPSE: None   OBJECTIVE:  Note: Objective measures were completed at Evaluation unless otherwise noted.  DIAGNOSTIC FINDINGS:    PATIENT SURVEYS:   PFIQ-7: 95  COGNITION: Overall cognitive status: Within functional limits for tasks assessed     SENSATION: Light touch: Appears intact  LUMBAR SPECIAL TESTS:  SI Compression/distraction test: both increased pain at abdomen however back pain improved with compression  FUNCTIONAL TESTS:  Functional squats - unable to complete in proper mechanics, bil knee valgus, trunk flexion and decreased descent by 75%  Sit up test - 0/3 GAIT: Decreased cadence, trunk flexion, Rt rib rounding, decreased step height bil, decreased stride length  POSTURE: rounded shoulders, forward head, increased thoracic kyphosis, posterior pelvic tilt, and flexed trunk    LUMBARAROM/PROM:  A/PROM A/PROM  eval  Flexion WFL but with pain  Extension Limited by 25%  Right lateral flexion Limited by 50%  Left lateral flexion Limited by 50%  Right rotation Limited by 50%  Left rotation Limited by 50%   (Blank rows = not tested)  LOWER EXTREMITY ROM: Bil hamstrings and adductors limited by 50%  LOWER EXTREMITY MMT:  Bil hips grossly 3+/5 PALPATION:   General: tightness at bil thoracic and lumbar paraspinals,  TTP at Lt SIJ, Lt gluteal and bil piriformis   Pelvic Alignment: Rt hip hike  Abdominal: TTP at mid quadrant, and lt mid quadrant; scar present throughout midline of abdomen and poor mobility throughout                 External Perineal Exam: dryness and redness present; most redness present at introitus                             Internal Pelvic Floor: TTP throughout bil superficial and deep layers  Patient confirms identification  and approves PT to assess internal pelvic floor and treatment Yes No emotional/communication barriers or cognitive limitation. Patient is motivated to learn. Patient understands and agrees with treatment goals and plan. PT explains patient will be examined in standing, sitting, and lying down to see how their muscles and joints work. When they are ready, they will be asked to remove their underwear so PT can examine their perineum. The patient is also given the option of providing their own chaperone as one is not provided in our facility. The patient also has the right and is explained the right to defer or refuse any part of the evaluation or treatment including the internal exam. With the patient's consent, PT will use one gloved finger to gently assess the muscles of the pelvic floor, seeing how well it contracts and relaxes and if there is muscle symmetry. After, the patient will get dressed and PT and patient will discuss exam findings and plan of care. PT and patient discuss plan of care, schedule, attendance policy and HEP activities.  PELVIC MMT:   MMT eval  Vaginal 1/5 - unable to complete consistently, often bearing down  Internal Anal Sphincter   External Anal Sphincter   Puborectalis   Diastasis Recti   (Blank rows = not tested)        TONE: Decreased   PROLAPSE: Possible anterior vaginal wall laxity present with coughing in hooklying   TODAY'S TREATMENT:                                                                                                                               DATE:   07/17/24: 2x10 quick flicks in sitting - improved awareness with towel roll X10 5s isometrics sitting with towel roll 2x10 pelvic floor contractions Seated marching with pelvic floor contractions 2x10 Sit to stands 9# x10 with pelvic floor contractions and exhale Standing pelvic floor contractions x10 Educated on updated HEP and urge drill (pt reports she has had a hard time remembering urge drill and needed review)   07/24/24: Transverse abdominis activation with exhale - max cues for techniques with pt demonstrated abdominal bulging and anterior pelvic tilt initially, improved with tactile cues and ball squeeze at hands to coordinate 2x10 Hooklying opposite hand/knee ball press 2x10 cat/cow x10 Tail wags x10 Alt quad leg lifts x15 - max cues for transverse abdominis activation 2x10 10# sit to stands from mat table + exhale Seated row 2x10 green band  Seated diagonals green band 2x10 each Seated on disc 2x10 pelvic tilts Seated on disc pelvic circles 2x10 Seated hip abduction green band 2x10 with pelvic floor contraction Farmers carry 150' each hand 10#  07/29/24: Bridges 2x10 Lumbar rotation x15 each Single knee to chest 2x30s Piriformis stretch 2x30s Clam blue band 2x10 each - reports tailbone pain with this Sit to stands 2x10 with pelvic floor contraction and glute contraction to support tailbone Seated pelvic floor coordination with breathing - contraction with exhale  and lengthening with inhale 2x10 Seated pelvic floor isometrics 10x10s Seated quick flicks x15 2# mario punches 2x10 2# forward bent rows 2x10 Sit to stands 10# each hand 2x10   PATIENT EDUCATION:  Education details: Interior and spatial designer Person educated: Patient Education method: Programmer, multimedia, Facilities manager, Actor cues, Verbal cues, and Handouts Education comprehension: verbalized understanding, returned demonstration, verbal cues required, tactile cues required, and needs  further education  HOME EXERCISE PROGRAM: Access Code: [email or web address] URL: [email or web address] Date: 07/24/2024 Prepared by: Darryle   Exercises - Supine Diaphragmatic Breathing  - 1 x daily - 7 x weekly - 1 sets - 10 reps - Supine Lower Trunk Rotation  - 1 x daily - 7 x weekly - 1 sets - 10 reps - Supported Teacher, music with Pelvic Floor Relaxation  - 1 x daily - 7 x weekly - 1 sets - 3 reps - 30s holds - Seated Overhead Reach Stretch  - 1 x daily - 7 x weekly - 1 sets - 10 reps - Hooklying Clamshell with Resistance  - 1 x daily - 7 x weekly - 2 sets - 10 reps - Sit to Stand with Pelvic Floor Contraction  - 1 x daily - 7 x weekly - 2 sets - 10 reps - pelvic floor contract and holds  - 1 x daily - 7 x weekly - 2 sets - 10 reps - 5s holds - Seated Quick Flick Pelvic Floor Contractions  - 1 x daily - 7 x weekly - 2 sets - 10 reps - Seated Transversus Abdominis Bracing  - 1 x daily - 7 x weekly - 2 sets - 10 reps - Seated Shoulder Diagonal Pulls with Resistance  - 1 x daily - 7 x weekly - 2 sets - 10 reps - seated pelvic circles  - 1 x daily - 7 x weekly - 2 sets - 10 reps  ASSESSMENT:  CLINICAL IMPRESSION: Patient is a 73 y.o. female  who was seen today for physical therapy treatment for pelvic, abdominal, back pain, urinary incontinence, and increased urinary frequency. Has had improvement with night time voids 1-2x now but still increased frequency during the day but improving. Pt tolerated session well today with coordination of pelvic floor and hip and core strengthening for improved pelvic floor strength and improved posture to decreased strain at pelvic floor. Pt reports some back and tailbone pain since moving a lot of boxes this weekend.Pt would benefit from additional PT to further address deficits.    OBJECTIVE IMPAIRMENTS: decreased activity tolerance, decreased coordination, decreased endurance, decreased mobility, difficulty walking, decreased strength, increased  fascial restrictions, increased muscle spasms, impaired flexibility, improper body mechanics, postural dysfunction, and pain.   ACTIVITY LIMITATIONS: carrying, lifting, sitting, standing, transfers, continence, and locomotion level  PARTICIPATION LIMITATIONS: meal prep, cleaning, laundry, interpersonal relationship, community activity, and occupation  PERSONAL FACTORS: Fitness, Time since onset of injury/illness/exacerbation, and 1 comorbidity: medical history are also affecting patient's functional outcome.   REHAB POTENTIAL: Good  CLINICAL DECISION MAKING: Stable/uncomplicated  EVALUATION COMPLEXITY: Low   GOALS: Goals reviewed with patient? Yes  SHORT TERM GOALS: Target date: 07/14/24  Pt to be I with HEP for carry over and continuing recommendations for improved outcomes.   Baseline: Goal status: INITIAL  2.  Pt to be I with pressure management techniques to decreased stress at pelvic floor for improved tolerance to standing and walking without pelvic symptoms.  Baseline:  Goal status: INITIAL  3.  Pt to be I with scar massage for improved abdominal tissue  mobility to decreased urinary frequency to better tolerate traveling.  Baseline:  Goal status: INITIAL  4.  Pt will be independent with the knack, urge suppression technique, and double voiding in order to improve bladder habits and decrease urinary incontinence for improved skin integrity.  Baseline:  Goal status: INITIAL   LONG TERM GOALS: Target date: 09/16/24  Pt to be I with advanced HEP for carry over and continuing recommendations for improved outcomes.   Baseline:  Goal status: INITIAL  2.  Pt to demonstrate improved coordination of pelvic floor and breathing mechanics with 10# squat with appropriate synergistic patterns to decrease pain and leakage at least 75% of the time for improved ability to complete a 30 minute walking without strain at pelvic floor and symptoms.    Baseline:  Goal status:  INITIAL  3. Pt to demonstrate at least 4+/5 bil hip strength for improved pelvic stability and functional squats without leakage.  Baseline:  Goal status: INITIAL  4.  Pt to demonstrate improved core strength with ability to complete sit up test 2/3 without pain or pelvic symptoms.  Baseline:  Goal status: INITIAL  5.  Pt to report no more than one instance of urinary incontinence in a day for improved skin integrity and confidence to leave for errands.  Baseline:  Goal status: INITIAL  6.  Pt to report no more than 3/10 pain at hip/pelvic complex due to improved mobility and strength of surrounding structures to tolerate at least 1 hour of standing and walking without worsening pain.  Baseline:  Goal status: INITIAL  PLAN:  PT FREQUENCY: 2x/week  PT DURATION: 16 sessions  PLANNED INTERVENTIONS: 97110-Therapeutic exercises, 97530- Therapeutic activity, 97112- Neuromuscular re-education, 97535- Self Care, [id number]- Manual therapy, [phone]- Canalith repositioning, [id number]- Aquatic Therapy, [phone]- Electrical stimulation (manual), [id number]- Vasopneumatic device, [id number]- Ultrasound, [id number]- Traction (mechanical), [id number]- Ionotophoresis 4mg /ml Dexamethasone, [id number] (1-2 muscles), [id number] (3+ muscles)- Dry Needling, Patient/Family education, Balance training, Taping, Joint mobilization, Spinal mobilization, Scar mobilization, DME instructions, Cryotherapy, Moist heat, and Biofeedback  PLAN FOR NEXT SESSION: manual at abdomen, low back and glutes, core and hip strength, stretching hips and spine, coordination breathing and transverse abdominis activation activation    Darryle Navy, PT, DPT 09/02/253:03 PM  [facility] [address] [place], [place] [address] Phone # [phone] Fax [phone]

## 2024-07-31 ENCOUNTER — Encounter: Admitting: Physical Therapy

## 2024-08-04 ENCOUNTER — Encounter: Admitting: Physical Therapy

## 2024-08-06 ENCOUNTER — Ambulatory Visit: Admitting: Physical Therapy

## 2024-08-06 DIAGNOSIS — M62838 Other muscle spasm: Secondary | ICD-10-CM

## 2024-08-06 DIAGNOSIS — R293 Abnormal posture: Secondary | ICD-10-CM

## 2024-08-06 DIAGNOSIS — M6281 Muscle weakness (generalized): Secondary | ICD-10-CM | POA: Diagnosis not present

## 2024-08-06 NOTE — Addendum Note (Signed)
 Addended by: Micholas Drumwright S on: 08/06/2024 04:50 PM   Modules accepted: Orders

## 2024-08-06 NOTE — Therapy (Addendum)
 OUTPATIENT PHYSICAL THERAPY FEMALE PELVIC TREATMENT   Patient Name: Cindy Dudley MRN: [id number] DOB:August 22, 1951, 73 y.o., female Today's Date: 08/06/2024 Progress Note Reporting Period 06/16/24 to 08/06/24  See note below for Objective Data and Assessment of Progress/Goals.      PT End of Session - 08/06/24 1538     Visit Number 7    Date for PT Re-Evaluation 09/16/24    Authorization Type [employer] medicare    Authorization Time Period [employer] APPROVED: 06/16/2024 - 08/11/2024 16 Visit(s)    Authorization - Visit Number 7    Authorization - Number of Visits 16    Progress Note Due on Visit 10    PT Start Time 1532    PT Stop Time 1613    PT Time Calculation (min) 41 min    Activity Tolerance Patient tolerated treatment well;Patient limited by pain    Behavior During Therapy [facility] for tasks assessed/performed          PT End of Session - 08/06/24 1538     Visit Number 7    Date for PT Re-Evaluation 09/16/24    Authorization Type [employer] medicare    Authorization Time Period [employer] APPROVED: 06/16/2024 - 08/11/2024 16 Visit(s)    Authorization - Visit Number 7    Authorization - Number of Visits 16    Progress Note Due on Visit 10    PT Start Time 1532    PT Stop Time 1613    PT Time Calculation (min) 41 min    Activity Tolerance Patient tolerated treatment well;Patient limited by pain    Behavior During Therapy WFL for tasks assessed/performed           Past Medical History:  Diagnosis Date   Anemia    Asthma    Blood transfusion    Brain tumor (HCC) 1995   Bruises easily    Bursitis    left knee   Colitis, ulcerative (HCC) 1969   Fatigue    Fibroid tumor 1982   Hearing loss    Kidney stone    Ovarian cyst 1981   Rash    Sore throat    Spasmodic dysphonia 1982   Spasmodic torticollis 2007   Ulcer    Wears glasses    Past Surgical History:  Procedure Laterality Date   ABDOMINAL HYSTERECTOMY  1982   BRAIN TUMOR EXCISION  1995   CHOLECYSTECTOMY  2016    ILEOSTOMY  1982   LARYNX SURGERY  2000   OVARIAN CYST REMOVAL     Patient Active Problem List   Diagnosis Date Noted   Mid back pain 05/16/2024   Pelvic pain 05/16/2024   Ileostomy stenosis (HCC) 09/28/2023   Irritant contact dermatitis associated with fecal stoma 08/26/2023   Ileostomy care (HCC) 08/26/2023   Titubation 08/07/2022   Bilateral hearing loss 08/07/2022   Benign meningioma of brain (HCC) 09/18/2018   Hereditary essential tremor 09/18/2018   Thyroid  nodule, uninodular, left 10/17/2011    PCP: Antonio Cyndee Jamee JONELLE, DO   REFERRING PROVIDER: Antonio Cyndee Jamee JONELLE, DO   REFERRING DIAG: R10.2 (ICD-10-CM) - Pelvic pain  THERAPY DIAG:  Muscle weakness (generalized)  Other muscle spasm  Abnormal posture  Rationale for Evaluation and Treatment: Rehabilitation  ONSET DATE: chronic   SUBJECTIVE:  SUBJECTIVE STATEMENT: Tailbone pain (8-10/10) and mid back pain (8/10) most bothersome . Worse with sitting and housework. Has noted improvement with ostomy (was needing to change every 4-5 days, but since surgery 03/2023 has changed daily), with increased water intake now able to make it every 2 days. Around 50 oz on good days and has no urinary incontinence, no worsening frequency, and better ostomy output. Has not had any groin pain in a few weeks.   32oz but limits to that to decreased urination  PAIN:  Are you having pain? Yes NPRS scale: 3/10 Pain location: mid back pain  Pain type: sharp Pain description: intermittent   Aggravating factors: sitting is constant, standing longer than 15 mins, housework, walking for 30 mins, changing positions Relieving factors: lying down flat  PRECAUTIONS: None  RED FLAGS: None   WEIGHT BEARING RESTRICTIONS: No  FALLS:  Has patient fallen in  last 6 months? No  OCCUPATION: retired   ACTIVITY LEVEL : low  PLOF: Independent  PATIENT GOALS: to have no pain, wants to stand at least 1 hour without pain  PERTINENT HISTORY:  Coccyx pain,  pelvic floor myalgia, possibly related to nerve damage (per MD note) or surgery, multiple hernia, ostomy, ABDOMINAL HYSTERECTOMY,  Sexual abuse: No  BOWEL MOVEMENT: Uses ostomy since 41s and I  not concerns at this time  URINATION: Pain with urination: No Fully empty bladder: Yes:   Stream: Strong and Weak Urgency: No Frequency: every 30 mins if drinking water, limits due to this and can sometimes make it an hour - 1.5, 3x night Leakage: Urge to void sometimes  Pads: No  INTERCOURSE:  Ability to have vaginal penetration Yes  Pain with intercourse: none DrynessYes  Climax: unable and reports decreased sensation  Marinoff Scale: 0/3  PREGNANCY: Vaginal deliveries 3  C-section deliveries 0 Currently pregnant No  PROLAPSE: None   OBJECTIVE:  Note: Objective measures were completed at Evaluation unless otherwise noted.  DIAGNOSTIC FINDINGS:    PATIENT SURVEYS:   PFIQ-7: 95 08/06/24: PFIQ-7: 52  COGNITION: Overall cognitive status: Within functional limits for tasks assessed     SENSATION: Light touch: Appears intact  LUMBAR SPECIAL TESTS:  SI Compression/distraction test: both increased pain at abdomen however back pain improved with compression  FUNCTIONAL TESTS:  Functional squats - unable to complete in proper mechanics, bil knee valgus, trunk flexion and decreased descent by 75%  Sit up test - 0/3 GAIT: Decreased cadence, trunk flexion, Rt rib rounding, decreased step height bil, decreased stride length  POSTURE: rounded shoulders, forward head, increased thoracic kyphosis, posterior pelvic tilt, and flexed trunk    LUMBARAROM/PROM:  A/PROM A/PROM  eval  Flexion WFL but with pain  Extension Limited by 25%  Right lateral flexion Limited by 50%  Left  lateral flexion Limited by 50%  Right rotation Limited by 50%  Left rotation Limited by 50%   (Blank rows = not tested)  LOWER EXTREMITY ROM: Bil hamstrings and adductors limited by 50%  LOWER EXTREMITY MMT:  Bil hips grossly 3+/5 PALPATION:   General: tightness at bil thoracic and lumbar paraspinals, TTP at Lt SIJ, Lt gluteal and bil piriformis   Pelvic Alignment: Rt hip hike  Abdominal: TTP at mid quadrant, and lt mid quadrant; scar present throughout midline of abdomen and poor mobility throughout                 External Perineal Exam: dryness and redness present; most redness present at introitus  Internal Pelvic Floor: TTP throughout bil superficial and deep layers  Patient confirms identification and approves PT to assess internal pelvic floor and treatment Yes No emotional/communication barriers or cognitive limitation. Patient is motivated to learn. Patient understands and agrees with treatment goals and plan. PT explains patient will be examined in standing, sitting, and lying down to see how their muscles and joints work. When they are ready, they will be asked to remove their underwear so PT can examine their perineum. The patient is also given the option of providing their own chaperone as one is not provided in our facility. The patient also has the right and is explained the right to defer or refuse any part of the evaluation or treatment including the internal exam. With the patient's consent, PT will use one gloved finger to gently assess the muscles of the pelvic floor, seeing how well it contracts and relaxes and if there is muscle symmetry. After, the patient will get dressed and PT and patient will discuss exam findings and plan of care. PT and patient discuss plan of care, schedule, attendance policy and HEP activities.  PELVIC MMT:   MMT eval  Vaginal 1/5 - unable to complete consistently, often bearing down  Internal Anal Sphincter    External Anal Sphincter   Puborectalis   Diastasis Recti   (Blank rows = not tested)        TONE: Decreased   PROLAPSE: Possible anterior vaginal wall laxity present with coughing in hooklying   TODAY'S TREATMENT:                                                                                                                              DATE:   07/17/24: 2x10 quick flicks in sitting - improved awareness with towel roll X10 5s isometrics sitting with towel roll 2x10 pelvic floor contractions Seated marching with pelvic floor contractions 2x10 Sit to stands 9# x10 with pelvic floor contractions and exhale Standing pelvic floor contractions x10 Educated on updated HEP and urge drill (pt reports she has had a hard time remembering urge drill and needed review)   07/24/24: Transverse abdominis activation with exhale - max cues for techniques with pt demonstrated abdominal bulging and anterior pelvic tilt initially, improved with tactile cues and ball squeeze at hands to coordinate 2x10 Hooklying opposite hand/knee ball press 2x10 cat/cow x10 Tail wags x10 Alt quad leg lifts x15 - max cues for transverse abdominis activation 2x10 10# sit to stands from mat table + exhale Seated row 2x10 green band  Seated diagonals green band 2x10 each Seated on disc 2x10 pelvic tilts Seated on disc pelvic circles 2x10 Seated hip abduction green band 2x10 with pelvic floor contraction Farmers carry 150' each hand 10#  07/29/24: Bridges 2x10 Lumbar rotation x15 each Single knee to chest 2x30s Piriformis stretch 2x30s Clam blue band 2x10 each - reports tailbone pain with this Sit to stands 2x10 with pelvic floor contraction and glute  contraction to support tailbone Seated pelvic floor coordination with breathing - contraction with exhale and lengthening with inhale 2x10 Seated pelvic floor isometrics 10x10s Seated quick flicks x15 2# mario punches 2x10 2# forward bent rows 2x10 Sit to stands  10# each hand 2x10  08/06/24: Reviewed bladder diary - greatly improved bowel output with 50% reduction in ostomy changes needed (closer to pt's normal), no night time urination since appointment, no leakage, no worse urinary frequency  Ant/post rocking with x15 with 10s isometrics with posterior rocks Tail wags x10 Standing pelvic tilts with ball at low back for improved feedback x20 Educated on towel roll in horseshow shape for improved comfort with sitting to give coccyx more mobility and less pain  Pelvic circles on disc x10 each Seated blue band bil shoulder horizontal abduction 2x10 Seated piriformis stretch 3x30s each   PATIENT EDUCATION:  Education details: Interior and spatial designer Person educated: Patient Education method: Programmer, multimedia, Facilities manager, Actor cues, Verbal cues, and Handouts Education comprehension: verbalized understanding, returned demonstration, verbal cues required, tactile cues required, and needs further education  HOME EXERCISE PROGRAM: Access Code: [email or web address] URL: [email or web address] Date: 07/24/2024 Prepared by: Darryle   Exercises - Supine Diaphragmatic Breathing  - 1 x daily - 7 x weekly - 1 sets - 10 reps - Supine Lower Trunk Rotation  - 1 x daily - 7 x weekly - 1 sets - 10 reps - Supported Teacher, music with Pelvic Floor Relaxation  - 1 x daily - 7 x weekly - 1 sets - 3 reps - 30s holds - Seated Overhead Reach Stretch  - 1 x daily - 7 x weekly - 1 sets - 10 reps - Hooklying Clamshell with Resistance  - 1 x daily - 7 x weekly - 2 sets - 10 reps - Sit to Stand with Pelvic Floor Contraction  - 1 x daily - 7 x weekly - 2 sets - 10 reps - pelvic floor contract and holds  - 1 x daily - 7 x weekly - 2 sets - 10 reps - 5s holds - Seated Quick Flick Pelvic Floor Contractions  - 1 x daily - 7 x weekly - 2 sets - 10 reps - Seated Transversus Abdominis Bracing  - 1 x daily - 7 x weekly - 2 sets - 10 reps - Seated Shoulder Diagonal Pulls with Resistance  - 1  x daily - 7 x weekly - 2 sets - 10 reps - seated pelvic circles  - 1 x daily - 7 x weekly - 2 sets - 10 reps  ASSESSMENT:  CLINICAL IMPRESSION: Patient is a 73 y.o. female  who was seen today for physical therapy treatment for pelvic, abdominal, back pain, urinary incontinence, and increased urinary frequency. Pt improving with all symptoms. Has no longer had groin pain though tailbone and back pain still present. Urinary and bowel symptoms are also improving. Pt progressing toward all goals, having less pain, almost no urinary incontinence, and 50% reduction in ostomy supply needs, and increasing water intake to 50-60 oz per day with mild increased in frequency to 1x an hour instead of 30 at eval and pt only drinking 15-20 oz of water daily. Pt reports some back and tailbone pain since moving a lot of boxes this weekend.Pt would benefit from additional PT to further address deficits.    OBJECTIVE IMPAIRMENTS: decreased activity tolerance, decreased coordination, decreased endurance, decreased mobility, difficulty walking, decreased strength, increased fascial restrictions, increased muscle spasms, impaired flexibility, improper body mechanics, postural dysfunction, and pain.  ACTIVITY LIMITATIONS: carrying, lifting, sitting, standing, transfers, continence, and locomotion level  PARTICIPATION LIMITATIONS: meal prep, cleaning, laundry, interpersonal relationship, community activity, and occupation  PERSONAL FACTORS: Fitness, Time since onset of injury/illness/exacerbation, and 1 comorbidity: medical history are also affecting patient's functional outcome.   REHAB POTENTIAL: Good  CLINICAL DECISION MAKING: Stable/uncomplicated  EVALUATION COMPLEXITY: Low   GOALS: Goals reviewed with patient? Yes  SHORT TERM GOALS: Target date: 07/14/24  Pt to be I with HEP for carry over and continuing recommendations for improved outcomes.   Baseline: Goal status: MET  2.  Pt to be I with pressure  management techniques to decreased stress at pelvic floor for improved tolerance to standing and walking without pelvic symptoms.  Baseline:  Goal status: MET  3.  Pt to be I with scar massage for improved abdominal tissue mobility to decreased urinary frequency to better tolerate traveling.  Baseline:  Goal status: MET - I with knowing how to do it but does forget to do it  4.  Pt will be independent with the knack, urge suppression technique, and double voiding in order to improve bladder habits and decrease urinary incontinence for improved skin integrity.  Baseline:  Goal status: MET   LONG TERM GOALS: Target date: 09/16/24  Pt to be I with advanced HEP for carry over and continuing recommendations for improved outcomes.   Baseline:  Goal status: ong oing  2.  Pt to demonstrate improved coordination of pelvic floor and breathing mechanics with 10# squat with appropriate synergistic patterns to decrease pain and leakage at least 75% of the time for improved ability to complete a 30 minute walking without strain at pelvic floor and symptoms.    Baseline:  Goal status: on going  3. Pt to demonstrate at least 4+/5 bil hip strength for improved pelvic stability and functional squats without leakage.  Baseline:  Goal status: on going  4.  Pt to demonstrate improved core strength with ability to complete sit up test 2/3 without pain or pelvic symptoms.  Baseline:  Goal status: on going  5.  Pt to report no more than one instance of urinary incontinence in a day for improved skin integrity and confidence to leave for errands.  Baseline:  Goal status: MET  6.  Pt to report no more than 3/10 pain at hip/pelvic complex due to improved mobility and strength of surrounding structures to tolerate at least 1 hour of standing and walking without worsening pain.  Baseline:  Goal status: groin pain resolved but back and tailbone pain still present   PLAN:  PT FREQUENCY: 2x/week  PT  DURATION: 20 sessions  PLANNED INTERVENTIONS: 97110-Therapeutic exercises, 97530- Therapeutic activity, 97112- Neuromuscular re-education, 97535- Self Care, [id number]- Manual therapy, [id number]- Canalith repositioning, [id number]- Aquatic Therapy, [phone]- Electrical stimulation (manual), [id number]- Vasopneumatic device, [id number]- Ultrasound, [id number]- Traction (mechanical), [id number]- Ionotophoresis 4mg /ml Dexamethasone, [id number] (1-2 muscles), [id number] (3+ muscles)- Dry Needling, Patient/Family education, Balance training, Taping, Joint mobilization, Spinal mobilization, Scar mobilization, DME instructions, Cryotherapy, Moist heat, and Biofeedback  PLAN FOR NEXT SESSION: manual at abdomen, low back and glutes, core and hip strength, stretching hips and spine, coordination breathing and transverse abdominis activation activation    Darryle Navy, PT, DPT 09/10/254:49 PM  [facility] [address], [address] [place], [place] [address] Phone # [phone] Fax [phone]

## 2024-08-11 ENCOUNTER — Ambulatory Visit: Admitting: Physical Therapy

## 2024-08-11 DIAGNOSIS — M6281 Muscle weakness (generalized): Secondary | ICD-10-CM

## 2024-08-11 DIAGNOSIS — R279 Unspecified lack of coordination: Secondary | ICD-10-CM

## 2024-08-11 DIAGNOSIS — R293 Abnormal posture: Secondary | ICD-10-CM

## 2024-08-11 DIAGNOSIS — M62838 Other muscle spasm: Secondary | ICD-10-CM

## 2024-08-11 NOTE — Therapy (Signed)
 OUTPATIENT PHYSICAL THERAPY FEMALE PELVIC TREATMENT   Patient Name: Cindy Dudley MRN: [id number] DOB:Dec 20, 1950, 73 y.o., female Today's Date: 08/11/2024  Past Medical History:  Diagnosis Date   Anemia    Asthma    Blood transfusion    Brain tumor (HCC) 1995   Bruises easily    Bursitis    left knee   Colitis, ulcerative (HCC) 1969   Fatigue    Fibroid tumor 1982   Hearing loss    Kidney stone    Ovarian cyst 1981   Rash    Sore throat    Spasmodic dysphonia 1982   Spasmodic torticollis 2007   Ulcer    Wears glasses    Past Surgical History:  Procedure Laterality Date   ABDOMINAL HYSTERECTOMY  1982   BRAIN TUMOR EXCISION  1995   CHOLECYSTECTOMY  2016   ILEOSTOMY  1982   LARYNX SURGERY  2000   OVARIAN CYST REMOVAL     Patient Active Problem List   Diagnosis Date Noted   Mid back pain 05/16/2024   Pelvic pain 05/16/2024   Ileostomy stenosis (HCC) 09/28/2023   Irritant contact dermatitis associated with fecal stoma 08/26/2023   Ileostomy care (HCC) 08/26/2023   Titubation 08/07/2022   Bilateral hearing loss 08/07/2022   Benign meningioma of brain (HCC) 09/18/2018   Hereditary essential tremor 09/18/2018   Thyroid  nodule, uninodular, left 10/17/2011    PCP: Antonio Cyndee Jamee JONELLE, DO   REFERRING PROVIDER: Antonio Cyndee Jamee JONELLE, DO   REFERRING DIAG: R10.2 (ICD-10-CM) - Pelvic pain  THERAPY DIAG:  Muscle weakness (generalized)  Unspecified lack of coordination  Other muscle spasm  Abnormal posture  Rationale for Evaluation and Treatment: Rehabilitation  ONSET DATE: chronic   SUBJECTIVE:                                                                                                                                                                                           SUBJECTIVE STATEMENT: Tailbone pain has been a lot better, doing exercises and is helping. Back pain also decreased since last visit. Changing ostomy bag daily at most every other,  having caking. Reports she can't figure out the right amount of miralax to take. Surgeon told pt to try to increase miralax as needed for improved output. Currently taking random amounts of miralax daily reports based on what she eats and doesn't take miralax at the same time daily.  Does see improvement with caking with increase in fluid intake.  Does note increased urinary incontinence with first morning void and had a little urinary incontinence trying to sit to empty bladder.   32oz but  limits to that to decreased urination  PAIN:  No pain 9/15 Are you having pain? Yes NPRS scale: 3/10 Pain location: mid back pain  Pain type: sharp Pain description: intermittent   Aggravating factors: sitting is constant, standing longer than 15 mins, housework, walking for 30 mins, changing positions Relieving factors: lying down flat  PRECAUTIONS: None  RED FLAGS: None   WEIGHT BEARING RESTRICTIONS: No  FALLS:  Has patient fallen in last 6 months? No  OCCUPATION: retired   ACTIVITY LEVEL : low  PLOF: Independent  PATIENT GOALS: to have no pain, wants to stand at least 1 hour without pain  PERTINENT HISTORY:  Coccyx pain,  pelvic floor myalgia, possibly related to nerve damage (per MD note) or surgery, multiple hernia, ostomy, ABDOMINAL HYSTERECTOMY,  Sexual abuse: No  BOWEL MOVEMENT: Uses ostomy since 53s and I  not concerns at this time  URINATION: Pain with urination: No Fully empty bladder: Yes:   Stream: Strong and Weak Urgency: No Frequency: every 30 mins if drinking water, limits due to this and can sometimes make it an hour - 1.5, 3x night Leakage: Urge to void sometimes  Pads: No  INTERCOURSE:  Ability to have vaginal penetration Yes  Pain with intercourse: none DrynessYes  Climax: unable and reports decreased sensation  Marinoff Scale: 0/3  PREGNANCY: Vaginal deliveries 3  C-section deliveries 0 Currently pregnant No  PROLAPSE: None   OBJECTIVE:   Note: Objective measures were completed at Evaluation unless otherwise noted.  DIAGNOSTIC FINDINGS:    PATIENT SURVEYS:   PFIQ-7: 95 08/06/24: PFIQ-7: 52  COGNITION: Overall cognitive status: Within functional limits for tasks assessed     SENSATION: Light touch: Appears intact  LUMBAR SPECIAL TESTS:  SI Compression/distraction test: both increased pain at abdomen however back pain improved with compression  FUNCTIONAL TESTS:  Functional squats - unable to complete in proper mechanics, bil knee valgus, trunk flexion and decreased descent by 75%  Sit up test - 0/3 GAIT: Decreased cadence, trunk flexion, Rt rib rounding, decreased step height bil, decreased stride length  POSTURE: rounded shoulders, forward head, increased thoracic kyphosis, posterior pelvic tilt, and flexed trunk    LUMBARAROM/PROM:  A/PROM A/PROM  eval  Flexion WFL but with pain  Extension Limited by 25%  Right lateral flexion Limited by 50%  Left lateral flexion Limited by 50%  Right rotation Limited by 50%  Left rotation Limited by 50%   (Blank rows = not tested)  LOWER EXTREMITY ROM: Bil hamstrings and adductors limited by 50%  LOWER EXTREMITY MMT:  Bil hips grossly 3+/5 PALPATION:   General: tightness at bil thoracic and lumbar paraspinals, TTP at Lt SIJ, Lt gluteal and bil piriformis   Pelvic Alignment: Rt hip hike  Abdominal: TTP at mid quadrant, and lt mid quadrant; scar present throughout midline of abdomen and poor mobility throughout                 External Perineal Exam: dryness and redness present; most redness present at introitus                             Internal Pelvic Floor: TTP throughout bil superficial and deep layers  Patient confirms identification and approves PT to assess internal pelvic floor and treatment Yes No emotional/communication barriers or cognitive limitation. Patient is motivated to learn. Patient understands and agrees with treatment goals and plan. PT  explains patient will be examined in standing, sitting,  and lying down to see how their muscles and joints work. When they are ready, they will be asked to remove their underwear so PT can examine their perineum. The patient is also given the option of providing their own chaperone as one is not provided in our facility. The patient also has the right and is explained the right to defer or refuse any part of the evaluation or treatment including the internal exam. With the patient's consent, PT will use one gloved finger to gently assess the muscles of the pelvic floor, seeing how well it contracts and relaxes and if there is muscle symmetry. After, the patient will get dressed and PT and patient will discuss exam findings and plan of care. PT and patient discuss plan of care, schedule, attendance policy and HEP activities.  PELVIC MMT:   MMT eval  Vaginal 1/5 - unable to complete consistently, often bearing down  Internal Anal Sphincter   External Anal Sphincter   Puborectalis   Diastasis Recti   (Blank rows = not tested)        TONE: Decreased   PROLAPSE: Possible anterior vaginal wall laxity present with coughing in hooklying   TODAY'S TREATMENT:                                                                                                                              DATE:   07/29/24: Bridges 2x10 Lumbar rotation x15 each Single knee to chest 2x30s Piriformis stretch 2x30s Clam blue band 2x10 each - reports tailbone pain with this Sit to stands 2x10 with pelvic floor contraction and glute contraction to support tailbone Seated pelvic floor coordination with breathing - contraction with exhale and lengthening with inhale 2x10 Seated pelvic floor isometrics 10x10s Seated quick flicks x15 2# mario punches 2x10 2# forward bent rows 2x10 Sit to stands 10# each hand 2x10  08/06/24: Reviewed bladder diary - greatly improved bowel output with 50% reduction in ostomy changes needed  (closer to pt's normal), no night time urination since appointment, no leakage, no worse urinary frequency  Ant/post rocking with x15 with 10s isometrics with posterior rocks Tail wags x10 Standing pelvic tilts with ball at low back for improved feedback x20 Educated on towel roll in horseshow shape for improved comfort with sitting to give coccyx more mobility and less pain  Pelvic circles on disc x10 each Seated blue band bil shoulder horizontal abduction 2x10 Seated piriformis stretch 3x30s each  08/11/25: Patient consented to internal pelvic floor treatment vaginally this date and found to have similar strength, endurance, and coordination as to eval with demonstrations of pushing downward with attempts to contract. Extra time with NMRE with visualizations, muscle tapping for improved muscle activations. Pt was able to stop with pushing and demonstrated 1/5 strength with contractions however with moderate cues. Pt does continue to demonstrate anterior vaginal wall laxity though doesn't appear worse than eval and pt denies pressure feeling.  Pt dressed with privacy,  PT dscuess with pt findings, reviewed goals, and possible benefit of pt having referral to urogyn for further assessment of laxity for possible pessary. Pt verbalized understanding but as she is still having increased urinary frequency and leakage of urine this referral might be helpful for additional information or recommendations. Pt requested PT to message referring provider about this, PT completed this today. As of time of this note, no response yet.   PATIENT EDUCATION:  Education details: Interior and spatial designer Person educated: Patient Education method: Programmer, multimedia, Facilities manager, Actor cues, Verbal cues, and Handouts Education comprehension: verbalized understanding, returned demonstration, verbal cues required, tactile cues required, and needs further education  HOME EXERCISE PROGRAM: Access Code: [email or web address] URL:  [email or web address] Date: 07/24/2024 Prepared by: Darryle   Exercises - Supine Diaphragmatic Breathing  - 1 x daily - 7 x weekly - 1 sets - 10 reps - Supine Lower Trunk Rotation  - 1 x daily - 7 x weekly - 1 sets - 10 reps - Supported Teacher, music with Pelvic Floor Relaxation  - 1 x daily - 7 x weekly - 1 sets - 3 reps - 30s holds - Seated Overhead Reach Stretch  - 1 x daily - 7 x weekly - 1 sets - 10 reps - Hooklying Clamshell with Resistance  - 1 x daily - 7 x weekly - 2 sets - 10 reps - Sit to Stand with Pelvic Floor Contraction  - 1 x daily - 7 x weekly - 2 sets - 10 reps - pelvic floor contract and holds  - 1 x daily - 7 x weekly - 2 sets - 10 reps - 5s holds - Seated Quick Flick Pelvic Floor Contractions  - 1 x daily - 7 x weekly - 2 sets - 10 reps - Seated Transversus Abdominis Bracing  - 1 x daily - 7 x weekly - 2 sets - 10 reps - Seated Shoulder Diagonal Pulls with Resistance  - 1 x daily - 7 x weekly - 2 sets - 10 reps - seated pelvic circles  - 1 x daily - 7 x weekly - 2 sets - 10 reps  ASSESSMENT:  CLINICAL IMPRESSION: Patient is a 73 y.o. female  who was seen today for physical therapy treatment for pelvic, abdominal, back pain, urinary incontinence, and increased urinary frequency. Continues to have back and tailbone pain, increased day time frequency and urinary incontinence, and ostomy outputs are not at her normal per pt. Pt demonstrated better in all three with increased water intake but pt very fearful of leaving home due to symptoms if she drinks water.  Pt does continue to progress with less tailbone and back pain, today found to have similar pelvic floor findings with MMTs with pt consent and due to all of this, PT recommended pt follow up with urogyn for additional recommendations for her symptoms in addition to PFPT. Pt would benefit from additional PT to further address deficits.    OBJECTIVE IMPAIRMENTS: decreased activity tolerance, decreased  coordination, decreased endurance, decreased mobility, difficulty walking, decreased strength, increased fascial restrictions, increased muscle spasms, impaired flexibility, improper body mechanics, postural dysfunction, and pain.   ACTIVITY LIMITATIONS: carrying, lifting, sitting, standing, transfers, continence, and locomotion level  PARTICIPATION LIMITATIONS: meal prep, cleaning, laundry, interpersonal relationship, community activity, and occupation  PERSONAL FACTORS: Fitness, Time since onset of injury/illness/exacerbation, and 1 comorbidity: medical history are also affecting patient's functional outcome.   REHAB POTENTIAL: Good  CLINICAL DECISION MAKING: Stable/uncomplicated  EVALUATION COMPLEXITY: Low   GOALS: Goals reviewed with patient?  Yes  SHORT TERM GOALS: Target date: 07/14/24  Pt to be I with HEP for carry over and continuing recommendations for improved outcomes.   Baseline: Goal status: MET  2.  Pt to be I with pressure management techniques to decreased stress at pelvic floor for improved tolerance to standing and walking without pelvic symptoms.  Baseline:  Goal status: MET  3.  Pt to be I with scar massage for improved abdominal tissue mobility to decreased urinary frequency to better tolerate traveling.  Baseline:  Goal status: MET - I with knowing how to do it but does forget to do it  4.  Pt will be independent with the knack, urge suppression technique, and double voiding in order to improve bladder habits and decrease urinary incontinence for improved skin integrity.  Baseline:  Goal status: MET   LONG TERM GOALS: Target date: 09/16/24  Pt to be I with advanced HEP for carry over and continuing recommendations for improved outcomes.   Baseline:  Goal status: ong oing  2.  Pt to demonstrate improved coordination of pelvic floor and breathing mechanics with 10# squat with appropriate synergistic patterns to decrease pain and leakage at least 75% of the  time for improved ability to complete a 30 minute walking without strain at pelvic floor and symptoms.    Baseline:  Goal status: on going  3. Pt to demonstrate at least 4+/5 bil hip strength for improved pelvic stability and functional squats without leakage.  Baseline:  Goal status: on going  4.  Pt to demonstrate improved core strength with ability to complete sit up test 2/3 without pain or pelvic symptoms.  Baseline:  Goal status: on going  5.  Pt to report no more than one instance of urinary incontinence in a day for improved skin integrity and confidence to leave for errands.  Baseline:  Goal status: MET  6.  Pt to report no more than 3/10 pain at hip/pelvic complex due to improved mobility and strength of surrounding structures to tolerate at least 1 hour of standing and walking without worsening pain.  Baseline:  Goal status: groin pain resolved but back and tailbone pain still present   PLAN:  PT FREQUENCY: 2x/week  PT DURATION: 20 sessions  PLANNED INTERVENTIONS: 97110-Therapeutic exercises, 97530- Therapeutic activity, 97112- Neuromuscular re-education, 97535- Self Care, [id number]- Manual therapy, [phone]- Canalith repositioning, [id number]- Aquatic Therapy, [phone]- Electrical stimulation (manual), [id number]- Vasopneumatic device, [id number]- Ultrasound, [id number]- Traction (mechanical), [id number]- Ionotophoresis 4mg /ml Dexamethasone, [id number] (1-2 muscles), [id number] (3+ muscles)- Dry Needling, Patient/Family education, Balance training, Taping, Joint mobilization, Spinal mobilization, Scar mobilization, DME instructions, Cryotherapy, Moist heat, and Biofeedback  PLAN FOR NEXT SESSION: manual at abdomen, low back and glutes, core and hip strength, stretching hips and spine, coordination breathing and transverse abdominis activation activation    Darryle Navy, PT, DPT 08/12/2511:30 PM  [facility] [address] [place], [place] [address] Phone # [phone] Fax  [phone]

## 2024-08-13 ENCOUNTER — Other Ambulatory Visit: Payer: Self-pay | Admitting: Family Medicine

## 2024-08-13 ENCOUNTER — Ambulatory Visit: Admitting: Physical Therapy

## 2024-08-13 DIAGNOSIS — M62838 Other muscle spasm: Secondary | ICD-10-CM

## 2024-08-13 DIAGNOSIS — M6281 Muscle weakness (generalized): Secondary | ICD-10-CM

## 2024-08-13 DIAGNOSIS — M545 Low back pain, unspecified: Secondary | ICD-10-CM

## 2024-08-13 DIAGNOSIS — R279 Unspecified lack of coordination: Secondary | ICD-10-CM

## 2024-08-13 DIAGNOSIS — R293 Abnormal posture: Secondary | ICD-10-CM

## 2024-08-13 DIAGNOSIS — R32 Unspecified urinary incontinence: Secondary | ICD-10-CM

## 2024-08-13 NOTE — Therapy (Signed)
 OUTPATIENT PHYSICAL THERAPY FEMALE PELVIC TREATMENT   Patient Name: Cindy Dudley MRN: [id number] DOB:1950-12-19, 73 y.o., female Today's Date: 08/13/2024  Past Medical History:  Diagnosis Date   Anemia    Asthma    Blood transfusion    Brain tumor (HCC) 1995   Bruises easily    Bursitis    left knee   Colitis, ulcerative (HCC) 1969   Fatigue    Fibroid tumor 1982   Hearing loss    Kidney stone    Ovarian cyst 1981   Rash    Sore throat    Spasmodic dysphonia 1982   Spasmodic torticollis 2007   Ulcer    Wears glasses    Past Surgical History:  Procedure Laterality Date   ABDOMINAL HYSTERECTOMY  1982   BRAIN TUMOR EXCISION  1995   CHOLECYSTECTOMY  2016   ILEOSTOMY  1982   LARYNX SURGERY  2000   OVARIAN CYST REMOVAL     Patient Active Problem List   Diagnosis Date Noted   Mid back pain 05/16/2024   Pelvic pain 05/16/2024   Ileostomy stenosis (HCC) 09/28/2023   Irritant contact dermatitis associated with fecal stoma 08/26/2023   Ileostomy care (HCC) 08/26/2023   Titubation 08/07/2022   Bilateral hearing loss 08/07/2022   Benign meningioma of brain (HCC) 09/18/2018   Hereditary essential tremor 09/18/2018   Thyroid  nodule, uninodular, left 10/17/2011    PCP: Antonio Cyndee Jamee JONELLE, DO   REFERRING PROVIDER: Antonio Cyndee Jamee JONELLE, DO   REFERRING DIAG: R10.2 (ICD-10-CM) - Pelvic pain  THERAPY DIAG:  Muscle weakness (generalized)  Unspecified lack of coordination  Other muscle spasm  Abnormal posture  Rationale for Evaluation and Treatment: Rehabilitation  ONSET DATE: chronic   SUBJECTIVE:                                                                                                                                                                                           SUBJECTIVE STATEMENT: HEP still helping tailbone and been doing more activity though pain does still have same intensity, just able to do more before it happens.  Does note same  urinary incontinence with first morning void and had a little urinary incontinence trying to sit to empty bladder.   32oz but limits to that to decreased urination  PAIN:  No pain 9/17  Are you having pain? Yes NPRS scale: 3/10 Pain location: mid back pain  Pain type: sharp Pain description: intermittent   Aggravating factors: sitting is constant, standing longer than 15 mins, housework, walking for 30 mins, changing positions Relieving factors: lying down flat  PRECAUTIONS: None  RED FLAGS:  None   WEIGHT BEARING RESTRICTIONS: No  FALLS:  Has patient fallen in last 6 months? No  OCCUPATION: retired   ACTIVITY LEVEL : low  PLOF: Independent  PATIENT GOALS: to have no pain, wants to stand at least 1 hour without pain  PERTINENT HISTORY:  Coccyx pain,  pelvic floor myalgia, possibly related to nerve damage (per MD note) or surgery, multiple hernia, ostomy, ABDOMINAL HYSTERECTOMY,  Sexual abuse: No  BOWEL MOVEMENT: Uses ostomy since 17s and I  not concerns at this time  URINATION: Pain with urination: No Fully empty bladder: Yes:   Stream: Strong and Weak Urgency: No Frequency: every 30 mins if drinking water, limits due to this and can sometimes make it an hour - 1.5, 3x night Leakage: Urge to void sometimes  Pads: No  INTERCOURSE:  Ability to have vaginal penetration Yes  Pain with intercourse: none DrynessYes  Climax: unable and reports decreased sensation  Marinoff Scale: 0/3  PREGNANCY: Vaginal deliveries 3  C-section deliveries 0 Currently pregnant No  PROLAPSE: None   OBJECTIVE:  Note: Objective measures were completed at Evaluation unless otherwise noted.  DIAGNOSTIC FINDINGS:    PATIENT SURVEYS:   PFIQ-7: 95 08/06/24: PFIQ-7: 52  COGNITION: Overall cognitive status: Within functional limits for tasks assessed     SENSATION: Light touch: Appears intact  LUMBAR SPECIAL TESTS:  SI Compression/distraction test: both increased pain  at abdomen however back pain improved with compression  FUNCTIONAL TESTS:  Functional squats - unable to complete in proper mechanics, bil knee valgus, trunk flexion and decreased descent by 75%  Sit up test - 0/3 GAIT: Decreased cadence, trunk flexion, Rt rib rounding, decreased step height bil, decreased stride length  POSTURE: rounded shoulders, forward head, increased thoracic kyphosis, posterior pelvic tilt, and flexed trunk    LUMBARAROM/PROM:  A/PROM A/PROM  eval  Flexion WFL but with pain  Extension Limited by 25%  Right lateral flexion Limited by 50%  Left lateral flexion Limited by 50%  Right rotation Limited by 50%  Left rotation Limited by 50%   (Blank rows = not tested)  LOWER EXTREMITY ROM: Bil hamstrings and adductors limited by 50%  LOWER EXTREMITY MMT:  Bil hips grossly 3+/5 PALPATION:   General: tightness at bil thoracic and lumbar paraspinals, TTP at Lt SIJ, Lt gluteal and bil piriformis   Pelvic Alignment: Rt hip hike  Abdominal: TTP at mid quadrant, and lt mid quadrant; scar present throughout midline of abdomen and poor mobility throughout                 External Perineal Exam: dryness and redness present; most redness present at introitus                             Internal Pelvic Floor: TTP throughout bil superficial and deep layers  Patient confirms identification and approves PT to assess internal pelvic floor and treatment Yes No emotional/communication barriers or cognitive limitation. Patient is motivated to learn. Patient understands and agrees with treatment goals and plan. PT explains patient will be examined in standing, sitting, and lying down to see how their muscles and joints work. When they are ready, they will be asked to remove their underwear so PT can examine their perineum. The patient is also given the option of providing their own chaperone as one is not provided in our facility. The patient also has the right and is explained the  right to defer  or refuse any part of the evaluation or treatment including the internal exam. With the patient's consent, PT will use one gloved finger to gently assess the muscles of the pelvic floor, seeing how well it contracts and relaxes and if there is muscle symmetry. After, the patient will get dressed and PT and patient will discuss exam findings and plan of care. PT and patient discuss plan of care, schedule, attendance policy and HEP activities.  PELVIC MMT:   MMT eval  Vaginal 1/5 - unable to complete consistently, often bearing down  Internal Anal Sphincter   External Anal Sphincter   Puborectalis   Diastasis Recti   (Blank rows = not tested)        TONE: Decreased   PROLAPSE: Possible anterior vaginal wall laxity present with coughing in hooklying   TODAY'S TREATMENT:                                                                                                                              DATE:   08/06/24: Reviewed bladder diary - greatly improved bowel output with 50% reduction in ostomy changes needed (closer to pt's normal), no night time urination since appointment, no leakage, no worse urinary frequency  Ant/post rocking with x15 with 10s isometrics with posterior rocks Tail wags x10 Standing pelvic tilts with ball at low back for improved feedback x20 Educated on towel roll in horseshow shape for improved comfort with sitting to give coccyx more mobility and less pain  Pelvic circles on disc x10 each Seated blue band bil shoulder horizontal abduction 2x10 Seated piriformis stretch 3x30s each  08/11/25: Patient consented to internal pelvic floor treatment vaginally this date and found to have similar strength, endurance, and coordination as to eval with demonstrations of pushing downward with attempts to contract. Extra time with NMRE with visualizations, muscle tapping for improved muscle activations. Pt was able to stop with pushing and demonstrated 1/5 strength  with contractions however with moderate cues. Pt does continue to demonstrate anterior vaginal wall laxity though doesn't appear worse than eval and pt denies pressure feeling.  Pt dressed with privacy, PT dscuess with pt findings, reviewed goals, and possible benefit of pt having referral to urogyn for further assessment of laxity for possible pessary. Pt verbalized understanding but as she is still having increased urinary frequency and leakage of urine this referral might be helpful for additional information or recommendations. Pt requested PT to message referring provider about this, PT completed this today. As of time of this note, no response yet.   08/13/25: Nustep 6 mins L5 PT present to discuss progress Lumbar roll outs x10 rt/lt/ct Pelvic circles on disc x10 each Ant/post rocking with x15 with 5s isometrics with posterior rocks Tail wags x10 Piriformis stretch 3x30s Squats 10# x15>15# x10   PATIENT EDUCATION:  Education details: Interior and spatial designer Person educated: Patient Education method: Programmer, multimedia, Facilities manager, Actor cues, Verbal cues, and Handouts Education comprehension: verbalized understanding, returned demonstration, verbal  cues required, tactile cues required, and needs further education  HOME EXERCISE PROGRAM: Access Code: [email or web address] URL: [email or web address] Date: 07/24/2024 Prepared by: Darryle   Exercises - Supine Diaphragmatic Breathing  - 1 x daily - 7 x weekly - 1 sets - 10 reps - Supine Lower Trunk Rotation  - 1 x daily - 7 x weekly - 1 sets - 10 reps - Supported Teacher, music with Pelvic Floor Relaxation  - 1 x daily - 7 x weekly - 1 sets - 3 reps - 30s holds - Seated Overhead Reach Stretch  - 1 x daily - 7 x weekly - 1 sets - 10 reps - Hooklying Clamshell with Resistance  - 1 x daily - 7 x weekly - 2 sets - 10 reps - Sit to Stand with Pelvic Floor Contraction  - 1 x daily - 7 x weekly - 2 sets - 10 reps - pelvic floor contract and holds  - 1 x  daily - 7 x weekly - 2 sets - 10 reps - 5s holds - Seated Quick Flick Pelvic Floor Contractions  - 1 x daily - 7 x weekly - 2 sets - 10 reps - Seated Transversus Abdominis Bracing  - 1 x daily - 7 x weekly - 2 sets - 10 reps - Seated Shoulder Diagonal Pulls with Resistance  - 1 x daily - 7 x weekly - 2 sets - 10 reps - seated pelvic circles  - 1 x daily - 7 x weekly - 2 sets - 10 reps  ASSESSMENT:  CLINICAL IMPRESSION: Patient is a 73 y.o. female  who was seen today for physical therapy treatment for pelvic, abdominal, back pain, urinary incontinence, and increased urinary frequency. Continues to have back and tailbone pain, increased day time frequency and urinary incontinence, and ostomy outputs are not at her normal per pt. Pt reports HEP does help tailbone pain, has improvement in all sections of care but symptoms still present. Tolerated session well today does still benefit from cues for techniques.  Pt would benefit from additional PT to further address deficits.    OBJECTIVE IMPAIRMENTS: decreased activity tolerance, decreased coordination, decreased endurance, decreased mobility, difficulty walking, decreased strength, increased fascial restrictions, increased muscle spasms, impaired flexibility, improper body mechanics, postural dysfunction, and pain.   ACTIVITY LIMITATIONS: carrying, lifting, sitting, standing, transfers, continence, and locomotion level  PARTICIPATION LIMITATIONS: meal prep, cleaning, laundry, interpersonal relationship, community activity, and occupation  PERSONAL FACTORS: Fitness, Time since onset of injury/illness/exacerbation, and 1 comorbidity: medical history are also affecting patient's functional outcome.   REHAB POTENTIAL: Good  CLINICAL DECISION MAKING: Stable/uncomplicated  EVALUATION COMPLEXITY: Low   GOALS: Goals reviewed with patient? Yes  SHORT TERM GOALS: Target date: 07/14/24  Pt to be I with HEP for carry over and continuing recommendations  for improved outcomes.   Baseline: Goal status: MET  2.  Pt to be I with pressure management techniques to decreased stress at pelvic floor for improved tolerance to standing and walking without pelvic symptoms.  Baseline:  Goal status: MET  3.  Pt to be I with scar massage for improved abdominal tissue mobility to decreased urinary frequency to better tolerate traveling.  Baseline:  Goal status: MET - I with knowing how to do it but does forget to do it  4.  Pt will be independent with the knack, urge suppression technique, and double voiding in order to improve bladder habits and decrease urinary incontinence for improved skin integrity.  Baseline:  Goal status: MET  LONG TERM GOALS: Target date: 09/16/24  Pt to be I with advanced HEP for carry over and continuing recommendations for improved outcomes.   Baseline:  Goal status: ong oing  2.  Pt to demonstrate improved coordination of pelvic floor and breathing mechanics with 10# squat with appropriate synergistic patterns to decrease pain and leakage at least 75% of the time for improved ability to complete a 30 minute walking without strain at pelvic floor and symptoms.    Baseline:  Goal status: on going  3. Pt to demonstrate at least 4+/5 bil hip strength for improved pelvic stability and functional squats without leakage.  Baseline:  Goal status: on going  4.  Pt to demonstrate improved core strength with ability to complete sit up test 2/3 without pain or pelvic symptoms.  Baseline:  Goal status: on going  5.  Pt to report no more than one instance of urinary incontinence in a day for improved skin integrity and confidence to leave for errands.  Baseline:  Goal status: MET  6.  Pt to report no more than 3/10 pain at hip/pelvic complex due to improved mobility and strength of surrounding structures to tolerate at least 1 hour of standing and walking without worsening pain.  Baseline:  Goal status: groin pain resolved  but back and tailbone pain still present   PLAN:  PT FREQUENCY: 2x/week  PT DURATION: 20 sessions  PLANNED INTERVENTIONS: 97110-Therapeutic exercises, 97530- Therapeutic activity, 97112- Neuromuscular re-education, 97535- Self Care, 02859- Manual therapy, [phone]- Canalith repositioning, [id number]- Aquatic Therapy, [phone]- Electrical stimulation (manual), [id number]- Vasopneumatic device, [id number]- Ultrasound, [id number]- Traction (mechanical), [id number]- Ionotophoresis 4mg /ml Dexamethasone, [id number] (1-2 muscles), [id number] (3+ muscles)- Dry Needling, Patient/Family education, Balance training, Taping, Joint mobilization, Spinal mobilization, Scar mobilization, DME instructions, Cryotherapy, Moist heat, and Biofeedback  PLAN FOR NEXT SESSION: manual at abdomen, low back and glutes, core and hip strength, stretching hips and spine, coordination breathing and transverse abdominis activation activation    Darryle Navy, PT, DPT 08/13/2509:17 AM  [facility] [address], [address] [place], [place] [address] Phone # [phone] Fax [phone]

## 2024-08-21 ENCOUNTER — Ambulatory Visit: Admitting: Physical Therapy

## 2024-08-21 DIAGNOSIS — R279 Unspecified lack of coordination: Secondary | ICD-10-CM

## 2024-08-21 DIAGNOSIS — M6281 Muscle weakness (generalized): Secondary | ICD-10-CM

## 2024-08-21 DIAGNOSIS — R293 Abnormal posture: Secondary | ICD-10-CM

## 2024-08-21 NOTE — Therapy (Signed)
 OUTPATIENT PHYSICAL THERAPY FEMALE PELVIC TREATMENT   Patient Name: Cindy Dudley MRN: [id number] DOB:Feb 17, 1951, 73 y.o., female Today's Date: 08/21/2024   PT End of Session - 08/21/24 0931     Visit Number 10    Date for Recertification  11/05/24    Authorization Type [employer] medicare    Authorization Time Period [employer] Approved 4 Therapy Visit(s) from 08/12/2024 to 09/09/2024/ [id number]    Authorization - Visit Number 2    Authorization - Number of Visits 4    Progress Note Due on Visit 17    PT Start Time 0932    PT Stop Time 1012    PT Time Calculation (min) 40 min    Activity Tolerance Patient tolerated treatment well    Behavior During Therapy WFL for tasks assessed/performed          Past Medical History:  Diagnosis Date   Anemia    Asthma    Blood transfusion    Brain tumor (HCC) 1995   Bruises easily    Bursitis    left knee   Colitis, ulcerative (HCC) 1969   Fatigue    Fibroid tumor 1982   Hearing loss    Kidney stone    Ovarian cyst 1981   Rash    Sore throat    Spasmodic dysphonia 1982   Spasmodic torticollis 2007   Ulcer    Wears glasses    Past Surgical History:  Procedure Laterality Date   ABDOMINAL HYSTERECTOMY  1982   BRAIN TUMOR EXCISION  1995   CHOLECYSTECTOMY  2016   ILEOSTOMY  1982   LARYNX SURGERY  2000   OVARIAN CYST REMOVAL     Patient Active Problem List   Diagnosis Date Noted   Mid back pain 05/16/2024   Pelvic pain 05/16/2024   Ileostomy stenosis (HCC) 09/28/2023   Irritant contact dermatitis associated with fecal stoma 08/26/2023   Ileostomy care (HCC) 08/26/2023   Titubation 08/07/2022   Bilateral hearing loss 08/07/2022   Benign meningioma of brain (HCC) 09/18/2018   Hereditary essential tremor 09/18/2018   Thyroid  nodule, uninodular, left 10/17/2011    PCP: Antonio Cyndee Jamee JONELLE, DO   REFERRING PROVIDER: Antonio Cyndee Jamee JONELLE, DO   REFERRING DIAG: R10.2 (ICD-10-CM) - Pelvic pain  THERAPY DIAG:  Muscle weakness  (generalized)  Unspecified lack of coordination  Abnormal posture  Rationale for Evaluation and Treatment: Rehabilitation  ONSET DATE: chronic   SUBJECTIVE:  SUBJECTIVE STATEMENT: Pain and groin pain returned slightly after session last time, resolved after a couple days. Ordered air disk for home, this helps in general with sitting (same intensity but takes longer to occur and ramp up). Is able to do pelvic mobility exercises on disk well and this helps pain. HEP is still helping from pain increasing and if hurting helps lower  Does note same urinary incontinence with first morning void but others greatly improved.   32oz but limits to that to decreased urination  PAIN:  No pain 9/25  Are you having pain? Yes NPRS scale: 3/10 Pain location: mid back pain  Pain type: sharp Pain description: intermittent   Aggravating factors: sitting is constant, standing longer than 15 mins, housework, walking for 30 mins, changing positions Relieving factors: lying down flat  PRECAUTIONS: None  RED FLAGS: None   WEIGHT BEARING RESTRICTIONS: No  FALLS:  Has patient fallen in last 6 months? No  OCCUPATION: retired   ACTIVITY LEVEL : low  PLOF: Independent  PATIENT GOALS: to have no pain, wants to stand at least 1 hour without pain  PERTINENT HISTORY:  Coccyx pain,  pelvic floor myalgia, possibly related to nerve damage (per MD note) or surgery, multiple hernia, ostomy, ABDOMINAL HYSTERECTOMY,  Sexual abuse: No  BOWEL MOVEMENT: Uses ostomy since 79s and I  not concerns at this time  URINATION: Pain with urination: No Fully empty bladder: Yes:   Stream: Strong and Weak Urgency: No Frequency: every 30 mins if drinking water, limits due to this and can sometimes make it an hour - 1.5, 3x  night Leakage: Urge to void sometimes  Pads: No  INTERCOURSE:  Ability to have vaginal penetration Yes  Pain with intercourse: none DrynessYes  Climax: unable and reports decreased sensation  Marinoff Scale: 0/3  PREGNANCY: Vaginal deliveries 3  C-section deliveries 0 Currently pregnant No  PROLAPSE: None   OBJECTIVE:  Note: Objective measures were completed at Evaluation unless otherwise noted.  DIAGNOSTIC FINDINGS:    PATIENT SURVEYS:   PFIQ-7: 95 08/06/24: PFIQ-7: 52  COGNITION: Overall cognitive status: Within functional limits for tasks assessed     SENSATION: Light touch: Appears intact  LUMBAR SPECIAL TESTS:  SI Compression/distraction test: both increased pain at abdomen however back pain improved with compression  FUNCTIONAL TESTS:  Functional squats - unable to complete in proper mechanics, bil knee valgus, trunk flexion and decreased descent by 75%  Sit up test - 0/3 GAIT: Decreased cadence, trunk flexion, Rt rib rounding, decreased step height bil, decreased stride length  POSTURE: rounded shoulders, forward head, increased thoracic kyphosis, posterior pelvic tilt, and flexed trunk    LUMBARAROM/PROM:  A/PROM A/PROM  eval  Flexion WFL but with pain  Extension Limited by 25%  Right lateral flexion Limited by 50%  Left lateral flexion Limited by 50%  Right rotation Limited by 50%  Left rotation Limited by 50%   (Blank rows = not tested)  LOWER EXTREMITY ROM: Bil hamstrings and adductors limited by 50%  LOWER EXTREMITY MMT:  Bil hips grossly 3+/5 PALPATION:   General: tightness at bil thoracic and lumbar paraspinals, TTP at Lt SIJ, Lt gluteal and bil piriformis   Pelvic Alignment: Rt hip hike  Abdominal: TTP at mid quadrant, and lt mid quadrant; scar present throughout midline of abdomen and poor mobility throughout                 External Perineal Exam: dryness and redness present; most redness present at introitus  Internal Pelvic Floor: TTP throughout bil superficial and deep layers  Patient confirms identification and approves PT to assess internal pelvic floor and treatment Yes No emotional/communication barriers or cognitive limitation. Patient is motivated to learn. Patient understands and agrees with treatment goals and plan. PT explains patient will be examined in standing, sitting, and lying down to see how their muscles and joints work. When they are ready, they will be asked to remove their underwear so PT can examine their perineum. The patient is also given the option of providing their own chaperone as one is not provided in our facility. The patient also has the right and is explained the right to defer or refuse any part of the evaluation or treatment including the internal exam. With the patient's consent, PT will use one gloved finger to gently assess the muscles of the pelvic floor, seeing how well it contracts and relaxes and if there is muscle symmetry. After, the patient will get dressed and PT and patient will discuss exam findings and plan of care. PT and patient discuss plan of care, schedule, attendance policy and HEP activities.  PELVIC MMT:   MMT eval  Vaginal 1/5 - unable to complete consistently, often bearing down  Internal Anal Sphincter   External Anal Sphincter   Puborectalis   Diastasis Recti   (Blank rows = not tested)        TONE: Decreased   PROLAPSE: Possible anterior vaginal wall laxity present with coughing in hooklying   TODAY'S TREATMENT:                                                                                                                              DATE:   08/11/25: Patient consented to internal pelvic floor treatment vaginally this date and found to have similar strength, endurance, and coordination as to eval with demonstrations of pushing downward with attempts to contract. Extra time with NMRE with visualizations, muscle tapping for  improved muscle activations. Pt was able to stop with pushing and demonstrated 1/5 strength with contractions however with moderate cues. Pt does continue to demonstrate anterior vaginal wall laxity though doesn't appear worse than eval and pt denies pressure feeling.  Pt dressed with privacy, PT dscuess with pt findings, reviewed goals, and possible benefit of pt having referral to urogyn for further assessment of laxity for possible pessary. Pt verbalized understanding but as she is still having increased urinary frequency and leakage of urine this referral might be helpful for additional information or recommendations. Pt requested PT to message referring provider about this, PT completed this today. As of time of this note, no response yet.   08/13/25: Nustep 6 mins L5 PT present to discuss progress Lumbar roll outs x10 rt/lt/ct Pelvic circles on disc x10 each Ant/post rocking with x15 with 5s isometrics with posterior rocks Tail wags x10 Piriformis stretch 3x30s Squats 10# x15>15# x10  08/21/24: Pt had questions about her referring provider's referral -  pt checked chart and educated pt on PT's did refer to urgogyn/urology and pt had several questions these providers and PT encouraged pt to reach out to that office or Dr. Antonio Meth about these Tail wags x20 Ant/post quad rocking x20 Open books x10 each 4# unilateral chest press with opposite hand/knee press x10 each X10 hooklying pelvic tilts with exhale X15 sit to stands 10#  PATIENT EDUCATION:  Education details: Interior and spatial designer Person educated: Patient Education method: Programmer, multimedia, Facilities manager, Actor cues, Verbal cues, and Handouts Education comprehension: verbalized understanding, returned demonstration, verbal cues required, tactile cues required, and needs further education  HOME EXERCISE PROGRAM: Access Code: [email or web address] URL: [email or web address] Date: 07/24/2024 Prepared by: Darryle   Exercises - Supine  Diaphragmatic Breathing  - 1 x daily - 7 x weekly - 1 sets - 10 reps - Supine Lower Trunk Rotation  - 1 x daily - 7 x weekly - 1 sets - 10 reps - Supported Teacher, music with Pelvic Floor Relaxation  - 1 x daily - 7 x weekly - 1 sets - 3 reps - 30s holds - Seated Overhead Reach Stretch  - 1 x daily - 7 x weekly - 1 sets - 10 reps - Hooklying Clamshell with Resistance  - 1 x daily - 7 x weekly - 2 sets - 10 reps - Sit to Stand with Pelvic Floor Contraction  - 1 x daily - 7 x weekly - 2 sets - 10 reps - pelvic floor contract and holds  - 1 x daily - 7 x weekly - 2 sets - 10 reps - 5s holds - Seated Quick Flick Pelvic Floor Contractions  - 1 x daily - 7 x weekly - 2 sets - 10 reps - Seated Transversus Abdominis Bracing  - 1 x daily - 7 x weekly - 2 sets - 10 reps - Seated Shoulder Diagonal Pulls with Resistance  - 1 x daily - 7 x weekly - 2 sets - 10 reps - seated pelvic circles  - 1 x daily - 7 x weekly - 2 sets - 10 reps  ASSESSMENT:  CLINICAL IMPRESSION: Patient is a 73 y.o. female  who was seen today for physical therapy treatment for pelvic, abdominal, back pain, urinary incontinence, and increased urinary frequency. Continues to have back and tailbone pain, increased day time frequency and urinary incontinence, and ostomy outputs are not at her normal per pt but does report at least 50-60% improvement in total symptoms.  Tolerated session well today does still benefit from cues for techniques.  Pt would benefit from additional PT to further address deficits.    OBJECTIVE IMPAIRMENTS: decreased activity tolerance, decreased coordination, decreased endurance, decreased mobility, difficulty walking, decreased strength, increased fascial restrictions, increased muscle spasms, impaired flexibility, improper body mechanics, postural dysfunction, and pain.   ACTIVITY LIMITATIONS: carrying, lifting, sitting, standing, transfers, continence, and locomotion level  PARTICIPATION LIMITATIONS: meal  prep, cleaning, laundry, interpersonal relationship, community activity, and occupation  PERSONAL FACTORS: Fitness, Time since onset of injury/illness/exacerbation, and 1 comorbidity: medical history are also affecting patient's functional outcome.   REHAB POTENTIAL: Good  CLINICAL DECISION MAKING: Stable/uncomplicated  EVALUATION COMPLEXITY: Low   GOALS: Goals reviewed with patient? Yes  SHORT TERM GOALS: Target date: 07/14/24  Pt to be I with HEP for carry over and continuing recommendations for improved outcomes.   Baseline: Goal status: MET  2.  Pt to be I with pressure management techniques to decreased stress at pelvic floor for improved tolerance to standing and walking without  pelvic symptoms.  Baseline:  Goal status: MET  3.  Pt to be I with scar massage for improved abdominal tissue mobility to decreased urinary frequency to better tolerate traveling.  Baseline:  Goal status: MET - I with knowing how to do it but does forget to do it  4.  Pt will be independent with the knack, urge suppression technique, and double voiding in order to improve bladder habits and decrease urinary incontinence for improved skin integrity.  Baseline:  Goal status: MET   LONG TERM GOALS: Target date: 09/16/24  Pt to be I with advanced HEP for carry over and continuing recommendations for improved outcomes.   Baseline:  Goal status: ong oing  2.  Pt to demonstrate improved coordination of pelvic floor and breathing mechanics with 10# squat with appropriate synergistic patterns to decrease pain and leakage at least 75% of the time for improved ability to complete a 30 minute walking without strain at pelvic floor and symptoms.    Baseline:  Goal status: on going  3. Pt to demonstrate at least 4+/5 bil hip strength for improved pelvic stability and functional squats without leakage.  Baseline:  Goal status: on going  4.  Pt to demonstrate improved core strength with ability to complete  sit up test 2/3 without pain or pelvic symptoms.  Baseline:  Goal status: on going  5.  Pt to report no more than one instance of urinary incontinence in a day for improved skin integrity and confidence to leave for errands.  Baseline:  Goal status: MET  6.  Pt to report no more than 3/10 pain at hip/pelvic complex due to improved mobility and strength of surrounding structures to tolerate at least 1 hour of standing and walking without worsening pain.  Baseline:  Goal status: groin pain resolved but back and tailbone pain still present   PLAN:  PT FREQUENCY: 2x/week  PT DURATION: 20 sessions  PLANNED INTERVENTIONS: 97110-Therapeutic exercises, 97530- Therapeutic activity, 97112- Neuromuscular re-education, 97535- Self Care, 02859- Manual therapy, [phone]- Canalith repositioning, [id number]- Aquatic Therapy, [phone]- Electrical stimulation (manual), [id number]- Vasopneumatic device, [id number]- Ultrasound, [id number]- Traction (mechanical), [id number]- Ionotophoresis 4mg /ml Dexamethasone, 79439 (1-2 muscles), 20561 (3+ muscles)- Dry Needling, Patient/Family education, Balance training, Taping, Joint mobilization, Spinal mobilization, Scar mobilization, DME instructions, Cryotherapy, Moist heat, and Biofeedback  PLAN FOR NEXT SESSION: manual at abdomen, low back and glutes, core and hip strength, stretching hips and spine, coordination breathing and transverse abdominis activation activation    Darryle Navy, PT, DPT 08/21/2509:16 AM  [facility] [address] [place], [place] [address] Phone # [phone] Fax [phone]

## 2024-08-22 ENCOUNTER — Telehealth: Payer: Self-pay

## 2024-08-22 ENCOUNTER — Telehealth: Payer: Self-pay | Admitting: Family Medicine

## 2024-08-22 NOTE — Telephone Encounter (Signed)
 Orders for the x-rays refaxed.

## 2024-08-22 NOTE — Telephone Encounter (Signed)
 Copied from CRM #[id number]. Topic: Clinical - Request for Lab/Test Order >> Aug 22, 2024 10:51 AM Tinnie C wrote: Reason for CRM: Patient has two x-rays ordered internal. She says that imaging w/ [facility] was too short handed and it took a month or more for her to get her results last time. She requests that we send her x-ray order to [facility]. Please give her a call once complete [phone]

## 2024-08-22 NOTE — Telephone Encounter (Signed)
 Orders faxed to [facility] at [phone]. LMOM informing Pt that orders have been faxed.

## 2024-08-22 NOTE — Telephone Encounter (Unsigned)
 Copied from CRM [id number]. Topic: Clinical - Request for Lab/Test Order >> Aug 22, 2024  2:01 PM Burnard DEL wrote: Reason for RMF:Ujapuyj from  [facility] called sating that they received orders but they are needing clarification on what is needed?What kind of testing,CT,XRAY,mri,etc.?

## 2024-08-22 NOTE — Telephone Encounter (Signed)
 Copied from CRM #[id number]. Topic: Referral - Question >> Aug 22, 2024 10:49 AM [name] wrote: Reason for CRM: Pt calling to let us  know that she is scheduled with Dr. Comer Drone in urology, but she called their front office and they say they never received the referral. Please make sure [facility] has referral.

## 2024-08-27 ENCOUNTER — Ambulatory Visit: Attending: Family Medicine | Admitting: Physical Therapy

## 2024-08-27 DIAGNOSIS — M62838 Other muscle spasm: Secondary | ICD-10-CM | POA: Insufficient documentation

## 2024-08-27 DIAGNOSIS — R279 Unspecified lack of coordination: Secondary | ICD-10-CM | POA: Insufficient documentation

## 2024-08-27 DIAGNOSIS — M6281 Muscle weakness (generalized): Secondary | ICD-10-CM | POA: Diagnosis present

## 2024-08-27 DIAGNOSIS — R293 Abnormal posture: Secondary | ICD-10-CM | POA: Diagnosis present

## 2024-08-27 NOTE — Therapy (Signed)
 OUTPATIENT PHYSICAL THERAPY FEMALE PELVIC TREATMENT   Patient Name: Cindy Dudley MRN: [id number] DOB:1951-05-13, 73 y.o., female Today's Date: 08/27/2024   PT End of Session - 08/27/24 0938     Visit Number 11    Date for Recertification  11/05/24    Authorization Type [employer] medicare    Authorization Time Period [employer] Approved 4 Therapy Visit(s) from 08/12/2024 to 09/09/2024/ [id number]    Authorization - Visit Number 3    Authorization - Number of Visits 4    Progress Note Due on Visit 17    PT Start Time 0932    PT Stop Time 1013    PT Time Calculation (min) 41 min    Activity Tolerance Patient tolerated treatment well    Behavior During Therapy WFL for tasks assessed/performed          Past Medical History:  Diagnosis Date   Anemia    Asthma    Blood transfusion    Brain tumor (HCC) 1995   Bruises easily    Bursitis    left knee   Colitis, ulcerative (HCC) 1969   Fatigue    Fibroid tumor 1982   Hearing loss    Kidney stone    Ovarian cyst 1981   Rash    Sore throat    Spasmodic dysphonia 1982   Spasmodic torticollis 2007   Ulcer    Wears glasses    Past Surgical History:  Procedure Laterality Date   ABDOMINAL HYSTERECTOMY  1982   BRAIN TUMOR EXCISION  1995   CHOLECYSTECTOMY  2016   ILEOSTOMY  1982   LARYNX SURGERY  2000   OVARIAN CYST REMOVAL     Patient Active Problem List   Diagnosis Date Noted   Mid back pain 05/16/2024   Pelvic pain 05/16/2024   Ileostomy stenosis (HCC) 09/28/2023   Irritant contact dermatitis associated with fecal stoma 08/26/2023   Ileostomy care (HCC) 08/26/2023   Titubation 08/07/2022   Bilateral hearing loss 08/07/2022   Benign meningioma of brain (HCC) 09/18/2018   Hereditary essential tremor 09/18/2018   Thyroid  nodule, uninodular, left 10/17/2011    PCP: Antonio Cyndee Jamee JONELLE, DO   REFERRING PROVIDER: Antonio Cyndee Jamee JONELLE, DO   REFERRING DIAG: R10.2 (ICD-10-CM) - Pelvic pain  THERAPY DIAG:  Muscle weakness  (generalized)  Unspecified lack of coordination  Abnormal posture  Rationale for Evaluation and Treatment: Rehabilitation  ONSET DATE: chronic   SUBJECTIVE:  SUBJECTIVE STATEMENT: Had x rays recently still waiting on results. Reports lying on Lt hip has made hip pain start and lt groin pain has been present with more sitting doing more sitting recently.   Does note same urinary incontinence with first morning void but others greatly improved.   32oz but limits to that to decreased urination  PAIN:  No pain 08/27/24  Are you having pain? Yes NPRS scale: 3/10 Pain location: mid back pain  Pain type: sharp Pain description: intermittent   Aggravating factors: sitting is constant, standing longer than 15 mins, housework, walking for 30 mins, changing positions Relieving factors: lying down flat  PRECAUTIONS: None  RED FLAGS: None   WEIGHT BEARING RESTRICTIONS: No  FALLS:  Has patient fallen in last 6 months? No  OCCUPATION: retired   ACTIVITY LEVEL : low  PLOF: Independent  PATIENT GOALS: to have no pain, wants to stand at least 1 hour without pain  PERTINENT HISTORY:  Coccyx pain,  pelvic floor myalgia, possibly related to nerve damage (per MD note) or surgery, multiple hernia, ostomy, ABDOMINAL HYSTERECTOMY,  Sexual abuse: No  BOWEL MOVEMENT: Uses ostomy since 70s and I  not concerns at this time  URINATION: Pain with urination: No Fully empty bladder: Yes:   Stream: Strong and Weak Urgency: No Frequency: every 30 mins if drinking water, limits due to this and can sometimes make it an hour - 1.5, 3x night Leakage: Urge to void sometimes  Pads: No  INTERCOURSE:  Ability to have vaginal penetration Yes  Pain with intercourse: none DrynessYes  Climax: unable and reports  decreased sensation  Marinoff Scale: 0/3  PREGNANCY: Vaginal deliveries 3  C-section deliveries 0 Currently pregnant No  PROLAPSE: None   OBJECTIVE:  Note: Objective measures were completed at Evaluation unless otherwise noted.  DIAGNOSTIC FINDINGS:    PATIENT SURVEYS:   PFIQ-7: 95 08/06/24: PFIQ-7: 52  COGNITION: Overall cognitive status: Within functional limits for tasks assessed     SENSATION: Light touch: Appears intact  LUMBAR SPECIAL TESTS:  SI Compression/distraction test: both increased pain at abdomen however back pain improved with compression  FUNCTIONAL TESTS:  Functional squats - unable to complete in proper mechanics, bil knee valgus, trunk flexion and decreased descent by 75%  Sit up test - 0/3 GAIT: Decreased cadence, trunk flexion, Rt rib rounding, decreased step height bil, decreased stride length  POSTURE: rounded shoulders, forward head, increased thoracic kyphosis, posterior pelvic tilt, and flexed trunk    LUMBARAROM/PROM:  A/PROM A/PROM  eval  Flexion WFL but with pain  Extension Limited by 25%  Right lateral flexion Limited by 50%  Left lateral flexion Limited by 50%  Right rotation Limited by 50%  Left rotation Limited by 50%   (Blank rows = not tested)  LOWER EXTREMITY ROM: Bil hamstrings and adductors limited by 50%  LOWER EXTREMITY MMT:  Bil hips grossly 3+/5 PALPATION:   General: tightness at bil thoracic and lumbar paraspinals, TTP at Lt SIJ, Lt gluteal and bil piriformis   Pelvic Alignment: Rt hip hike  Abdominal: TTP at mid quadrant, and lt mid quadrant; scar present throughout midline of abdomen and poor mobility throughout                 External Perineal Exam: dryness and redness present; most redness present at introitus                             Internal  Pelvic Floor: TTP throughout bil superficial and deep layers  Patient confirms identification and approves PT to assess internal pelvic floor and treatment  Yes No emotional/communication barriers or cognitive limitation. Patient is motivated to learn. Patient understands and agrees with treatment goals and plan. PT explains patient will be examined in standing, sitting, and lying down to see how their muscles and joints work. When they are ready, they will be asked to remove their underwear so PT can examine their perineum. The patient is also given the option of providing their own chaperone as one is not provided in our facility. The patient also has the right and is explained the right to defer or refuse any part of the evaluation or treatment including the internal exam. With the patient's consent, PT will use one gloved finger to gently assess the muscles of the pelvic floor, seeing how well it contracts and relaxes and if there is muscle symmetry. After, the patient will get dressed and PT and patient will discuss exam findings and plan of care. PT and patient discuss plan of care, schedule, attendance policy and HEP activities.  PELVIC MMT:   MMT eval  Vaginal 1/5 - unable to complete consistently, often bearing down  Internal Anal Sphincter   External Anal Sphincter   Puborectalis   Diastasis Recti   (Blank rows = not tested)        TONE: Decreased   PROLAPSE: Possible anterior vaginal wall laxity present with coughing in hooklying   TODAY'S TREATMENT:                                                                                                                              DATE:   08/11/25: Patient consented to internal pelvic floor treatment vaginally this date and found to have similar strength, endurance, and coordination as to eval with demonstrations of pushing downward with attempts to contract. Extra time with NMRE with visualizations, muscle tapping for improved muscle activations. Pt was able to stop with pushing and demonstrated 1/5 strength with contractions however with moderate cues. Pt does continue to demonstrate anterior  vaginal wall laxity though doesn't appear worse than eval and pt denies pressure feeling.  Pt dressed with privacy, PT dscuess with pt findings, reviewed goals, and possible benefit of pt having referral to urogyn for further assessment of laxity for possible pessary. Pt verbalized understanding but as she is still having increased urinary frequency and leakage of urine this referral might be helpful for additional information or recommendations. Pt requested PT to message referring provider about this, PT completed this today. As of time of this note, no response yet.   08/13/25: Nustep 6 mins L5 PT present to discuss progress Lumbar roll outs x10 rt/lt/ct Pelvic circles on disc x10 each Ant/post rocking with x15 with 5s isometrics with posterior rocks Tail wags x10 Piriformis stretch 3x30s Squats 10# x15>15# x10  08/21/24: Pt had questions about her referring provider's referral -  pt checked chart and educated pt on PT's did refer to urgogyn/urology and pt had several questions these providers and PT encouraged pt to reach out to that office or Dr. Antonio Meth about these Tail wags x20 Ant/post quad rocking x20 Open books x10 each 4# unilateral chest press with opposite hand/knee press x10 each X10 hooklying pelvic tilts with exhale X15 sit to stands 10#  08/27/24 Nustep 6 mins L5 PT present to discuss progress Seated cat/cows x10 Over head reach stretch with diaphragmatic breathing x5 Angel wing stretch 3x15s seated Shoulder rolls x10 back Lateral trunk stretch 2x30s rt  and lt Pelvic tilts x10 seated Seated marches 4# each 2x10 each with pelvic floor activation with each march and exhale  Seated 4# wide stepping 2x10 each Seated 4# ER x10 each X10 8# sit to stands  Farmer's carry 10# 750' each hand  PATIENT EDUCATION:  Education details: Interior and spatial designer Person educated: Patient Education method: Explanation, Demonstration, Tactile cues, Verbal cues, and Handouts Education  comprehension: verbalized understanding, returned demonstration, verbal cues required, tactile cues required, and needs further education  HOME EXERCISE PROGRAM: Access Code: [email or web address] URL: [email or web address] Date: 08/27/2024 Prepared by: Darryle  Exercises - Supine Diaphragmatic Breathing  - 1 x daily - 7 x weekly - 1 sets - 10 reps - Supine Lower Trunk Rotation  - 1 x daily - 7 x weekly - 1 sets - 10 reps - Supported Teacher, music with Pelvic Floor Relaxation  - 1 x daily - 7 x weekly - 1 sets - 3 reps - 30s holds - Seated Overhead Reach Stretch  - 1 x daily - 7 x weekly - 1 sets - 10 reps - Hooklying Clamshell with Resistance  - 1 x daily - 7 x weekly - 2 sets - 10 reps - Sit to Stand with Pelvic Floor Contraction  - 1 x daily - 7 x weekly - 2 sets - 10 reps - pelvic floor contract and holds  - 1 x daily - 7 x weekly - 2 sets - 10 reps - 5s holds - Seated Quick Flick Pelvic Floor Contractions  - 1 x daily - 7 x weekly - 2 sets - 10 reps - Seated Transversus Abdominis Bracing  - 1 x daily - 7 x weekly - 2 sets - 10 reps - Seated Shoulder Diagonal Pulls with Resistance  - 1 x daily - 7 x weekly - 2 sets - 10 reps - seated pelvic circles  - 1 x daily - 7 x weekly - 2 sets - 10 reps - Tail Wag  - 1 x daily - 7 x weekly - 1 sets - 10 reps - Hip Hinge Rock Back  - 1 x daily - 7 x weekly - 1 sets - 10 reps - Supine Bridge  - 1 x daily - 7 x weekly - 1 sets - 10 reps - overhead press  - 1 x daily - 7 x weekly - 1 sets - 10 reps - Seated Hip Flexion and External Rotation with Ankle Weight  - 1 x daily - 7 x weekly - 1 sets - 10 reps - Seated Hip Flexion March with Ankle Weights  - 1 x daily - 7 x weekly - 1 sets - 10 reps - Seated Cat Cow  - 1 x daily - 7 x weekly - 1 sets - 10 reps - Standing Row with Anchored Resistance  - 1 x daily - 7 x weekly - 1 sets - 10 reps  ASSESSMENT:  CLINICAL IMPRESSION: Patient is a 73 y.o. female  who was seen today for physical therapy  treatment for pelvic, abdominal, back pain, urinary incontinence, and increased urinary frequency. Pt reports she did have pain after positions needed for x rays but these have resolved. Pt does report continued tailbone and groin pain if she sits too long. Has appointment with urogyn for January set up. Pt tolerated well today denied worsening pain and no  leakage during session. Plans for DC at next appointment. Pt would benefit from additional PT to further address deficits.    OBJECTIVE IMPAIRMENTS: decreased activity tolerance, decreased coordination, decreased endurance, decreased mobility, difficulty walking, decreased strength, increased fascial restrictions, increased muscle spasms, impaired flexibility, improper body mechanics, postural dysfunction, and pain.   ACTIVITY LIMITATIONS: carrying, lifting, sitting, standing, transfers, continence, and locomotion level  PARTICIPATION LIMITATIONS: meal prep, cleaning, laundry, interpersonal relationship, community activity, and occupation  PERSONAL FACTORS: Fitness, Time since onset of injury/illness/exacerbation, and 1 comorbidity: medical history are also affecting patient's functional outcome.   REHAB POTENTIAL: Good  CLINICAL DECISION MAKING: Stable/uncomplicated  EVALUATION COMPLEXITY: Low   GOALS: Goals reviewed with patient? Yes  SHORT TERM GOALS: Target date: 07/14/24  Pt to be I with HEP for carry over and continuing recommendations for improved outcomes.   Baseline: Goal status: MET  2.  Pt to be I with pressure management techniques to decreased stress at pelvic floor for improved tolerance to standing and walking without pelvic symptoms.  Baseline:  Goal status: MET  3.  Pt to be I with scar massage for improved abdominal tissue mobility to decreased urinary frequency to better tolerate traveling.  Baseline:  Goal status: MET - I with knowing how to do it but does forget to do it  4.  Pt will be independent with the  knack, urge suppression technique, and double voiding in order to improve bladder habits and decrease urinary incontinence for improved skin integrity.  Baseline:  Goal status: MET   LONG TERM GOALS: Target date: 09/16/24  Pt to be I with advanced HEP for carry over and continuing recommendations for improved outcomes.   Baseline:  Goal status: ong oing  2.  Pt to demonstrate improved coordination of pelvic floor and breathing mechanics with 10# squat with appropriate synergistic patterns to decrease pain and leakage at least 75% of the time for improved ability to complete a 30 minute walking without strain at pelvic floor and symptoms.    Baseline:  Goal status: on going  3. Pt to demonstrate at least 4+/5 bil hip strength for improved pelvic stability and functional squats without leakage.  Baseline:  Goal status: on going  4.  Pt to demonstrate improved core strength with ability to complete sit up test 2/3 without pain or pelvic symptoms.  Baseline:  Goal status: on going  5.  Pt to report no more than one instance of urinary incontinence in a day for improved skin integrity and confidence to leave for errands.  Baseline:  Goal status: MET  6.  Pt to report no more than 3/10 pain at hip/pelvic complex due to improved mobility and strength of surrounding structures to tolerate at least 1 hour of standing and walking without worsening pain.  Baseline:  Goal status: groin pain resolved but back and tailbone pain still present   PLAN:  PT FREQUENCY: 2x/week  PT DURATION: 20 sessions  PLANNED INTERVENTIONS: 97110-Therapeutic exercises, 97530- Therapeutic activity, [id number]- Neuromuscular re-education, 97535- Self Care, 02859- Manual therapy, [phone]-  Canalith repositioning, 02886- Aquatic Therapy, [id number]- Electrical stimulation (manual), [id number]- Vasopneumatic device, [id number]- Ultrasound, [id number]- Traction (mechanical), [id number]- Ionotophoresis 4mg /ml Dexamethasone, 20560 (1-2 muscles), 20561  (3+ muscles)- Dry Needling, Patient/Family education, Balance training, Taping, Joint mobilization, Spinal mobilization, Scar mobilization, DME instructions, Cryotherapy, Moist heat, and Biofeedback  PLAN FOR NEXT SESSION: manual at abdomen, low back and glutes, core and hip strength, stretching hips and spine, coordination breathing and transverse abdominis activation activation    Darryle Navy, PT, DPT 08/27/2509:16 AM  [facility] [address] [place], [place] [address] Phone # [phone] Fax [phone]

## 2024-08-28 ENCOUNTER — Ambulatory Visit: Admitting: Physical Therapy

## 2024-08-28 DIAGNOSIS — M62838 Other muscle spasm: Secondary | ICD-10-CM

## 2024-08-28 DIAGNOSIS — M6281 Muscle weakness (generalized): Secondary | ICD-10-CM

## 2024-08-28 DIAGNOSIS — R293 Abnormal posture: Secondary | ICD-10-CM

## 2024-08-28 DIAGNOSIS — R279 Unspecified lack of coordination: Secondary | ICD-10-CM

## 2024-08-28 NOTE — Therapy (Signed)
 OUTPATIENT PHYSICAL THERAPY FEMALE PELVIC TREATMENT   Patient Name: Cindy Dudley MRN: [id number] DOB:06/18/1951, 73 y.o., female Today's Date: 08/28/2024   PT End of Session - 08/28/24 1533     Visit Number 12    Date for Recertification  11/05/24    Authorization Type [employer] medicare    Authorization Time Period [employer] Approved 4 Therapy Visit(s) from 08/12/2024 to 09/09/2024/ [id number]    Authorization - Visit Number 4    Authorization - Number of Visits 4    Progress Note Due on Visit 17    PT Start Time 1531    PT Stop Time 1613    PT Time Calculation (min) 42 min    Activity Tolerance Patient tolerated treatment well    Behavior During Therapy WFL for tasks assessed/performed          Past Medical History:  Diagnosis Date   Anemia    Asthma    Blood transfusion    Brain tumor (HCC) 1995   Bruises easily    Bursitis    left knee   Colitis, ulcerative (HCC) 1969   Fatigue    Fibroid tumor 1982   Hearing loss    Kidney stone    Ovarian cyst 1981   Rash    Sore throat    Spasmodic dysphonia 1982   Spasmodic torticollis 2007   Ulcer    Wears glasses    Past Surgical History:  Procedure Laterality Date   ABDOMINAL HYSTERECTOMY  1982   BRAIN TUMOR EXCISION  1995   CHOLECYSTECTOMY  2016   ILEOSTOMY  1982   LARYNX SURGERY  2000   OVARIAN CYST REMOVAL     Patient Active Problem List   Diagnosis Date Noted   Mid back pain 05/16/2024   Pelvic pain 05/16/2024   Ileostomy stenosis (HCC) 09/28/2023   Irritant contact dermatitis associated with fecal stoma 08/26/2023   Ileostomy care (HCC) 08/26/2023   Titubation 08/07/2022   Bilateral hearing loss 08/07/2022   Benign meningioma of brain (HCC) 09/18/2018   Hereditary essential tremor 09/18/2018   Thyroid  nodule, uninodular, left 10/17/2011    PCP: Antonio Cyndee Jamee JONELLE, DO   REFERRING PROVIDER: Antonio Cyndee Jamee JONELLE, DO   REFERRING DIAG: R10.2 (ICD-10-CM) - Pelvic pain  THERAPY DIAG:  Muscle weakness  (generalized)  Unspecified lack of coordination  Abnormal posture  Other muscle spasm  Rationale for Evaluation and Treatment: Rehabilitation  ONSET DATE: chronic   SUBJECTIVE:  SUBJECTIVE STATEMENT: Pt reports her back and groin are back to bothering her. Having difficulty doing housework now.   Does note same urinary incontinence with first morning void but others greatly improved.   32oz but limits to that to decreased urination  PAIN:   Are you having pain? Yes NPRS scale: 7/10 groin; 9/10 back Pain location: mid back pain  Pain type: sharp Pain description: intermittent   Aggravating factors: sitting is constant, standing longer than 15 mins, housework, walking for 30 mins, changing positions Relieving factors: lying down flat  PRECAUTIONS: None  RED FLAGS: None   WEIGHT BEARING RESTRICTIONS: No  FALLS:  Has patient fallen in last 6 months? No  OCCUPATION: retired   ACTIVITY LEVEL : low  PLOF: Independent  PATIENT GOALS: to have no pain, wants to stand at least 1 hour without pain  PERTINENT HISTORY:  Coccyx pain,  pelvic floor myalgia, possibly related to nerve damage (per MD note) or surgery, multiple hernia, ostomy, ABDOMINAL HYSTERECTOMY,  Sexual abuse: No  BOWEL MOVEMENT: Uses ostomy since 22s and I  not concerns at this time  URINATION: Pain with urination: No Fully empty bladder: Yes:   Stream: Strong and Weak Urgency: No Frequency: every 30 mins if drinking water, limits due to this and can sometimes make it an hour - 1.5, 3x night Leakage: Urge to void sometimes  Pads: No  INTERCOURSE:  Ability to have vaginal penetration Yes  Pain with intercourse: none DrynessYes  Climax: unable and reports decreased sensation  Marinoff Scale:  0/3  PREGNANCY: Vaginal deliveries 3  C-section deliveries 0 Currently pregnant No  PROLAPSE: None   OBJECTIVE:  Note: Objective measures were completed at Evaluation unless otherwise noted.  DIAGNOSTIC FINDINGS:    PATIENT SURVEYS:   PFIQ-7: 95 08/06/24: PFIQ-7: 52 08/28/24: PFIQ-7: 48  COGNITION: Overall cognitive status: Within functional limits for tasks assessed     SENSATION: Light touch: Appears intact  LUMBAR SPECIAL TESTS:  SI Compression/distraction test: both increased pain at abdomen however back pain improved with compression  FUNCTIONAL TESTS:  Functional squats - unable to complete in proper mechanics, bil knee valgus, trunk flexion and decreased descent by 75%  Sit up test - 0/3 GAIT: Decreased cadence, trunk flexion, Rt rib rounding, decreased step height bil, decreased stride length  POSTURE: rounded shoulders, forward head, increased thoracic kyphosis, posterior pelvic tilt, and flexed trunk    LUMBARAROM/PROM:  A/PROM A/PROM  eval  Flexion WFL but with pain  Extension Limited by 25%  Right lateral flexion Limited by 50%  Left lateral flexion Limited by 50%  Right rotation Limited by 50%  Left rotation Limited by 50%   (Blank rows = not tested)  LOWER EXTREMITY ROM: Bil hamstrings and adductors limited by 50%  LOWER EXTREMITY MMT:  Bil hips grossly 3+/5 PALPATION:   General: tightness at bil thoracic and lumbar paraspinals, TTP at Lt SIJ, Lt gluteal and bil piriformis   Pelvic Alignment: Rt hip hike  Abdominal: TTP at mid quadrant, and lt mid quadrant; scar present throughout midline of abdomen and poor mobility throughout                 External Perineal Exam: dryness and redness present; most redness present at introitus                             Internal Pelvic Floor: TTP throughout bil superficial and deep layers  Patient confirms identification  and approves PT to assess internal pelvic floor and treatment Yes No  emotional/communication barriers or cognitive limitation. Patient is motivated to learn. Patient understands and agrees with treatment goals and plan. PT explains patient will be examined in standing, sitting, and lying down to see how their muscles and joints work. When they are ready, they will be asked to remove their underwear so PT can examine their perineum. The patient is also given the option of providing their own chaperone as one is not provided in our facility. The patient also has the right and is explained the right to defer or refuse any part of the evaluation or treatment including the internal exam. With the patient's consent, PT will use one gloved finger to gently assess the muscles of the pelvic floor, seeing how well it contracts and relaxes and if there is muscle symmetry. After, the patient will get dressed and PT and patient will discuss exam findings and plan of care. PT and patient discuss plan of care, schedule, attendance policy and HEP activities.  PELVIC MMT:   MMT eval 08/11/25:  Vaginal 1/5 - unable to complete consistently, often bearing down 1/5, with much better techniques   Internal Anal Sphincter    External Anal Sphincter    Puborectalis    Diastasis Recti    (Blank rows = not tested)        TONE: Decreased   PROLAPSE: Possible anterior vaginal wall laxity present with coughing in hooklying   TODAY'S TREATMENT:                                                                                                                              DATE:    08/21/24: Pt had questions about her referring provider's referral - pt checked chart and educated pt on PT's did refer to urgogyn/urology and pt had several questions these providers and PT encouraged pt to reach out to that office or Dr. Antonio Meth about these Tail wags x20 Ant/post quad rocking x20 Open books x10 each 4# unilateral chest press with opposite hand/knee press x10 each X10 hooklying pelvic tilts  with exhale X15 sit to stands 10#  08/27/24 Nustep 6 mins L5 PT present to discuss progress Seated cat/cows x10 Over head reach stretch with diaphragmatic breathing x5 Angel wing stretch 3x15s seated Shoulder rolls x10 back Lateral trunk stretch 2x30s rt  and lt Pelvic tilts x10 seated Seated marches 4# each 2x10 each with pelvic floor activation with each march and exhale  Seated 4# wide stepping 2x10 each Seated 4# ER x10 each X10 8# sit to stands  Farmer's carry 10# 750' each hand  08/28/24: Cat cow x10 Tail wags x10 Blue band hip abduction in hooklying 2x10 Blue band hip flexion in hooklying 2x10 Lumbar roll outs x10 rt/lt/ct 1/2 foam roller thoracic extension 30s, x10 wide claps Hip flexor stretch bil 3x30s Reverse clams x15 + 30s hold on last rep each side Pelvic tilts  on air disc 2x10 Hip shift x15 + 30s hold  PATIENT EDUCATION:  Education details: Interior and spatial designer Person educated: Patient Education method: Programmer, multimedia, Demonstration, Actor cues, Verbal cues, and Handouts Education comprehension: verbalized understanding, returned demonstration, verbal cues required, tactile cues required, and needs further education  HOME EXERCISE PROGRAM: Access Code: [email or web address] URL: [email or web address] Date: 08/27/2024 Prepared by: Darryle  Exercises - Supine Diaphragmatic Breathing  - 1 x daily - 7 x weekly - 1 sets - 10 reps - Supine Lower Trunk Rotation  - 1 x daily - 7 x weekly - 1 sets - 10 reps - Supported Teacher, music with Pelvic Floor Relaxation  - 1 x daily - 7 x weekly - 1 sets - 3 reps - 30s holds - Seated Overhead Reach Stretch  - 1 x daily - 7 x weekly - 1 sets - 10 reps - Hooklying Clamshell with Resistance  - 1 x daily - 7 x weekly - 2 sets - 10 reps - Sit to Stand with Pelvic Floor Contraction  - 1 x daily - 7 x weekly - 2 sets - 10 reps - pelvic floor contract and holds  - 1 x daily - 7 x weekly - 2 sets - 10 reps - 5s holds - Seated Quick Flick  Pelvic Floor Contractions  - 1 x daily - 7 x weekly - 2 sets - 10 reps - Seated Transversus Abdominis Bracing  - 1 x daily - 7 x weekly - 2 sets - 10 reps - Seated Shoulder Diagonal Pulls with Resistance  - 1 x daily - 7 x weekly - 2 sets - 10 reps - seated pelvic circles  - 1 x daily - 7 x weekly - 2 sets - 10 reps - Tail Wag  - 1 x daily - 7 x weekly - 1 sets - 10 reps - Hip Hinge Rock Back  - 1 x daily - 7 x weekly - 1 sets - 10 reps - Supine Bridge  - 1 x daily - 7 x weekly - 1 sets - 10 reps - overhead press  - 1 x daily - 7 x weekly - 1 sets - 10 reps - Seated Hip Flexion and External Rotation with Ankle Weight  - 1 x daily - 7 x weekly - 1 sets - 10 reps - Seated Hip Flexion March with Ankle Weights  - 1 x daily - 7 x weekly - 1 sets - 10 reps - Seated Cat Cow  - 1 x daily - 7 x weekly - 1 sets - 10 reps - Standing Row with Anchored Resistance  - 1 x daily - 7 x weekly - 1 sets - 10 reps  ASSESSMENT:  CLINICAL IMPRESSION: Patient is a 73 y.o. female  who was seen today for physical therapy treatment for pelvic, abdominal, back pain, urinary incontinence, and increased urinary frequency. Pt reports her groin and back pain  has returned and would like to attempt to bring this back down. Pt progressing toward all goals but due to medical complexity and need of extra time for cues for proper techniques, multiple reps for improved carry over pt has needed more time and visits to met goals. Pt very motivated to participate and would would benefit from additional PT to further address deficits.    OBJECTIVE IMPAIRMENTS: decreased activity tolerance, decreased coordination, decreased endurance, decreased mobility, difficulty walking, decreased strength, increased fascial restrictions, increased muscle spasms, impaired flexibility, improper body mechanics, postural dysfunction, and pain.   ACTIVITY LIMITATIONS:  carrying, lifting, sitting, standing, transfers, continence, and locomotion  level  PARTICIPATION LIMITATIONS: meal prep, cleaning, laundry, interpersonal relationship, community activity, and occupation  PERSONAL FACTORS: Fitness, Time since onset of injury/illness/exacerbation, and 1 comorbidity: medical history are also affecting patient's functional outcome.   REHAB POTENTIAL: Good  CLINICAL DECISION MAKING: Stable/uncomplicated  EVALUATION COMPLEXITY: Low   GOALS: Goals reviewed with patient? Yes  SHORT TERM GOALS: Target date: 07/14/24  Pt to be I with HEP for carry over and continuing recommendations for improved outcomes.   Baseline: Goal status: MET  2.  Pt to be I with pressure management techniques to decreased stress at pelvic floor for improved tolerance to standing and walking without pelvic symptoms.  Baseline:  Goal status: MET  3.  Pt to be I with scar massage for improved abdominal tissue mobility to decreased urinary frequency to better tolerate traveling.  Baseline:  Goal status: MET - I with knowing how to do it but does forget to do it  4.  Pt will be independent with the knack, urge suppression technique, and double voiding in order to improve bladder habits and decrease urinary incontinence for improved skin integrity.  Baseline:  Goal status: MET   LONG TERM GOALS: Target date: 09/16/24  Pt to be I with advanced HEP for carry over and continuing recommendations for improved outcomes.   Baseline:  Goal status: MET  2.  Pt to demonstrate improved coordination of pelvic floor and breathing mechanics with 10# squat with appropriate synergistic patterns to decrease pain and leakage at least 75% of the time for improved ability to complete a 30 minute walking without strain at pelvic floor and symptoms.    Baseline:  Goal status: MET  3. Pt to demonstrate at least 4+/5 bil hip strength for improved pelvic stability and functional squats without leakage.  Baseline:  Goal status: on going  4.  Pt to demonstrate improved core  strength with ability to complete sit up test 2/3 without pain or pelvic symptoms.  Baseline:  Goal status: on going  5.  Pt to report no more than one instance of urinary incontinence in a day for improved skin integrity and confidence to leave for errands.  Baseline:  Goal status: MET  6.  Pt to report no more than 3/10 pain at hip/pelvic complex due to improved mobility and strength of surrounding structures to tolerate at least 1 hour of standing and walking without worsening pain.  Baseline:  Goal status: back at higher levels now that is sitting more at home  PLAN:  PT FREQUENCY: 2x/week  PT DURATION: 20 sessions  PLANNED INTERVENTIONS: 97110-Therapeutic exercises, 97530- Therapeutic activity, 97112- Neuromuscular re-education, 97535- Self Care, 02859- Manual therapy, [phone]- Canalith repositioning, [id number]- Aquatic Therapy, [phone]- Electrical stimulation (manual), [id number]- Vasopneumatic device, [id number]- Ultrasound, [id number]- Traction (mechanical), [id number]- Ionotophoresis 4mg /ml Dexamethasone, [id number] (1-2 muscles), [id number] (3+ muscles)- Dry Needling, Patient/Family education, Balance training, Taping, Joint mobilization, Spinal mobilization, Scar mobilization, DME instructions, Cryotherapy, Moist heat, and Biofeedback  PLAN FOR NEXT SESSION: manual at abdomen, low back and glutes, core and hip strength, stretching hips and spine, coordination breathing and transverse abdominis activation activation    Darryle Navy, PT, DPT 10/02/254:17 PM  [facility] [address], [address] [place], [place] [address] Phone # [phone] Fax [phone]

## 2024-09-11 ENCOUNTER — Ambulatory Visit: Admitting: Physical Therapy

## 2024-09-11 DIAGNOSIS — R279 Unspecified lack of coordination: Secondary | ICD-10-CM

## 2024-09-11 DIAGNOSIS — M6281 Muscle weakness (generalized): Secondary | ICD-10-CM

## 2024-09-11 DIAGNOSIS — R293 Abnormal posture: Secondary | ICD-10-CM

## 2024-09-11 NOTE — Therapy (Signed)
 OUTPATIENT PHYSICAL THERAPY FEMALE PELVIC TREATMENT   Patient Name: Cindy Dudley MRN: [id number] DOB:1951-01-03, 73 y.o., female Today's Date: 09/11/2024   PT End of Session - 09/11/24 1151     Visit Number 13    Date for Recertification  11/05/24    Authorization Type [employer] medicare    Authorization Time Period [employer] Approved 8 vl : 09/01/2024 - 10/27/2024, jl#[id number]    Authorization - Visit Number 1    Authorization - Number of Visits 8    Progress Note Due on Visit 17    PT Start Time 1148    PT Stop Time 1230    PT Time Calculation (min) 42 min    Activity Tolerance Patient tolerated treatment well    Behavior During Therapy WFL for tasks assessed/performed           Past Medical History:  Diagnosis Date   Anemia    Asthma    Blood transfusion    Brain tumor (HCC) 1995   Bruises easily    Bursitis    left knee   Colitis, ulcerative (HCC) 1969   Fatigue    Fibroid tumor 1982   Hearing loss    Kidney stone    Ovarian cyst 1981   Rash    Sore throat    Spasmodic dysphonia 1982   Spasmodic torticollis 2007   Ulcer    Wears glasses    Past Surgical History:  Procedure Laterality Date   ABDOMINAL HYSTERECTOMY  1982   BRAIN TUMOR EXCISION  1995   CHOLECYSTECTOMY  2016   ILEOSTOMY  1982   LARYNX SURGERY  2000   OVARIAN CYST REMOVAL     Patient Active Problem List   Diagnosis Date Noted   Mid back pain 05/16/2024   Pelvic pain 05/16/2024   Ileostomy stenosis (HCC) 09/28/2023   Irritant contact dermatitis associated with fecal stoma 08/26/2023   Ileostomy care (HCC) 08/26/2023   Titubation 08/07/2022   Bilateral hearing loss 08/07/2022   Benign meningioma of brain (HCC) 09/18/2018   Hereditary essential tremor 09/18/2018   Thyroid  nodule, uninodular, left 10/17/2011    PCP: Antonio Cyndee Jamee JONELLE, DO   REFERRING PROVIDER: Antonio Cyndee Jamee JONELLE, DO   REFERRING DIAG: R10.2 (ICD-10-CM) - Pelvic pain  THERAPY DIAG:  Muscle weakness  (generalized)  Unspecified lack of coordination  Abnormal posture  Rationale for Evaluation and Treatment: Rehabilitation  ONSET DATE: chronic   SUBJECTIVE:  SUBJECTIVE STATEMENT: Reports her pain is about the same but has been very busy with home renovations and packing. Has had continued pain  Does note urinary incontinence with first morning void but others greatly improved.   32oz but limits to that to decreased urination  PAIN:   Are you having pain? Yes NPRS scale: 9/10 groin; 9/10 back Pain location: mid back pain  Pain type: sharp Pain description: intermittent   Aggravating factors: sitting is constant, standing longer than 15 mins, housework, walking for 30 mins, changing positions Relieving factors: lying down flat  PRECAUTIONS: None  RED FLAGS: None   WEIGHT BEARING RESTRICTIONS: No  FALLS:  Has patient fallen in last 6 months? No  OCCUPATION: retired   ACTIVITY LEVEL : low  PLOF: Independent  PATIENT GOALS: to have no pain, wants to stand at least 1 hour without pain  PERTINENT HISTORY:  Coccyx pain,  pelvic floor myalgia, possibly related to nerve damage (per MD note) or surgery, multiple hernia, ostomy, ABDOMINAL HYSTERECTOMY,  Sexual abuse: No  BOWEL MOVEMENT: Uses ostomy since 30s and I  not concerns at this time  URINATION: Pain with urination: No Fully empty bladder: Yes:   Stream: Strong and Weak Urgency: No Frequency: every 30 mins if drinking water, limits due to this and can sometimes make it an hour - 1.5, 3x night Leakage: Urge to void sometimes  Pads: No  INTERCOURSE:  Ability to have vaginal penetration Yes  Pain with intercourse: none DrynessYes  Climax: unable and reports decreased sensation  Marinoff Scale: 0/3  PREGNANCY: Vaginal  deliveries 3  C-section deliveries 0 Currently pregnant No  PROLAPSE: None   OBJECTIVE:  Note: Objective measures were completed at Evaluation unless otherwise noted.  DIAGNOSTIC FINDINGS:    PATIENT SURVEYS:   PFIQ-7: 95 08/06/24: PFIQ-7: 52 08/28/24: PFIQ-7: 48  COGNITION: Overall cognitive status: Within functional limits for tasks assessed     SENSATION: Light touch: Appears intact  LUMBAR SPECIAL TESTS:  SI Compression/distraction test: both increased pain at abdomen however back pain improved with compression  FUNCTIONAL TESTS:  Functional squats - unable to complete in proper mechanics, bil knee valgus, trunk flexion and decreased descent by 75%  Sit up test - 0/3 GAIT: Decreased cadence, trunk flexion, Rt rib rounding, decreased step height bil, decreased stride length  POSTURE: rounded shoulders, forward head, increased thoracic kyphosis, posterior pelvic tilt, and flexed trunk    LUMBARAROM/PROM:  A/PROM A/PROM  eval  Flexion WFL but with pain  Extension Limited by 25%  Right lateral flexion Limited by 50%  Left lateral flexion Limited by 50%  Right rotation Limited by 50%  Left rotation Limited by 50%   (Blank rows = not tested)  LOWER EXTREMITY ROM: Bil hamstrings and adductors limited by 50%  LOWER EXTREMITY MMT:  Bil hips grossly 3+/5 PALPATION:   General: tightness at bil thoracic and lumbar paraspinals, TTP at Lt SIJ, Lt gluteal and bil piriformis   Pelvic Alignment: Rt hip hike  Abdominal: TTP at mid quadrant, and lt mid quadrant; scar present throughout midline of abdomen and poor mobility throughout                 External Perineal Exam: dryness and redness present; most redness present at introitus                             Internal Pelvic Floor: TTP throughout bil superficial and deep layers  Patient confirms identification and approves PT to assess internal pelvic floor and treatment Yes No emotional/communication barriers or  cognitive limitation. Patient is motivated to learn. Patient understands and agrees with treatment goals and plan. PT explains patient will be examined in standing, sitting, and lying down to see how their muscles and joints work. When they are ready, they will be asked to remove their underwear so PT can examine their perineum. The patient is also given the option of providing their own chaperone as one is not provided in our facility. The patient also has the right and is explained the right to defer or refuse any part of the evaluation or treatment including the internal exam. With the patient's consent, PT will use one gloved finger to gently assess the muscles of the pelvic floor, seeing how well it contracts and relaxes and if there is muscle symmetry. After, the patient will get dressed and PT and patient will discuss exam findings and plan of care. PT and patient discuss plan of care, schedule, attendance policy and HEP activities.  PELVIC MMT:   MMT eval 08/11/25:  Vaginal 1/5 - unable to complete consistently, often bearing down 1/5, with much better techniques   Internal Anal Sphincter    External Anal Sphincter    Puborectalis    Diastasis Recti    (Blank rows = not tested)        TONE: Decreased   PROLAPSE: Possible anterior vaginal wall laxity present with coughing in hooklying   TODAY'S TREATMENT:                                                                                                                              DATE:    08/21/24: Pt had questions about her referring provider's referral - pt checked chart and educated pt on PT's did refer to urgogyn/urology and pt had several questions these providers and PT encouraged pt to reach out to that office or Dr. Antonio Meth about these Tail wags x20 Ant/post quad rocking x20 Open books x10 each 4# unilateral chest press with opposite hand/knee press x10 each X10 hooklying pelvic tilts with exhale X15 sit to stands  10#  08/27/24 Nustep 6 mins L5 PT present to discuss progress Seated cat/cows x10 Over head reach stretch with diaphragmatic breathing x5 Angel wing stretch 3x15s seated Shoulder rolls x10 back Lateral trunk stretch 2x30s rt  and lt Pelvic tilts x10 seated Seated marches 4# each 2x10 each with pelvic floor activation with each march and exhale  Seated 4# wide stepping 2x10 each Seated 4# ER x10 each X10 8# sit to stands  Farmer's carry 10# 750' each hand  08/28/24: Cat cow x10 Tail wags x10 Blue band hip abduction in hooklying 2x10 Blue band hip flexion in hooklying 2x10 Lumbar roll outs x10 rt/lt/ct 1/2 foam roller thoracic extension 30s, x10 wide claps Hip flexor stretch bil 3x30s Reverse clams x15 + 30s hold on last rep each  side Pelvic tilts on air disc 2x10 Hip shift x15 + 30s hold  09/11/24: Log rolling bil x5 with cues for technique and breathing mechanics for improved pressure management with bed mobility which causes her biggest source of urinary incontinence with full sit up and valsalva (demonstrated) in the morning to get out of bed - completed this with max cues to decreased urinary incontinence and puckering of ostomy bag Nustep 5 mins L7 PT present to discuss progress  Seated transverse abdominis activation with bil shoulder horizontal abduction green band 2x10 Seated opposite hand knee ball 2x10 alt  2x10 sit to stands 9# with OHP   PATIENT EDUCATION:  Education details: Interior and spatial designer Person educated: Patient Education method: Programmer, multimedia, Facilities manager, Actor cues, Verbal cues, and Handouts Education comprehension: verbalized understanding, returned demonstration, verbal cues required, tactile cues required, and needs further education  HOME EXERCISE PROGRAM: Access Code: [email or web address] URL: [email or web address] Date: 08/27/2024 Prepared by: Darryle  Exercises - Supine Diaphragmatic Breathing  - 1 x daily - 7 x weekly - 1 sets - 10 reps - Supine  Lower Trunk Rotation  - 1 x daily - 7 x weekly - 1 sets - 10 reps - Supported Teacher, music with Pelvic Floor Relaxation  - 1 x daily - 7 x weekly - 1 sets - 3 reps - 30s holds - Seated Overhead Reach Stretch  - 1 x daily - 7 x weekly - 1 sets - 10 reps - Hooklying Clamshell with Resistance  - 1 x daily - 7 x weekly - 2 sets - 10 reps - Sit to Stand with Pelvic Floor Contraction  - 1 x daily - 7 x weekly - 2 sets - 10 reps - pelvic floor contract and holds  - 1 x daily - 7 x weekly - 2 sets - 10 reps - 5s holds - Seated Quick Flick Pelvic Floor Contractions  - 1 x daily - 7 x weekly - 2 sets - 10 reps - Seated Transversus Abdominis Bracing  - 1 x daily - 7 x weekly - 2 sets - 10 reps - Seated Shoulder Diagonal Pulls with Resistance  - 1 x daily - 7 x weekly - 2 sets - 10 reps - seated pelvic circles  - 1 x daily - 7 x weekly - 2 sets - 10 reps - Tail Wag  - 1 x daily - 7 x weekly - 1 sets - 10 reps - Hip Hinge Rock Back  - 1 x daily - 7 x weekly - 1 sets - 10 reps - Supine Bridge  - 1 x daily - 7 x weekly - 1 sets - 10 reps - overhead press  - 1 x daily - 7 x weekly - 1 sets - 10 reps - Seated Hip Flexion and External Rotation with Ankle Weight  - 1 x daily - 7 x weekly - 1 sets - 10 reps - Seated Hip Flexion March with Ankle Weights  - 1 x daily - 7 x weekly - 1 sets - 10 reps - Seated Cat Cow  - 1 x daily - 7 x weekly - 1 sets - 10 reps - Standing Row with Anchored Resistance  - 1 x daily - 7 x weekly - 1 sets - 10 reps  ASSESSMENT:  CLINICAL IMPRESSION: Patient is a 73 y.o. female  who was seen today for physical therapy treatment for pelvic, abdominal, back pain, urinary incontinence, and increased urinary frequency. Pt reports urinary incontinence is much better but still happening  in AM, frequency limits her water intake, back pain and groin pain still present. Pt tolerated session well today with strengthening and education on bed mobility mechanics and denied leakage with log  rolling changes today in clinic. Pt very motivated to participate and would would benefit from additional PT to further address deficits.    OBJECTIVE IMPAIRMENTS: decreased activity tolerance, decreased coordination, decreased endurance, decreased mobility, difficulty walking, decreased strength, increased fascial restrictions, increased muscle spasms, impaired flexibility, improper body mechanics, postural dysfunction, and pain.   ACTIVITY LIMITATIONS: carrying, lifting, sitting, standing, transfers, continence, and locomotion level  PARTICIPATION LIMITATIONS: meal prep, cleaning, laundry, interpersonal relationship, community activity, and occupation  PERSONAL FACTORS: Fitness, Time since onset of injury/illness/exacerbation, and 1 comorbidity: medical history are also affecting patient's functional outcome.   REHAB POTENTIAL: Good  CLINICAL DECISION MAKING: Stable/uncomplicated  EVALUATION COMPLEXITY: Low   GOALS: Goals reviewed with patient? Yes  SHORT TERM GOALS: Target date: 07/14/24  Pt to be I with HEP for carry over and continuing recommendations for improved outcomes.   Baseline: Goal status: MET  2.  Pt to be I with pressure management techniques to decreased stress at pelvic floor for improved tolerance to standing and walking without pelvic symptoms.  Baseline:  Goal status: MET  3.  Pt to be I with scar massage for improved abdominal tissue mobility to decreased urinary frequency to better tolerate traveling.  Baseline:  Goal status: MET - I with knowing how to do it but does forget to do it  4.  Pt will be independent with the knack, urge suppression technique, and double voiding in order to improve bladder habits and decrease urinary incontinence for improved skin integrity.  Baseline:  Goal status: MET   LONG TERM GOALS: Target date: 09/16/24  Pt to be I with advanced HEP for carry over and continuing recommendations for improved outcomes.   Baseline:  Goal  status: MET  2.  Pt to demonstrate improved coordination of pelvic floor and breathing mechanics with 10# squat with appropriate synergistic patterns to decrease pain and leakage at least 75% of the time for improved ability to complete a 30 minute walking without strain at pelvic floor and symptoms.    Baseline:  Goal status: MET  3. Pt to demonstrate at least 4+/5 bil hip strength for improved pelvic stability and functional squats without leakage.  Baseline:  Goal status: on going  4.  Pt to demonstrate improved core strength with ability to complete sit up test 2/3 without pain or pelvic symptoms.  Baseline:  Goal status: on going  5.  Pt to report no more than one instance of urinary incontinence in a day for improved skin integrity and confidence to leave for errands.  Baseline:  Goal status: MET  6.  Pt to report no more than 3/10 pain at hip/pelvic complex due to improved mobility and strength of surrounding structures to tolerate at least 1 hour of standing and walking without worsening pain.  Baseline:  Goal status: back at higher levels now that is sitting more at home  PLAN:  PT FREQUENCY: 2x/week  PT DURATION: 20 sessions  PLANNED INTERVENTIONS: 97110-Therapeutic exercises, 97530- Therapeutic activity, 97112- Neuromuscular re-education, 97535- Self Care, [id number]- Manual therapy, [phone]- Canalith repositioning, [id number]- Aquatic Therapy, [phone]- Electrical stimulation (manual), [id number]- Vasopneumatic device, [id number]- Ultrasound, [id number]- Traction (mechanical), [id number]- Ionotophoresis 4mg /ml Dexamethasone, [id number] (1-2 muscles), [id number] (3+ muscles)- Dry Needling, Patient/Family education, Balance training, Taping, Joint mobilization, Spinal mobilization, Scar mobilization, DME instructions, Cryotherapy, Moist heat,  and Biofeedback  PLAN FOR NEXT SESSION: manual at abdomen, low back and glutes, core and hip strength, stretching hips and spine, coordination breathing and transverse abdominis  activation activation    Darryle Navy, PT, DPT 09/12/2511:57 PM  [facility] [address] [place], [place] [address] Phone # [phone] Fax [phone]

## 2024-09-23 ENCOUNTER — Other Ambulatory Visit: Payer: Self-pay | Admitting: Medical Genetics

## 2024-09-29 ENCOUNTER — Ambulatory Visit: Attending: Family Medicine | Admitting: Physical Therapy

## 2024-09-29 DIAGNOSIS — M62838 Other muscle spasm: Secondary | ICD-10-CM | POA: Insufficient documentation

## 2024-09-29 DIAGNOSIS — R293 Abnormal posture: Secondary | ICD-10-CM | POA: Insufficient documentation

## 2024-09-29 DIAGNOSIS — M6281 Muscle weakness (generalized): Secondary | ICD-10-CM | POA: Insufficient documentation

## 2024-09-29 DIAGNOSIS — R279 Unspecified lack of coordination: Secondary | ICD-10-CM | POA: Insufficient documentation

## 2024-09-29 NOTE — Therapy (Signed)
 OUTPATIENT PHYSICAL THERAPY FEMALE PELVIC TREATMENT   Patient Name: Cindy Dudley MRN: [id number] DOB:14-Jul-1951, 73 y.o., female Today's Date: 09/29/2024   PT End of Session - 09/29/24 1151     Visit Number 14    Date for Recertification  11/05/24    Authorization Type [employer] medicare    Authorization Time Period [employer] Approved 8 vl : 09/01/2024 - 10/27/2024, jl#[id number]    Authorization - Visit Number 2    Authorization - Number of Visits 8    Progress Note Due on Visit 17    PT Start Time 1149    PT Stop Time 1227    PT Time Calculation (min) 38 min    Activity Tolerance Patient tolerated treatment well    Behavior During Therapy WFL for tasks assessed/performed           Past Medical History:  Diagnosis Date   Anemia    Asthma    Blood transfusion    Brain tumor (HCC) 1995   Bruises easily    Bursitis    left knee   Colitis, ulcerative (HCC) 1969   Fatigue    Fibroid tumor 1982   Hearing loss    Kidney stone    Ovarian cyst 1981   Rash    Sore throat    Spasmodic dysphonia 1982   Spasmodic torticollis 2007   Ulcer    Wears glasses    Past Surgical History:  Procedure Laterality Date   ABDOMINAL HYSTERECTOMY  1982   BRAIN TUMOR EXCISION  1995   CHOLECYSTECTOMY  2016   ILEOSTOMY  1982   LARYNX SURGERY  2000   OVARIAN CYST REMOVAL     Patient Active Problem List   Diagnosis Date Noted   Mid back pain 05/16/2024   Pelvic pain 05/16/2024   Ileostomy stenosis (HCC) 09/28/2023   Irritant contact dermatitis associated with fecal stoma 08/26/2023   Ileostomy care (HCC) 08/26/2023   Titubation 08/07/2022   Bilateral hearing loss 08/07/2022   Benign meningioma of brain (HCC) 09/18/2018   Hereditary essential tremor 09/18/2018   Thyroid  nodule, uninodular, left 10/17/2011    PCP: Antonio Cyndee Jamee JONELLE, DO   REFERRING PROVIDER: Antonio Cyndee Jamee JONELLE, DO   REFERRING DIAG: R10.2 (ICD-10-CM) - Pelvic pain  THERAPY DIAG:  Muscle weakness  (generalized)  Other muscle spasm  Unspecified lack of coordination  Abnormal posture  Rationale for Evaluation and Treatment: Rehabilitation  ONSET DATE: chronic   SUBJECTIVE:  SUBJECTIVE STATEMENT:  Pain is happening less often now but when happens is still 8-9/10.  Saw ostomy nurse and has been seeing improvement with frequency of changing bags with these recommendations. Also has had no urinary incontinence since that appointment and very pleased with this. Has been increasing water intake and no longer bothered with frequency. At least an hour if not longer.   32oz but limits to that to decreased urination  PAIN:   Are you having pain? Yes NPRS scale:1/10 Pain location: mid back pain  Pain type: dull Pain description: intermittent   Aggravating factors: sitting is constant, standing longer than 15 mins, housework, walking for 30 mins, changing positions Relieving factors: lying down flat  PRECAUTIONS: None  RED FLAGS: None   WEIGHT BEARING RESTRICTIONS: No  FALLS:  Has patient fallen in last 6 months? No  OCCUPATION: retired   ACTIVITY LEVEL : low  PLOF: Independent  PATIENT GOALS: to have no pain, wants to stand at least 1 hour without pain  PERTINENT HISTORY:  Coccyx pain,  pelvic floor myalgia, possibly related to nerve damage (per MD note) or surgery, multiple hernia, ostomy, ABDOMINAL HYSTERECTOMY,  Sexual abuse: No  BOWEL MOVEMENT: Uses ostomy since 22s and I  not concerns at this time  URINATION: Pain with urination: No Fully empty bladder: Yes:   Stream: Strong and Weak Urgency: No Frequency: every 30 mins if drinking water, limits due to this and can sometimes make it an hour - 1.5, 3x night Leakage: Urge to void sometimes  Pads: No  INTERCOURSE:  Ability  to have vaginal penetration Yes  Pain with intercourse: none DrynessYes  Climax: unable and reports decreased sensation  Marinoff Scale: 0/3  PREGNANCY: Vaginal deliveries 3  C-section deliveries 0 Currently pregnant No  PROLAPSE: None   OBJECTIVE:  Note: Objective measures were completed at Evaluation unless otherwise noted.  DIAGNOSTIC FINDINGS:    PATIENT SURVEYS:   PFIQ-7: 95 08/06/24: PFIQ-7: 52 08/28/24: PFIQ-7: 48  COGNITION: Overall cognitive status: Within functional limits for tasks assessed     SENSATION: Light touch: Appears intact  LUMBAR SPECIAL TESTS:  SI Compression/distraction test: both increased pain at abdomen however back pain improved with compression  FUNCTIONAL TESTS:  Functional squats - unable to complete in proper mechanics, bil knee valgus, trunk flexion and decreased descent by 75%  Sit up test - 0/3 GAIT: Decreased cadence, trunk flexion, Rt rib rounding, decreased step height bil, decreased stride length  POSTURE: rounded shoulders, forward head, increased thoracic kyphosis, posterior pelvic tilt, and flexed trunk    LUMBARAROM/PROM:  A/PROM A/PROM  eval  Flexion WFL but with pain  Extension Limited by 25%  Right lateral flexion Limited by 50%  Left lateral flexion Limited by 50%  Right rotation Limited by 50%  Left rotation Limited by 50%   (Blank rows = not tested)  LOWER EXTREMITY ROM: Bil hamstrings and adductors limited by 50%  LOWER EXTREMITY MMT:  Bil hips grossly 3+/5 PALPATION:   General: tightness at bil thoracic and lumbar paraspinals, TTP at Lt SIJ, Lt gluteal and bil piriformis   Pelvic Alignment: Rt hip hike  Abdominal: TTP at mid quadrant, and lt mid quadrant; scar present throughout midline of abdomen and poor mobility throughout                 External Perineal Exam: dryness and redness present; most redness present at introitus  Internal Pelvic Floor: TTP throughout bil  superficial and deep layers  Patient confirms identification and approves PT to assess internal pelvic floor and treatment Yes No emotional/communication barriers or cognitive limitation. Patient is motivated to learn. Patient understands and agrees with treatment goals and plan. PT explains patient will be examined in standing, sitting, and lying down to see how their muscles and joints work. When they are ready, they will be asked to remove their underwear so PT can examine their perineum. The patient is also given the option of providing their own chaperone as one is not provided in our facility. The patient also has the right and is explained the right to defer or refuse any part of the evaluation or treatment including the internal exam. With the patient's consent, PT will use one gloved finger to gently assess the muscles of the pelvic floor, seeing how well it contracts and relaxes and if there is muscle symmetry. After, the patient will get dressed and PT and patient will discuss exam findings and plan of care. PT and patient discuss plan of care, schedule, attendance policy and HEP activities.  PELVIC MMT:   MMT eval 08/11/25:  Vaginal 1/5 - unable to complete consistently, often bearing down 1/5, with much better techniques   Internal Anal Sphincter    External Anal Sphincter    Puborectalis    Diastasis Recti    (Blank rows = not tested)        TONE: Decreased   PROLAPSE: Possible anterior vaginal wall laxity present with coughing in hooklying   TODAY'S TREATMENT:                                                                                                                              DATE:  08/28/24: Cat cow x10 Tail wags x10 Blue band hip abduction in hooklying 2x10 Blue band hip flexion in hooklying 2x10 Lumbar roll outs x10 rt/lt/ct 1/2 foam roller thoracic extension 30s, x10 wide claps Hip flexor stretch bil 3x30s Reverse clams x15 + 30s hold on last rep each  side Pelvic tilts on air disc 2x10 Hip shift x15 + 30s hold  09/11/24: Log rolling bil x5 with cues for technique and breathing mechanics for improved pressure management with bed mobility which causes her biggest source of urinary incontinence with full sit up and valsalva (demonstrated) in the morning to get out of bed - completed this with max cues to decreased urinary incontinence and puckering of ostomy bag Nustep 5 mins L7 PT present to discuss progress  Seated transverse abdominis activation with bil shoulder horizontal abduction green band 2x10 Seated opposite hand knee ball 2x10 alt  2x10 sit to stands 9# with OHP  09/29/24: Nustep - 6 mins L7 - PT present to discuss progress Sit to stands 4# each hand with OHP 2x10 Forward bent rows 4# 2x10 Seated hip abduction blue band x20 Seated hip flexion blue band x20 Seated blue band bil  shoulder horizontal abduction x20  Farmer's carry 4# each hand 1000' x2    PATIENT EDUCATION:  Education details: INTERIOR AND SPATIAL DESIGNER Person educated: Patient Education method: Explanation, Demonstration, Tactile cues, Verbal cues, and Handouts Education comprehension: verbalized understanding, returned demonstration, verbal cues required, tactile cues required, and needs further education  HOME EXERCISE PROGRAM: Access Code: [email or web address] URL: [email or web address] Date: 08/27/2024 Prepared by: Darryle  Exercises - Supine Diaphragmatic Breathing  - 1 x daily - 7 x weekly - 1 sets - 10 reps - Supine Lower Trunk Rotation  - 1 x daily - 7 x weekly - 1 sets - 10 reps - Supported Teacher, Music with Pelvic Floor Relaxation  - 1 x daily - 7 x weekly - 1 sets - 3 reps - 30s holds - Seated Overhead Reach Stretch  - 1 x daily - 7 x weekly - 1 sets - 10 reps - Hooklying Clamshell with Resistance  - 1 x daily - 7 x weekly - 2 sets - 10 reps - Sit to Stand with Pelvic Floor Contraction  - 1 x daily - 7 x weekly - 2 sets - 10 reps - pelvic floor contract  and holds  - 1 x daily - 7 x weekly - 2 sets - 10 reps - 5s holds - Seated Quick Flick Pelvic Floor Contractions  - 1 x daily - 7 x weekly - 2 sets - 10 reps - Seated Transversus Abdominis Bracing  - 1 x daily - 7 x weekly - 2 sets - 10 reps - Seated Shoulder Diagonal Pulls with Resistance  - 1 x daily - 7 x weekly - 2 sets - 10 reps - seated pelvic circles  - 1 x daily - 7 x weekly - 2 sets - 10 reps - Tail Wag  - 1 x daily - 7 x weekly - 1 sets - 10 reps - Hip Hinge Rock Back  - 1 x daily - 7 x weekly - 1 sets - 10 reps - Supine Bridge  - 1 x daily - 7 x weekly - 1 sets - 10 reps - overhead press  - 1 x daily - 7 x weekly - 1 sets - 10 reps - Seated Hip Flexion and External Rotation with Ankle Weight  - 1 x daily - 7 x weekly - 1 sets - 10 reps - Seated Hip Flexion March with Ankle Weights  - 1 x daily - 7 x weekly - 1 sets - 10 reps - Seated Cat Cow  - 1 x daily - 7 x weekly - 1 sets - 10 reps - Standing Row with Anchored Resistance  - 1 x daily - 7 x weekly - 1 sets - 10 reps  ASSESSMENT:  CLINICAL IMPRESSION: Patient is a 73 y.o. female  who was seen today for physical therapy treatment for pelvic, abdominal, back pain, urinary incontinence, and increased urinary frequency. Pt reports she has seen great improvement in the past couple weeks, no longer having urinary incontinence and now changing ostomy bag every other day. Also pain is happening less often. Pt tolerated session well and had no leakage or pain. Pt very motivated to participate and would would benefit from additional PT to further address deficits.    OBJECTIVE IMPAIRMENTS: decreased activity tolerance, decreased coordination, decreased endurance, decreased mobility, difficulty walking, decreased strength, increased fascial restrictions, increased muscle spasms, impaired flexibility, improper body mechanics, postural dysfunction, and pain.   ACTIVITY LIMITATIONS: carrying, lifting, sitting, standing, transfers, continence, and  locomotion level  PARTICIPATION LIMITATIONS: meal prep, cleaning, laundry, interpersonal relationship, community activity, and occupation  PERSONAL FACTORS: Fitness, Time since onset of injury/illness/exacerbation, and 1 comorbidity: medical history are also affecting patient's functional outcome.   REHAB POTENTIAL: Good  CLINICAL DECISION MAKING: Stable/uncomplicated  EVALUATION COMPLEXITY: Low   GOALS: Goals reviewed with patient? Yes  SHORT TERM GOALS: Target date: 07/14/24  Pt to be I with HEP for carry over and continuing recommendations for improved outcomes.   Baseline: Goal status: MET  2.  Pt to be I with pressure management techniques to decreased stress at pelvic floor for improved tolerance to standing and walking without pelvic symptoms.  Baseline:  Goal status: MET  3.  Pt to be I with scar massage for improved abdominal tissue mobility to decreased urinary frequency to better tolerate traveling.  Baseline:  Goal status: MET - I with knowing how to do it but does forget to do it  4.  Pt will be independent with the knack, urge suppression technique, and double voiding in order to improve bladder habits and decrease urinary incontinence for improved skin integrity.  Baseline:  Goal status: MET   LONG TERM GOALS: Target date: 09/16/24  Pt to be I with advanced HEP for carry over and continuing recommendations for improved outcomes.   Baseline:  Goal status: MET  2.  Pt to demonstrate improved coordination of pelvic floor and breathing mechanics with 10# squat with appropriate synergistic patterns to decrease pain and leakage at least 75% of the time for improved ability to complete a 30 minute walking without strain at pelvic floor and symptoms.    Baseline:  Goal status: MET  3. Pt to demonstrate at least 4+/5 bil hip strength for improved pelvic stability and functional squats without leakage.  Baseline:  Goal status: on going  4.  Pt to demonstrate  improved core strength with ability to complete sit up test 2/3 without pain or pelvic symptoms.  Baseline:  Goal status: on going  5.  Pt to report no more than one instance of urinary incontinence in a day for improved skin integrity and confidence to leave for errands.  Baseline:  Goal status: MET  6.  Pt to report no more than 3/10 pain at hip/pelvic complex due to improved mobility and strength of surrounding structures to tolerate at least 1 hour of standing and walking without worsening pain.  Baseline:  Goal status: back at higher levels now that is sitting more at home  PLAN:  PT FREQUENCY: 2x/week  PT DURATION: 20 sessions  PLANNED INTERVENTIONS: 97110-Therapeutic exercises, 97530- Therapeutic activity, 97112- Neuromuscular re-education, 97535- Self Care, [id number]- Manual therapy, [phone]- Canalith repositioning, [id number]- Aquatic Therapy, [phone]- Electrical stimulation (manual), [id number]- Vasopneumatic device, [id number]- Ultrasound, [id number]- Traction (mechanical), [id number]- Ionotophoresis 4mg /ml Dexamethasone, [id number] (1-2 muscles), [id number] (3+ muscles)- Dry Needling, Patient/Family education, Balance training, Taping, Joint mobilization, Spinal mobilization, Scar mobilization, DME instructions, Cryotherapy, Moist heat, and Biofeedback  PLAN FOR NEXT SESSION: manual at abdomen, low back and glutes, core and hip strength, stretching hips and spine, coordination breathing and transverse abdominis activation activation    Darryle Navy, PT, DPT 09/30/2511:38 PM  [facility] [address] [place], [place] [address] Phone # [phone] Fax [phone]

## 2024-09-30 ENCOUNTER — Ambulatory Visit: Admitting: Physical Therapy

## 2024-09-30 DIAGNOSIS — M62838 Other muscle spasm: Secondary | ICD-10-CM

## 2024-09-30 DIAGNOSIS — M6281 Muscle weakness (generalized): Secondary | ICD-10-CM

## 2024-09-30 DIAGNOSIS — R293 Abnormal posture: Secondary | ICD-10-CM

## 2024-09-30 DIAGNOSIS — R279 Unspecified lack of coordination: Secondary | ICD-10-CM

## 2024-09-30 NOTE — Therapy (Signed)
 OUTPATIENT PHYSICAL THERAPY FEMALE PELVIC TREATMENT   Patient Name: Cindy Dudley MRN: [id number] DOB:31-Mar-1951, 73 y.o., female Today's Date: 09/30/2024   PT End of Session - 09/30/24 1407     Visit Number 15    Date for Recertification  11/05/24    Authorization Type [employer] medicare    Authorization Time Period [employer] Approved 8 vl : 09/01/2024 - 10/27/2024, jl#[id number]    Authorization - Visit Number 3    Authorization - Number of Visits 8    Progress Note Due on Visit 17    PT Start Time 1403    PT Stop Time 1442    PT Time Calculation (min) 39 min    Activity Tolerance Patient tolerated treatment well    Behavior During Therapy WFL for tasks assessed/performed            Past Medical History:  Diagnosis Date   Anemia    Asthma    Blood transfusion    Brain tumor (HCC) 1995   Bruises easily    Bursitis    left knee   Colitis, ulcerative (HCC) 1969   Fatigue    Fibroid tumor 1982   Hearing loss    Kidney stone    Ovarian cyst 1981   Rash    Sore throat    Spasmodic dysphonia 1982   Spasmodic torticollis 2007   Ulcer    Wears glasses    Past Surgical History:  Procedure Laterality Date   ABDOMINAL HYSTERECTOMY  1982   BRAIN TUMOR EXCISION  1995   CHOLECYSTECTOMY  2016   ILEOSTOMY  1982   LARYNX SURGERY  2000   OVARIAN CYST REMOVAL     Patient Active Problem List   Diagnosis Date Noted   Mid back pain 05/16/2024   Pelvic pain 05/16/2024   Ileostomy stenosis (HCC) 09/28/2023   Irritant contact dermatitis associated with fecal stoma 08/26/2023   Ileostomy care (HCC) 08/26/2023   Titubation 08/07/2022   Bilateral hearing loss 08/07/2022   Benign meningioma of brain (HCC) 09/18/2018   Hereditary essential tremor 09/18/2018   Thyroid  nodule, uninodular, left 10/17/2011    PCP: Antonio Cyndee Jamee JONELLE, DO   REFERRING PROVIDER: Antonio Cyndee Jamee JONELLE, DO   REFERRING DIAG: R10.2 (ICD-10-CM) - Pelvic pain  THERAPY DIAG:  Muscle weakness  (generalized)  Other muscle spasm  Unspecified lack of coordination  Abnormal posture  Rationale for Evaluation and Treatment: Rehabilitation  ONSET DATE: chronic   SUBJECTIVE:  SUBJECTIVE STATEMENT:  Back feels good today, tolerated well after session yesterday. Urine still doing better.   32oz but limits to that to decreased urination  PAIN:   Are you having pain? Yes NPRS scale:1/10 Pain location: mid back pain  Pain type: dull Pain description: intermittent   Aggravating factors: sitting is constant, standing longer than 15 mins, housework, walking for 30 mins, changing positions Relieving factors: lying down flat  PRECAUTIONS: None  RED FLAGS: None   WEIGHT BEARING RESTRICTIONS: No  FALLS:  Has patient fallen in last 6 months? No  OCCUPATION: retired   ACTIVITY LEVEL : low  PLOF: Independent  PATIENT GOALS: to have no pain, wants to stand at least 1 hour without pain  PERTINENT HISTORY:  Coccyx pain,  pelvic floor myalgia, possibly related to nerve damage (per MD note) or surgery, multiple hernia, ostomy, ABDOMINAL HYSTERECTOMY,  Sexual abuse: No  BOWEL MOVEMENT: Uses ostomy since 3s and I  not concerns at this time  URINATION: Pain with urination: No Fully empty bladder: Yes:   Stream: Strong and Weak Urgency: No Frequency: every 30 mins if drinking water, limits due to this and can sometimes make it an hour - 1.5, 3x night Leakage: Urge to void sometimes  Pads: No  INTERCOURSE:  Ability to have vaginal penetration Yes  Pain with intercourse: none DrynessYes  Climax: unable and reports decreased sensation  Marinoff Scale: 0/3  PREGNANCY: Vaginal deliveries 3  C-section deliveries 0 Currently pregnant No  PROLAPSE: None   OBJECTIVE:  Note:  Objective measures were completed at Evaluation unless otherwise noted.  DIAGNOSTIC FINDINGS:    PATIENT SURVEYS:   PFIQ-7: 95 08/06/24: PFIQ-7: 52 08/28/24: PFIQ-7: 48  COGNITION: Overall cognitive status: Within functional limits for tasks assessed     SENSATION: Light touch: Appears intact  LUMBAR SPECIAL TESTS:  SI Compression/distraction test: both increased pain at abdomen however back pain improved with compression  FUNCTIONAL TESTS:  Functional squats - unable to complete in proper mechanics, bil knee valgus, trunk flexion and decreased descent by 75%  Sit up test - 0/3 GAIT: Decreased cadence, trunk flexion, Rt rib rounding, decreased step height bil, decreased stride length  POSTURE: rounded shoulders, forward head, increased thoracic kyphosis, posterior pelvic tilt, and flexed trunk    LUMBARAROM/PROM:  A/PROM A/PROM  eval  Flexion WFL but with pain  Extension Limited by 25%  Right lateral flexion Limited by 50%  Left lateral flexion Limited by 50%  Right rotation Limited by 50%  Left rotation Limited by 50%   (Blank rows = not tested)  LOWER EXTREMITY ROM: Bil hamstrings and adductors limited by 50%  LOWER EXTREMITY MMT:  Bil hips grossly 3+/5 PALPATION:   General: tightness at bil thoracic and lumbar paraspinals, TTP at Lt SIJ, Lt gluteal and bil piriformis   Pelvic Alignment: Rt hip hike  Abdominal: TTP at mid quadrant, and lt mid quadrant; scar present throughout midline of abdomen and poor mobility throughout                 External Perineal Exam: dryness and redness present; most redness present at introitus                             Internal Pelvic Floor: TTP throughout bil superficial and deep layers  Patient confirms identification and approves PT to assess internal pelvic floor and treatment Yes No emotional/communication barriers or cognitive limitation. Patient is motivated to  learn. Patient understands and agrees with treatment goals  and plan. PT explains patient will be examined in standing, sitting, and lying down to see how their muscles and joints work. When they are ready, they will be asked to remove their underwear so PT can examine their perineum. The patient is also given the option of providing their own chaperone as one is not provided in our facility. The patient also has the right and is explained the right to defer or refuse any part of the evaluation or treatment including the internal exam. With the patient's consent, PT will use one gloved finger to gently assess the muscles of the pelvic floor, seeing how well it contracts and relaxes and if there is muscle symmetry. After, the patient will get dressed and PT and patient will discuss exam findings and plan of care. PT and patient discuss plan of care, schedule, attendance policy and HEP activities.  PELVIC MMT:   MMT eval 08/11/25:  Vaginal 1/5 - unable to complete consistently, often bearing down 1/5, with much better techniques   Internal Anal Sphincter    External Anal Sphincter    Puborectalis    Diastasis Recti    (Blank rows = not tested)        TONE: Decreased   PROLAPSE: Possible anterior vaginal wall laxity present with coughing in hooklying   TODAY'S TREATMENT:                                                                                                                              DATE:  08/28/24: Cat cow x10 Tail wags x10 Blue band hip abduction in hooklying 2x10 Blue band hip flexion in hooklying 2x10 Lumbar roll outs x10 rt/lt/ct 1/2 foam roller thoracic extension 30s, x10 wide claps Hip flexor stretch bil 3x30s Reverse clams x15 + 30s hold on last rep each side Pelvic tilts on air disc 2x10 Hip shift x15 + 30s hold  09/11/24: Log rolling bil x5 with cues for technique and breathing mechanics for improved pressure management with bed mobility which causes her biggest source of urinary incontinence with full sit up and valsalva  (demonstrated) in the morning to get out of bed - completed this with max cues to decreased urinary incontinence and puckering of ostomy bag Nustep 5 mins L7 PT present to discuss progress  Seated transverse abdominis activation with bil shoulder horizontal abduction green band 2x10 Seated opposite hand knee ball 2x10 alt  2x10 sit to stands 9# with OHP  09/29/24: Nustep - 6 mins L7 - PT present to discuss progress Sit to stands 4# each hand with OHP 2x10 Forward bent rows 4# 2x10 Seated hip abduction blue band x20 Seated hip flexion blue band x20 Seated blue band bil shoulder horizontal abduction x20  Farmer's carry 4# each hand 1000' x2   09/30/24: Seated opposite hand/knee ball press x20 each Seated bil shoulder horizontal abduction blue band 2x10 Forward bent rows 5# 2x10  each Sit to stands with OHP 5# each hand x20 Bridges 2x10 Blue band 2x10 hip abduction in hooklying   Blue band hip flexion alt in hooklying 2x10 Sidelying blue band at ankles ball press with hip abduction 2x10 Hooklying hip IR 2x10 each Standing marching on foam pad 2x10 Standing heel raises on foam pad 2x10  PATIENT EDUCATION:  Education details: INTERIOR AND SPATIAL DESIGNER Person educated: Patient Education method: Programmer, Multimedia, Facilities Manager, Actor cues, Verbal cues, and Handouts Education comprehension: verbalized understanding, returned demonstration, verbal cues required, tactile cues required, and needs further education  HOME EXERCISE PROGRAM: Access Code: [email or web address] URL: [email or web address] Date: 08/27/2024 Prepared by: Darryle  Exercises - Supine Diaphragmatic Breathing  - 1 x daily - 7 x weekly - 1 sets - 10 reps - Supine Lower Trunk Rotation  - 1 x daily - 7 x weekly - 1 sets - 10 reps - Supported Teacher, Music with Pelvic Floor Relaxation  - 1 x daily - 7 x weekly - 1 sets - 3 reps - 30s holds - Seated Overhead Reach Stretch  - 1 x daily - 7 x weekly - 1 sets - 10 reps - Hooklying Clamshell  with Resistance  - 1 x daily - 7 x weekly - 2 sets - 10 reps - Sit to Stand with Pelvic Floor Contraction  - 1 x daily - 7 x weekly - 2 sets - 10 reps - pelvic floor contract and holds  - 1 x daily - 7 x weekly - 2 sets - 10 reps - 5s holds - Seated Quick Flick Pelvic Floor Contractions  - 1 x daily - 7 x weekly - 2 sets - 10 reps - Seated Transversus Abdominis Bracing  - 1 x daily - 7 x weekly - 2 sets - 10 reps - Seated Shoulder Diagonal Pulls with Resistance  - 1 x daily - 7 x weekly - 2 sets - 10 reps - seated pelvic circles  - 1 x daily - 7 x weekly - 2 sets - 10 reps - Tail Wag  - 1 x daily - 7 x weekly - 1 sets - 10 reps - Hip Hinge Rock Back  - 1 x daily - 7 x weekly - 1 sets - 10 reps - Supine Bridge  - 1 x daily - 7 x weekly - 1 sets - 10 reps - overhead press  - 1 x daily - 7 x weekly - 1 sets - 10 reps - Seated Hip Flexion and External Rotation with Ankle Weight  - 1 x daily - 7 x weekly - 1 sets - 10 reps - Seated Hip Flexion March with Ankle Weights  - 1 x daily - 7 x weekly - 1 sets - 10 reps - Seated Cat Cow  - 1 x daily - 7 x weekly - 1 sets - 10 reps - Standing Row with Anchored Resistance  - 1 x daily - 7 x weekly - 1 sets - 10 reps  ASSESSMENT:  CLINICAL IMPRESSION: Patient is a 73 y.o. female  who was seen today for physical therapy treatment for pelvic, abdominal, back pain, urinary incontinence, and increased urinary frequency. Pt reports continued improvement and no worsening of back pain with exercises yesterday after session. Pt tolerated well today and did attempt higher resistance band without pain and good mechanics. Pt very motivated to participate and would would benefit from additional PT to further address deficits.    OBJECTIVE IMPAIRMENTS: decreased activity tolerance, decreased coordination, decreased endurance, decreased mobility, difficulty  walking, decreased strength, increased fascial restrictions, increased muscle spasms, impaired flexibility, improper  body mechanics, postural dysfunction, and pain.   ACTIVITY LIMITATIONS: carrying, lifting, sitting, standing, transfers, continence, and locomotion level  PARTICIPATION LIMITATIONS: meal prep, cleaning, laundry, interpersonal relationship, community activity, and occupation  PERSONAL FACTORS: Fitness, Time since onset of injury/illness/exacerbation, and 1 comorbidity: medical history are also affecting patient's functional outcome.   REHAB POTENTIAL: Good  CLINICAL DECISION MAKING: Stable/uncomplicated  EVALUATION COMPLEXITY: Low   GOALS: Goals reviewed with patient? Yes  SHORT TERM GOALS: Target date: 07/14/24  Pt to be I with HEP for carry over and continuing recommendations for improved outcomes.   Baseline: Goal status: MET  2.  Pt to be I with pressure management techniques to decreased stress at pelvic floor for improved tolerance to standing and walking without pelvic symptoms.  Baseline:  Goal status: MET  3.  Pt to be I with scar massage for improved abdominal tissue mobility to decreased urinary frequency to better tolerate traveling.  Baseline:  Goal status: MET - I with knowing how to do it but does forget to do it  4.  Pt will be independent with the knack, urge suppression technique, and double voiding in order to improve bladder habits and decrease urinary incontinence for improved skin integrity.  Baseline:  Goal status: MET   LONG TERM GOALS: Target date: 09/16/24  Pt to be I with advanced HEP for carry over and continuing recommendations for improved outcomes.   Baseline:  Goal status: MET  2.  Pt to demonstrate improved coordination of pelvic floor and breathing mechanics with 10# squat with appropriate synergistic patterns to decrease pain and leakage at least 75% of the time for improved ability to complete a 30 minute walking without strain at pelvic floor and symptoms.    Baseline:  Goal status: MET  3. Pt to demonstrate at least 4+/5 bil hip  strength for improved pelvic stability and functional squats without leakage.  Baseline:  Goal status: on going  4.  Pt to demonstrate improved core strength with ability to complete sit up test 2/3 without pain or pelvic symptoms.  Baseline:  Goal status: on going  5.  Pt to report no more than one instance of urinary incontinence in a day for improved skin integrity and confidence to leave for errands.  Baseline:  Goal status: MET  6.  Pt to report no more than 3/10 pain at hip/pelvic complex due to improved mobility and strength of surrounding structures to tolerate at least 1 hour of standing and walking without worsening pain.  Baseline:  Goal status: back at higher levels now that is sitting more at home  PLAN:  PT FREQUENCY: 2x/week  PT DURATION: 20 sessions  PLANNED INTERVENTIONS: 97110-Therapeutic exercises, 97530- Therapeutic activity, 97112- Neuromuscular re-education, 97535- Self Care, [id number]- Manual therapy, [phone]- Canalith repositioning, [id number]- Aquatic Therapy, [phone]- Electrical stimulation (manual), [id number]- Vasopneumatic device, [id number]- Ultrasound, [id number]- Traction (mechanical), [id number]- Ionotophoresis 4mg /ml Dexamethasone, [id number] (1-2 muscles), [id number] (3+ muscles)- Dry Needling, Patient/Family education, Balance training, Taping, Joint mobilization, Spinal mobilization, Scar mobilization, DME instructions, Cryotherapy, Moist heat, and Biofeedback  PLAN FOR NEXT SESSION: core and hip strength, stretching hips and spine, coordination breathing and transverse abdominis activation activation    Darryle Navy, PT, DPT 11/04/252:44 PM  [facility] [address], [address] [place], [place] [address] Phone # [phone] Fax [phone]

## 2024-10-06 ENCOUNTER — Encounter: Payer: Self-pay | Admitting: Physical Therapy

## 2024-10-06 ENCOUNTER — Ambulatory Visit: Admitting: Physical Therapy

## 2024-10-06 DIAGNOSIS — R293 Abnormal posture: Secondary | ICD-10-CM

## 2024-10-06 DIAGNOSIS — R279 Unspecified lack of coordination: Secondary | ICD-10-CM

## 2024-10-06 DIAGNOSIS — M62838 Other muscle spasm: Secondary | ICD-10-CM

## 2024-10-06 DIAGNOSIS — M6281 Muscle weakness (generalized): Secondary | ICD-10-CM | POA: Diagnosis not present

## 2024-10-06 NOTE — Therapy (Signed)
 OUTPATIENT PHYSICAL THERAPY FEMALE PELVIC TREATMENT   Patient Name: Cindy Dudley MRN: [id number] DOB:01/12/1951, 73 y.o., female Today's Date: 10/06/2024   PT End of Session - 10/06/24 0925     Visit Number 16    Date for Recertification  11/05/24    Authorization Type [employer] medicare    Authorization Time Period [employer] Approved 8 vl : 09/01/2024 - 10/27/2024, jl#[id number]    Authorization - Visit Number 4    Authorization - Number of Visits 8    Progress Note Due on Visit 17    PT Start Time 0930    PT Stop Time 1010    PT Time Calculation (min) 40 min    Activity Tolerance Patient tolerated treatment well    Behavior During Therapy WFL for tasks assessed/performed            Past Medical History:  Diagnosis Date   Anemia    Asthma    Blood transfusion    Brain tumor (HCC) 1995   Bruises easily    Bursitis    left knee   Colitis, ulcerative (HCC) 1969   Fatigue    Fibroid tumor 1982   Hearing loss    Kidney stone    Ovarian cyst 1981   Rash    Sore throat    Spasmodic dysphonia 1982   Spasmodic torticollis 2007   Ulcer    Wears glasses    Past Surgical History:  Procedure Laterality Date   ABDOMINAL HYSTERECTOMY  1982   BRAIN TUMOR EXCISION  1995   CHOLECYSTECTOMY  2016   ILEOSTOMY  1982   LARYNX SURGERY  2000   OVARIAN CYST REMOVAL     Patient Active Problem List   Diagnosis Date Noted   Mid back pain 05/16/2024   Pelvic pain 05/16/2024   Ileostomy stenosis (HCC) 09/28/2023   Irritant contact dermatitis associated with fecal stoma 08/26/2023   Ileostomy care (HCC) 08/26/2023   Titubation 08/07/2022   Bilateral hearing loss 08/07/2022   Benign meningioma of brain (HCC) 09/18/2018   Hereditary essential tremor 09/18/2018   Thyroid  nodule, uninodular, left 10/17/2011    PCP: Antonio Cyndee Jamee JONELLE, DO   REFERRING PROVIDER: Antonio Cyndee Jamee JONELLE, DO   REFERRING DIAG: R10.2 (ICD-10-CM) - Pelvic pain  THERAPY DIAG:  Muscle weakness  (generalized)  Other muscle spasm  Unspecified lack of coordination  Abnormal posture  Rationale for Evaluation and Treatment: Rehabilitation  ONSET DATE: chronic   SUBJECTIVE:  SUBJECTIVE STATEMENT: I have been in a lot of pain in the past few days. Patient is having pain on the edge of the right foot while she is in her shoe. Pain will increase when in shoe and walking.   32oz but limits to that to decreased urination  PAIN:   Are you having pain? Yes NPRS scale:1/10 10/06/24: pain level 8/10, hurts to breath and having spasms. Pain location: mid back pain  Pain type: dull Pain description: intermittent   Aggravating factors: sitting is constant, standing longer than 15 mins, housework, walking for 30 mins, changing positions Relieving factors: lying down flat  PRECAUTIONS: None  RED FLAGS: None   WEIGHT BEARING RESTRICTIONS: No  FALLS:  Has patient fallen in last 6 months? No  OCCUPATION: retired   ACTIVITY LEVEL : low  PLOF: Independent  PATIENT GOALS: to have no pain, wants to stand at least 1 hour without pain  PERTINENT HISTORY:  Coccyx pain,  pelvic floor myalgia, possibly related to nerve damage (per MD note) or surgery, multiple hernia, ostomy, ABDOMINAL HYSTERECTOMY,  Sexual abuse: No  BOWEL MOVEMENT: Uses ostomy since 16s and I  not concerns at this time  URINATION: Pain with urination: No Fully empty bladder: Yes:   Stream: Strong and Weak Urgency: No Frequency: every 30 mins if drinking water, limits due to this and can sometimes make it an hour - 1.5, 3x night Leakage: Urge to void sometimes  Pads: No  INTERCOURSE:  Ability to have vaginal penetration Yes  Pain with intercourse: none DrynessYes  Climax: unable and reports decreased sensation  Marinoff  Scale: 0/3  PREGNANCY: Vaginal deliveries 3  C-section deliveries 0 Currently pregnant No  PROLAPSE: None   OBJECTIVE:  Note: Objective measures were completed at Evaluation unless otherwise noted.  DIAGNOSTIC FINDINGS:    PATIENT SURVEYS:   PFIQ-7: 95 08/06/24: PFIQ-7: 52 08/28/24: PFIQ-7: 48  COGNITION: Overall cognitive status: Within functional limits for tasks assessed     SENSATION: Light touch: Appears intact  LUMBAR SPECIAL TESTS:  SI Compression/distraction test: both increased pain at abdomen however back pain improved with compression  FUNCTIONAL TESTS:  Functional squats - unable to complete in proper mechanics, bil knee valgus, trunk flexion and decreased descent by 75%  Sit up test - 0/3 GAIT: Decreased cadence, trunk flexion, Rt rib rounding, decreased step height bil, decreased stride length  POSTURE: rounded shoulders, forward head, increased thoracic kyphosis, posterior pelvic tilt, and flexed trunk    LUMBARAROM/PROM:  A/PROM A/PROM  eval  Flexion WFL but with pain  Extension Limited by 25%  Right lateral flexion Limited by 50%  Left lateral flexion Limited by 50%  Right rotation Limited by 50%  Left rotation Limited by 50%   (Blank rows = not tested)  LOWER EXTREMITY ROM: Bil hamstrings and adductors limited by 50%  LOWER EXTREMITY MMT:  Bil hips grossly 3+/5 PALPATION:   General: tightness at bil thoracic and lumbar paraspinals, TTP at Lt SIJ, Lt gluteal and bil piriformis   Pelvic Alignment: Rt hip hike  Abdominal: TTP at mid quadrant, and lt mid quadrant; scar present throughout midline of abdomen and poor mobility throughout                 External Perineal Exam: dryness and redness present; most redness present at introitus                             Internal  Pelvic Floor: TTP throughout bil superficial and deep layers  Patient confirms identification and approves PT to assess internal pelvic floor and treatment Yes No  emotional/communication barriers or cognitive limitation. Patient is motivated to learn. Patient understands and agrees with treatment goals and plan. PT explains patient will be examined in standing, sitting, and lying down to see how their muscles and joints work. When they are ready, they will be asked to remove their underwear so PT can examine their perineum. The patient is also given the option of providing their own chaperone as one is not provided in our facility. The patient also has the right and is explained the right to defer or refuse any part of the evaluation or treatment including the internal exam. With the patient's consent, PT will use one gloved finger to gently assess the muscles of the pelvic floor, seeing how well it contracts and relaxes and if there is muscle symmetry. After, the patient will get dressed and PT and patient will discuss exam findings and plan of care. PT and patient discuss plan of care, schedule, attendance policy and HEP activities.  PELVIC MMT:   MMT eval 08/11/25:  Vaginal 1/5 - unable to complete consistently, often bearing down 1/5, with much better techniques   Internal Anal Sphincter    External Anal Sphincter    Puborectalis    Diastasis Recti    (Blank rows = not tested)        TONE: Decreased   PROLAPSE: Possible anterior vaginal wall laxity present with coughing in hooklying   TODAY'S TREATMENT:                                                                                                                              DATE:  08/28/24: Cat cow x10 Tail wags x10 Blue band hip abduction in hooklying 2x10 Blue band hip flexion in hooklying 2x10 Lumbar roll outs x10 rt/lt/ct 1/2 foam roller thoracic extension 30s, x10 wide claps Hip flexor stretch bil 3x30s Reverse clams x15 + 30s hold on last rep each side Pelvic tilts on air disc 2x10 Hip shift x15 + 30s hold  09/11/24: Log rolling bil x5 with cues for technique and breathing mechanics  for improved pressure management with bed mobility which causes her biggest source of urinary incontinence with full sit up and valsalva (demonstrated) in the morning to get out of bed - completed this with max cues to decreased urinary incontinence and puckering of ostomy bag Nustep 5 mins L7 PT present to discuss progress  Seated transverse abdominis activation with bil shoulder horizontal abduction green band 2x10 Seated opposite hand knee ball 2x10 alt  2x10 sit to stands 9# with OHP  09/29/24: Nustep - 6 mins L7 - PT present to discuss progress Sit to stands 4# each hand with OHP 2x10 Forward bent rows 4# 2x10 Seated hip abduction blue band x20 Seated hip flexion blue band x20 Seated blue band bil shoulder  horizontal abduction x20  Farmer's carry 4# each hand 1000' x2   09/30/24: Seated opposite hand/knee ball press x20 each Seated bil shoulder horizontal abduction blue band 2x10 Forward bent rows 5# 2x10 each Sit to stands with OHP 5# each hand x20 Bridges 2x10 Blue band 2x10 hip abduction in hooklying   Blue band hip flexion alt in hooklying 2x10 Sidelying blue band at ankles ball press with hip abduction 2x10 Hooklying hip IR 2x10 each Standing marching on foam pad 2x10 Standing heel raises on foam pad 2x10  10/06/24 (patient laid on heat for back pain with supine exercises) Hooklying hip IR 2x10 each Bridges 2x10 Supine bilateral shoulder horizontal abduction with yellow band 2 x 10  Blue band hip flexion alt in hooklying 2x10 Seated opposite hand/knee ball press x20 each Sit to stands with OHP 5# each hand x20 Bilateral scapular retraction in sitting with red band 20 x  PATIENT EDUCATION:  Education details: INTERIOR AND SPATIAL DESIGNER Person educated: Patient Education method: Programmer, Multimedia, Facilities Manager, Actor cues, Verbal cues, and Handouts Education comprehension: verbalized understanding, returned demonstration, verbal cues required, tactile cues required, and needs further  education  HOME EXERCISE PROGRAM: Access Code: [email or web address] URL: [email or web address] Date: 08/27/2024 Prepared by: Darryle  Exercises - Supine Diaphragmatic Breathing  - 1 x daily - 7 x weekly - 1 sets - 10 reps - Supine Lower Trunk Rotation  - 1 x daily - 7 x weekly - 1 sets - 10 reps - Supported Teacher, Music with Pelvic Floor Relaxation  - 1 x daily - 7 x weekly - 1 sets - 3 reps - 30s holds - Seated Overhead Reach Stretch  - 1 x daily - 7 x weekly - 1 sets - 10 reps - Hooklying Clamshell with Resistance  - 1 x daily - 7 x weekly - 2 sets - 10 reps - Sit to Stand with Pelvic Floor Contraction  - 1 x daily - 7 x weekly - 2 sets - 10 reps - pelvic floor contract and holds  - 1 x daily - 7 x weekly - 2 sets - 10 reps - 5s holds - Seated Quick Flick Pelvic Floor Contractions  - 1 x daily - 7 x weekly - 2 sets - 10 reps - Seated Transversus Abdominis Bracing  - 1 x daily - 7 x weekly - 2 sets - 10 reps - Seated Shoulder Diagonal Pulls with Resistance  - 1 x daily - 7 x weekly - 2 sets - 10 reps - seated pelvic circles  - 1 x daily - 7 x weekly - 2 sets - 10 reps - Tail Wag  - 1 x daily - 7 x weekly - 1 sets - 10 reps - Hip Hinge Rock Back  - 1 x daily - 7 x weekly - 1 sets - 10 reps - Supine Bridge  - 1 x daily - 7 x weekly - 1 sets - 10 reps - overhead press  - 1 x daily - 7 x weekly - 1 sets - 10 reps - Seated Hip Flexion and External Rotation with Ankle Weight  - 1 x daily - 7 x weekly - 1 sets - 10 reps - Seated Hip Flexion March with Ankle Weights  - 1 x daily - 7 x weekly - 1 sets - 10 reps - Seated Cat Cow  - 1 x daily - 7 x weekly - 1 sets - 10 reps - Standing Row with Anchored Resistance  - 1 x daily - 7 x  weekly - 1 sets - 10 reps  ASSESSMENT:  CLINICAL IMPRESSION: Patient is a 73 y.o. female  who was seen today for physical therapy treatment for pelvic, abdominal, back pain, urinary incontinence, and increased urinary frequency. Patient had increased back pain  that became worse with standing so stayed laying down or sitting and heat on back with exercise.  Patient had less back pain at the end of treatment. She needed cues to engage her abdomen with exercises. Pt very motivated to participate and would would benefit from additional PT to further address deficits.    OBJECTIVE IMPAIRMENTS: decreased activity tolerance, decreased coordination, decreased endurance, decreased mobility, difficulty walking, decreased strength, increased fascial restrictions, increased muscle spasms, impaired flexibility, improper body mechanics, postural dysfunction, and pain.   ACTIVITY LIMITATIONS: carrying, lifting, sitting, standing, transfers, continence, and locomotion level  PARTICIPATION LIMITATIONS: meal prep, cleaning, laundry, interpersonal relationship, community activity, and occupation  PERSONAL FACTORS: Fitness, Time since onset of injury/illness/exacerbation, and 1 comorbidity: medical history are also affecting patient's functional outcome.   REHAB POTENTIAL: Good  CLINICAL DECISION MAKING: Stable/uncomplicated  EVALUATION COMPLEXITY: Low   GOALS: Goals reviewed with patient? Yes  SHORT TERM GOALS: Target date: 07/14/24  Pt to be I with HEP for carry over and continuing recommendations for improved outcomes.   Baseline: Goal status: MET  2.  Pt to be I with pressure management techniques to decreased stress at pelvic floor for improved tolerance to standing and walking without pelvic symptoms.  Baseline:  Goal status: MET  3.  Pt to be I with scar massage for improved abdominal tissue mobility to decreased urinary frequency to better tolerate traveling.  Baseline:  Goal status: MET - I with knowing how to do it but does forget to do it  4.  Pt will be independent with the knack, urge suppression technique, and double voiding in order to improve bladder habits and decrease urinary incontinence for improved skin integrity.  Baseline:  Goal  status: MET   LONG TERM GOALS: Target date: 09/16/24  Pt to be I with advanced HEP for carry over and continuing recommendations for improved outcomes.   Baseline:  Goal status: MET  2.  Pt to demonstrate improved coordination of pelvic floor and breathing mechanics with 10# squat with appropriate synergistic patterns to decrease pain and leakage at least 75% of the time for improved ability to complete a 30 minute walking without strain at pelvic floor and symptoms.    Baseline:  Goal status: MET  3. Pt to demonstrate at least 4+/5 bil hip strength for improved pelvic stability and functional squats without leakage.  Baseline:  Goal status: on going  4.  Pt to demonstrate improved core strength with ability to complete sit up test 2/3 without pain or pelvic symptoms.  Baseline:  Goal status: on going  5.  Pt to report no more than one instance of urinary incontinence in a day for improved skin integrity and confidence to leave for errands.  Baseline:  Goal status: MET  6.  Pt to report no more than 3/10 pain at hip/pelvic complex due to improved mobility and strength of surrounding structures to tolerate at least 1 hour of standing and walking without worsening pain.  Baseline:  Goal status: back at higher levels now that is sitting more at home  PLAN:  PT FREQUENCY: 2x/week  PT DURATION: 20 sessions  PLANNED INTERVENTIONS: 97110-Therapeutic exercises, 97530- Therapeutic activity, [id number]- Neuromuscular re-education, 97535- Self Care, 02859- Manual therapy, 95992- Canalith repositioning,  02886- Aquatic Therapy, [phone]- Electrical stimulation (manual), [id number]- Vasopneumatic device, [id number]- Ultrasound, [id number]- Traction (mechanical), [id number]- Ionotophoresis 4mg /ml Dexamethasone, 20560 (1-2 muscles), 20561 (3+ muscles)- Dry Needling, Patient/Family education, Balance training, Taping, Joint mobilization, Spinal mobilization, Scar mobilization, DME instructions, Cryotherapy, Moist heat, and  Biofeedback  PLAN FOR NEXT SESSION: core and hip strength, stretching hips and spine, coordination breathing and transverse abdominis activation activation ; Progress note on 17 th visit 9/115-11/11:    Channing Pereyra, PT 10/06/24 10:18 AM  [facility] [address], [address] [place], [place] [address] Phone # [phone] Fax [phone]

## 2024-10-07 ENCOUNTER — Ambulatory Visit

## 2024-10-07 DIAGNOSIS — R279 Unspecified lack of coordination: Secondary | ICD-10-CM

## 2024-10-07 DIAGNOSIS — M62838 Other muscle spasm: Secondary | ICD-10-CM

## 2024-10-07 DIAGNOSIS — M6281 Muscle weakness (generalized): Secondary | ICD-10-CM

## 2024-10-07 DIAGNOSIS — R293 Abnormal posture: Secondary | ICD-10-CM

## 2024-10-07 NOTE — Therapy (Signed)
 OUTPATIENT PHYSICAL THERAPY FEMALE PELVIC TREATMENT   Patient Name: Cindy Dudley MRN: [id number] DOB:Aug 04, 1951, 73 y.o., female Today's Date: 10/07/2024   PT End of Session - 10/07/24 1014     Visit Number 17    Date for Recertification  11/05/24    Authorization Type [employer] medicare    Authorization Time Period [employer] Approved 8 vl : 09/01/2024 - 10/27/2024, jl#[id number]    Authorization - Visit Number 5    Authorization - Number of Visits 8    Progress Note Due on Visit 17    PT Start Time 1015    PT Stop Time 1055    PT Time Calculation (min) 40 min    Activity Tolerance Patient tolerated treatment well    Behavior During Therapy WFL for tasks assessed/performed            Past Medical History:  Diagnosis Date   Anemia    Asthma    Blood transfusion    Brain tumor (HCC) 1995   Bruises easily    Bursitis    left knee   Colitis, ulcerative (HCC) 1969   Fatigue    Fibroid tumor 1982   Hearing loss    Kidney stone    Ovarian cyst 1981   Rash    Sore throat    Spasmodic dysphonia 1982   Spasmodic torticollis 2007   Ulcer    Wears glasses    Past Surgical History:  Procedure Laterality Date   ABDOMINAL HYSTERECTOMY  1982   BRAIN TUMOR EXCISION  1995   CHOLECYSTECTOMY  2016   ILEOSTOMY  1982   LARYNX SURGERY  2000   OVARIAN CYST REMOVAL     Patient Active Problem List   Diagnosis Date Noted   Mid back pain 05/16/2024   Pelvic pain 05/16/2024   Ileostomy stenosis (HCC) 09/28/2023   Irritant contact dermatitis associated with fecal stoma 08/26/2023   Ileostomy care (HCC) 08/26/2023   Titubation 08/07/2022   Bilateral hearing loss 08/07/2022   Benign meningioma of brain (HCC) 09/18/2018   Hereditary essential tremor 09/18/2018   Thyroid  nodule, uninodular, left 10/17/2011    PCP: Antonio Cyndee Jamee JONELLE, DO   REFERRING PROVIDER: Antonio Cyndee Jamee JONELLE, DO   REFERRING DIAG: R10.2 (ICD-10-CM) - Pelvic pain  THERAPY DIAG:  Muscle weakness  (generalized)  Other muscle spasm  Unspecified lack of coordination  Abnormal posture  Rationale for Evaluation and Treatment: Rehabilitation  ONSET DATE: chronic   SUBJECTIVE:  SUBJECTIVE STATEMENT: Pt states that her low back is feeling better today. She feels like every day is different and she is still having a lot of difficulty with pain and bladder. She is having more issues with waking up and leaking at night, not being able ot make it to the bathroom.    PAIN:   Are you having pain? Yes NPRS scale:1/10 10/07/24: pain level 0/10 Pain location: mid back pain  Pain type: dull Pain description: intermittent   Aggravating factors: sitting is constant, standing longer than 15 mins, housework, walking for 30 mins, changing positions Relieving factors: lying down flat  PRECAUTIONS: None  RED FLAGS: None   WEIGHT BEARING RESTRICTIONS: No  FALLS:  Has patient fallen in last 6 months? No  OCCUPATION: retired   ACTIVITY LEVEL : low  PLOF: Independent  PATIENT GOALS: to have no pain, wants to stand at least 1 hour without pain  PERTINENT HISTORY:  Coccyx pain,  pelvic floor myalgia, possibly related to nerve damage (per MD note) or surgery, multiple hernia, ostomy, ABDOMINAL HYSTERECTOMY,  Sexual abuse: No  BOWEL MOVEMENT: Uses ostomy since 96s and I  not concerns at this time  URINATION: Pain with urination: No Fully empty bladder: Yes:   Stream: Strong and Weak Urgency: No Frequency: every 30 mins if drinking water, limits due to this and can sometimes make it an hour - 1.5, 3x night Leakage: Urge to void sometimes  Pads: No  INTERCOURSE:  Ability to have vaginal penetration Yes  Pain with intercourse: none DrynessYes  Climax: unable and reports decreased sensation   Marinoff Scale: 0/3  PREGNANCY: Vaginal deliveries 3  C-section deliveries 0 Currently pregnant No  PROLAPSE: None   OBJECTIVE:  Note: Objective measures were completed at Evaluation unless otherwise noted.  DIAGNOSTIC FINDINGS:    PATIENT SURVEYS:   PFIQ-7: 95 08/06/24: PFIQ-7: 52 08/28/24: PFIQ-7: 48  COGNITION: Overall cognitive status: Within functional limits for tasks assessed     SENSATION: Light touch: Appears intact  LUMBAR SPECIAL TESTS:  SI Compression/distraction test: both increased pain at abdomen however back pain improved with compression  FUNCTIONAL TESTS:  Functional squats - unable to complete in proper mechanics, bil knee valgus, trunk flexion and decreased descent by 75%  Sit up test - 0/3 GAIT: Decreased cadence, trunk flexion, Rt rib rounding, decreased step height bil, decreased stride length  POSTURE: rounded shoulders, forward head, increased thoracic kyphosis, posterior pelvic tilt, and flexed trunk    LUMBARAROM/PROM:  A/PROM A/PROM  eval  Flexion WFL but with pain  Extension Limited by 25%  Right lateral flexion Limited by 50%  Left lateral flexion Limited by 50%  Right rotation Limited by 50%  Left rotation Limited by 50%   (Blank rows = not tested)  LOWER EXTREMITY ROM: Bil hamstrings and adductors limited by 50%  LOWER EXTREMITY MMT:  Bil hips grossly 3+/5 PALPATION:   General: tightness at bil thoracic and lumbar paraspinals, TTP at Lt SIJ, Lt gluteal and bil piriformis   Pelvic Alignment: Rt hip hike  Abdominal: TTP at mid quadrant, and lt mid quadrant; scar present throughout midline of abdomen and poor mobility throughout                 External Perineal Exam: dryness and redness present; most redness present at introitus                             Internal Pelvic  Floor: TTP throughout bil superficial and deep layers  Patient confirms identification and approves PT to assess internal pelvic floor and treatment  Yes No emotional/communication barriers or cognitive limitation. Patient is motivated to learn. Patient understands and agrees with treatment goals and plan. PT explains patient will be examined in standing, sitting, and lying down to see how their muscles and joints work. When they are ready, they will be asked to remove their underwear so PT can examine their perineum. The patient is also given the option of providing their own chaperone as one is not provided in our facility. The patient also has the right and is explained the right to defer or refuse any part of the evaluation or treatment including the internal exam. With the patient's consent, PT will use one gloved finger to gently assess the muscles of the pelvic floor, seeing how well it contracts and relaxes and if there is muscle symmetry. After, the patient will get dressed and PT and patient will discuss exam findings and plan of care. PT and patient discuss plan of care, schedule, attendance policy and HEP activities.  PELVIC MMT:   MMT eval 08/11/25:  Vaginal 1/5 - unable to complete consistently, often bearing down 1/5, with much better techniques   Internal Anal Sphincter    External Anal Sphincter    Puborectalis    Diastasis Recti    (Blank rows = not tested)        TONE: Decreased   PROLAPSE: Possible anterior vaginal wall laxity present with coughing in hooklying   TODAY'S TREATMENT:                                                                                                                              DATE:    09/30/24: Seated opposite hand/knee ball press x20 each Seated bil shoulder horizontal abduction blue band 2x10 Forward bent rows 5# 2x10 each Sit to stands with OHP 5# each hand x20 Bridges 2x10 Blue band 2x10 hip abduction in hooklying   Blue band hip flexion alt in hooklying 2x10 Sidelying blue band at ankles ball press with hip abduction 2x10 Hooklying hip IR 2x10 each Standing marching on foam pad  2x10 Standing heel raises on foam pad 2x10  10/06/24 (patient laid on heat for back pain with supine exercises) Hooklying hip IR 2x10 each Bridges 2x10 Supine bilateral shoulder horizontal abduction with yellow band 2 x 10  Blue band hip flexion alt in hooklying 2x10 Seated opposite hand/knee ball press x20 each Sit to stands with OHP 5# each hand x20 Bilateral scapular retraction in sitting with red band 20 x  10/07/24 Manual: Supine abdominal scar tissue mobilization Supine abdominal soft tissue mobilization  Supine abdominal desensitization Rt side lying: soft tissue mobilization to Lt glutes and lumbar paraspinals; trigger point release to Lt glutes Therapeutic activities: Specific urge drill use at night when waking to urinate: before sitting up, sitting up, and then immediately when standing  to help make it to toilet prior to leaking  PATIENT EDUCATION:  Education details: INTERIOR AND SPATIAL DESIGNER Person educated: Patient Education method: Explanation, Demonstration, Actor cues, Verbal cues, and Handouts Education comprehension: verbalized understanding, returned demonstration, verbal cues required, tactile cues required, and needs further education  HOME EXERCISE PROGRAM: Access Code: [email or web address] URL: [email or web address] Date: 08/27/2024 Prepared by: Darryle  Exercises - Supine Diaphragmatic Breathing  - 1 x daily - 7 x weekly - 1 sets - 10 reps - Supine Lower Trunk Rotation  - 1 x daily - 7 x weekly - 1 sets - 10 reps - Supported Teacher, Music with Pelvic Floor Relaxation  - 1 x daily - 7 x weekly - 1 sets - 3 reps - 30s holds - Seated Overhead Reach Stretch  - 1 x daily - 7 x weekly - 1 sets - 10 reps - Hooklying Clamshell with Resistance  - 1 x daily - 7 x weekly - 2 sets - 10 reps - Sit to Stand with Pelvic Floor Contraction  - 1 x daily - 7 x weekly - 2 sets - 10 reps - pelvic floor contract and holds  - 1 x daily - 7 x weekly - 2 sets - 10 reps - 5s holds -  Seated Quick Flick Pelvic Floor Contractions  - 1 x daily - 7 x weekly - 2 sets - 10 reps - Seated Transversus Abdominis Bracing  - 1 x daily - 7 x weekly - 2 sets - 10 reps - Seated Shoulder Diagonal Pulls with Resistance  - 1 x daily - 7 x weekly - 2 sets - 10 reps - seated pelvic circles  - 1 x daily - 7 x weekly - 2 sets - 10 reps - Tail Wag  - 1 x daily - 7 x weekly - 1 sets - 10 reps - Hip Hinge Rock Back  - 1 x daily - 7 x weekly - 1 sets - 10 reps - Supine Bridge  - 1 x daily - 7 x weekly - 1 sets - 10 reps - overhead press  - 1 x daily - 7 x weekly - 1 sets - 10 reps - Seated Hip Flexion and External Rotation with Ankle Weight  - 1 x daily - 7 x weekly - 1 sets - 10 reps - Seated Hip Flexion March with Ankle Weights  - 1 x daily - 7 x weekly - 1 sets - 10 reps - Seated Cat Cow  - 1 x daily - 7 x weekly - 1 sets - 10 reps - Standing Row with Anchored Resistance  - 1 x daily - 7 x weekly - 1 sets - 10 reps  ASSESSMENT:  CLINICAL IMPRESSION: Patient is a 73 y.o. female  who was seen today for physical therapy treatment for pelvic, abdominal, back pain, urinary incontinence, and increased urinary frequency. Pt doing better today with pain, but states that she has not made the progress in general she would like to have seen. She is having more leakage at night; we reviewed using urge drill at this time to help make it to the bathroom. She was ready to work on manual techniques today to begin addressing abdominal scar tissue restriction and tight musculature in low back and Lt glutes. She demonstrated high level of sensitivity and restriction, but did tolerate manual techniques well and reported no improvement in pain at end of session. Believe if we can reduce pain and scar tissue restriction in core, she would be able  to find better activation of these muscles further reducing pain. Pt very motivated to participate and would would benefit from additional PT to further address deficits.     OBJECTIVE IMPAIRMENTS: decreased activity tolerance, decreased coordination, decreased endurance, decreased mobility, difficulty walking, decreased strength, increased fascial restrictions, increased muscle spasms, impaired flexibility, improper body mechanics, postural dysfunction, and pain.   ACTIVITY LIMITATIONS: carrying, lifting, sitting, standing, transfers, continence, and locomotion level  PARTICIPATION LIMITATIONS: meal prep, cleaning, laundry, interpersonal relationship, community activity, and occupation  PERSONAL FACTORS: Fitness, Time since onset of injury/illness/exacerbation, and 1 comorbidity: medical history are also affecting patient's functional outcome.   REHAB POTENTIAL: Good  CLINICAL DECISION MAKING: Stable/uncomplicated  EVALUATION COMPLEXITY: Low   GOALS: Goals reviewed with patient? Yes  SHORT TERM GOALS: Target date: 07/14/24  Pt to be I with HEP for carry over and continuing recommendations for improved outcomes.   Baseline: Goal status: MET  2.  Pt to be I with pressure management techniques to decreased stress at pelvic floor for improved tolerance to standing and walking without pelvic symptoms.  Baseline:  Goal status: MET  3.  Pt to be I with scar massage for improved abdominal tissue mobility to decreased urinary frequency to better tolerate traveling.  Baseline:  Goal status: MET - I with knowing how to do it but does forget to do it  4.  Pt will be independent with the knack, urge suppression technique, and double voiding in order to improve bladder habits and decrease urinary incontinence for improved skin integrity.  Baseline:  Goal status: MET   LONG TERM GOALS: Target date: 09/16/24  Pt to be I with advanced HEP for carry over and continuing recommendations for improved outcomes.   Baseline:  Goal status: MET  2.  Pt to demonstrate improved coordination of pelvic floor and breathing mechanics with 10# squat with appropriate  synergistic patterns to decrease pain and leakage at least 75% of the time for improved ability to complete a 30 minute walking without strain at pelvic floor and symptoms.    Baseline:  Goal status: MET  3. Pt to demonstrate at least 4+/5 bil hip strength for improved pelvic stability and functional squats without leakage.  Baseline:  Goal status: on going  4.  Pt to demonstrate improved core strength with ability to complete sit up test 2/3 without pain or pelvic symptoms.  Baseline:  Goal status: on going  5.  Pt to report no more than one instance of urinary incontinence in a day for improved skin integrity and confidence to leave for errands.  Baseline:  Goal status: MET  6.  Pt to report no more than 3/10 pain at hip/pelvic complex due to improved mobility and strength of surrounding structures to tolerate at least 1 hour of standing and walking without worsening pain.  Baseline:  Goal status: back at higher levels now that is sitting more at home  PLAN:  PT FREQUENCY: 2x/week  PT DURATION: 20 sessions  PLANNED INTERVENTIONS: 97110-Therapeutic exercises, 97530- Therapeutic activity, 97112- Neuromuscular re-education, 97535- Self Care, [id number]- Manual therapy, [phone]- Canalith repositioning, [id number]- Aquatic Therapy, [phone]- Electrical stimulation (manual), [id number]- Vasopneumatic device, [id number]- Ultrasound, [id number]- Traction (mechanical), [id number]- Ionotophoresis 4mg /ml Dexamethasone, [id number] (1-2 muscles), [id number] (3+ muscles)- Dry Needling, Patient/Family education, Balance training, Taping, Joint mobilization, Spinal mobilization, Scar mobilization, DME instructions, Cryotherapy, Moist heat, and Biofeedback  PLAN FOR NEXT SESSION: core and hip strength, stretching hips and spine, coordination breathing and transverse abdominis activation activation ; Progress note on 17  th visit 9/115-11/11:    Josette Mares, PT, DPT11/09/2509:14 AM [facility] [address]  [address] [place], [place] [address] Phone # [phone] Fax [phone]

## 2024-10-16 ENCOUNTER — Ambulatory Visit: Payer: Self-pay | Admitting: Physical Therapy

## 2024-10-16 ENCOUNTER — Other Ambulatory Visit: Payer: Self-pay | Admitting: Family Medicine

## 2024-10-16 DIAGNOSIS — M62838 Other muscle spasm: Secondary | ICD-10-CM

## 2024-10-16 DIAGNOSIS — M6281 Muscle weakness (generalized): Secondary | ICD-10-CM

## 2024-10-16 DIAGNOSIS — R293 Abnormal posture: Secondary | ICD-10-CM

## 2024-10-16 DIAGNOSIS — R279 Unspecified lack of coordination: Secondary | ICD-10-CM

## 2024-10-16 NOTE — Therapy (Signed)
 OUTPATIENT PHYSICAL THERAPY FEMALE PELVIC TREATMENT   Patient Name: Cindy Dudley MRN: [id number] DOB:06/08/1951, 73 y.o., female Today's Date: 10/16/2024  Progress Note Reporting Period 08/11/24 to 10/07/24  See note below for Objective Data and Assessment of Progress/Goals.       PT End of Session - 10/16/24 1015     Visit Number 18    Date for Recertification  11/05/24    Authorization Type [employer] medicare    Authorization Time Period [employer] Approved 8 vl : 09/01/2024 - 10/27/2024, jl#[id number]    Authorization - Visit Number 6    Authorization - Number of Visits 8    Progress Note Due on Visit 17    PT Start Time 1015    PT Stop Time 1056    PT Time Calculation (min) 41 min    Activity Tolerance Patient tolerated treatment well    Behavior During Therapy WFL for tasks assessed/performed            Past Medical History:  Diagnosis Date   Anemia    Asthma    Blood transfusion    Brain tumor (HCC) 1995   Bruises easily    Bursitis    left knee   Colitis, ulcerative (HCC) 1969   Fatigue    Fibroid tumor 1982   Hearing loss    Kidney stone    Ovarian cyst 1981   Rash    Sore throat    Spasmodic dysphonia 1982   Spasmodic torticollis 2007   Ulcer    Wears glasses    Past Surgical History:  Procedure Laterality Date   ABDOMINAL HYSTERECTOMY  1982   BRAIN TUMOR EXCISION  1995   CHOLECYSTECTOMY  2016   ILEOSTOMY  1982   LARYNX SURGERY  2000   OVARIAN CYST REMOVAL     Patient Active Problem List   Diagnosis Date Noted   Mid back pain 05/16/2024   Pelvic pain 05/16/2024   Ileostomy stenosis (HCC) 09/28/2023   Irritant contact dermatitis associated with fecal stoma 08/26/2023   Ileostomy care (HCC) 08/26/2023   Titubation 08/07/2022   Bilateral hearing loss 08/07/2022   Benign meningioma of brain (HCC) 09/18/2018   Hereditary essential tremor 09/18/2018   Thyroid  nodule, uninodular, left 10/17/2011    PCP: Antonio Cyndee Jamee JONELLE, DO   REFERRING  PROVIDER: Antonio Cyndee Jamee JONELLE, DO   REFERRING DIAG: R10.2 (ICD-10-CM) - Pelvic pain  THERAPY DIAG:  Muscle weakness (generalized)  Other muscle spasm  Unspecified lack of coordination  Abnormal posture  Rationale for Evaluation and Treatment: Rehabilitation  ONSET DATE: chronic   SUBJECTIVE:  SUBJECTIVE STATEMENT: Pt reports she feels like she is getting better, having more good days than bad days. No longer having urinary incontinence on a regular basis, infrequent now. The belly massage felt good during but then had leakage at ostomy afterward and needed to change out bag and supplies. Has no memory of if this helped her pain at all.    PAIN:   Are you having pain? Yes NPRS scale:7/10 Pain location: lt side low abdomen  Pain type: dull Pain description: intermittent random  Aggravating factors: no cause per pt Nothing makes it better   PRECAUTIONS: None  RED FLAGS: None   WEIGHT BEARING RESTRICTIONS: No  FALLS:  Has patient fallen in last 6 months? No  OCCUPATION: retired   ACTIVITY LEVEL : low  PLOF: Independent  PATIENT GOALS: to have no pain, wants to stand at least 1 hour without pain  PERTINENT HISTORY:  Coccyx pain,  pelvic floor myalgia, possibly related to nerve damage (per MD note) or surgery, multiple hernia, ostomy, ABDOMINAL HYSTERECTOMY,  Sexual abuse: No  BOWEL MOVEMENT: Uses ostomy since 16s and I  not concerns at this time  URINATION: Pain with urination: No Fully empty bladder: Yes:   Stream: Strong and Weak Urgency: No Frequency: every 30 mins if drinking water, limits due to this and can sometimes make it an hour - 1.5, 3x night Leakage: Urge to void sometimes  Pads: No  INTERCOURSE:  Ability to have vaginal penetration Yes  Pain with  intercourse: none DrynessYes  Climax: unable and reports decreased sensation  Marinoff Scale: 0/3  PREGNANCY: Vaginal deliveries 3  C-section deliveries 0 Currently pregnant No  PROLAPSE: None   OBJECTIVE:  Note: Objective measures were completed at Evaluation unless otherwise noted.  DIAGNOSTIC FINDINGS:    PATIENT SURVEYS:   PFIQ-7: 95 08/06/24: PFIQ-7: 52 08/28/24: PFIQ-7: 48 10/16/24: PFIQ-7: 33  COGNITION: Overall cognitive status: Within functional limits for tasks assessed     SENSATION: Light touch: Appears intact  LUMBAR SPECIAL TESTS:  SI Compression/distraction test: both increased pain at abdomen however back pain improved with compression  FUNCTIONAL TESTS:  Functional squats - unable to complete in proper mechanics, bil knee valgus, trunk flexion and decreased descent by 75%  Sit up test - 0/3 GAIT: Decreased cadence, trunk flexion, Rt rib rounding, decreased step height bil, decreased stride length  POSTURE: rounded shoulders, forward head, increased thoracic kyphosis, posterior pelvic tilt, and flexed trunk    LUMBARAROM/PROM:  A/PROM A/PROM  eval  Flexion WFL but with pain  Extension Limited by 25%  Right lateral flexion Limited by 50%  Left lateral flexion Limited by 50%  Right rotation Limited by 50%  Left rotation Limited by 50%   (Blank rows = not tested)  LOWER EXTREMITY ROM: Bil hamstrings and adductors limited by 50%  LOWER EXTREMITY MMT:  Bil hips grossly 3+/5 PALPATION:   General: tightness at bil thoracic and lumbar paraspinals, TTP at Lt SIJ, Lt gluteal and bil piriformis   Pelvic Alignment: Rt hip hike  Abdominal: TTP at mid quadrant, and lt mid quadrant; scar present throughout midline of abdomen and poor mobility throughout                 External Perineal Exam: dryness and redness present; most redness present at introitus                             Internal Pelvic Floor: TTP throughout  bil superficial and deep  layers  Patient confirms identification and approves PT to assess internal pelvic floor and treatment Yes No emotional/communication barriers or cognitive limitation. Patient is motivated to learn. Patient understands and agrees with treatment goals and plan. PT explains patient will be examined in standing, sitting, and lying down to see how their muscles and joints work. When they are ready, they will be asked to remove their underwear so PT can examine their perineum. The patient is also given the option of providing their own chaperone as one is not provided in our facility. The patient also has the right and is explained the right to defer or refuse any part of the evaluation or treatment including the internal exam. With the patient's consent, PT will use one gloved finger to gently assess the muscles of the pelvic floor, seeing how well it contracts and relaxes and if there is muscle symmetry. After, the patient will get dressed and PT and patient will discuss exam findings and plan of care. PT and patient discuss plan of care, schedule, attendance policy and HEP activities.  PELVIC MMT:   MMT eval 08/11/25:  Vaginal 1/5 - unable to complete consistently, often bearing down 1/5, with much better techniques   Internal Anal Sphincter    External Anal Sphincter    Puborectalis    Diastasis Recti    (Blank rows = not tested)        TONE: Decreased   PROLAPSE: Possible anterior vaginal wall laxity present with coughing in hooklying   TODAY'S TREATMENT:                                                                                                                              DATE:  09/30/24: Seated opposite hand/knee ball press x20 each Seated bil shoulder horizontal abduction blue band 2x10 Forward bent rows 5# 2x10 each Sit to stands with OHP 5# each hand x20 Bridges 2x10 Blue band 2x10 hip abduction in hooklying   Blue band hip flexion alt in hooklying 2x10 Sidelying blue band at  ankles ball press with hip abduction 2x10 Hooklying hip IR 2x10 each Standing marching on foam pad 2x10 Standing heel raises on foam pad 2x10  10/06/24 (patient laid on heat for back pain with supine exercises) Hooklying hip IR 2x10 each Bridges 2x10 Supine bilateral shoulder horizontal abduction with yellow band 2 x 10  Blue band hip flexion alt in hooklying 2x10 Seated opposite hand/knee ball press x20 each Sit to stands with OHP 5# each hand x20 Bilateral scapular retraction in sitting with red band 20 x  10/07/24 Manual: Supine abdominal scar tissue mobilization Supine abdominal soft tissue mobilization  Supine abdominal desensitization Rt side lying: soft tissue mobilization to Lt glutes and lumbar paraspinals; trigger point release to Lt glutes Therapeutic activities: Specific urge drill use at night when waking to urinate: before sitting up, sitting up, and then immediately when standing to help make it to  toilet prior to leaking  10/16/24: Manual pt In supine - scar tissue mobilization from midline to Lt abdomen as pt reports last session with manual toward ostomy lead to increase in leakage form ostomy site. No leakage during session today. Bimanual with lt lateral abdomen and lt lateral low back with relaxation mild compression and side to side mobility.  Reviewed all goals    PATIENT EDUCATION:  Education details: INTERIOR AND SPATIAL DESIGNER Person educated: Patient Education method: Explanation, Demonstration, Actor cues, Verbal cues, and Handouts Education comprehension: verbalized understanding, returned demonstration, verbal cues required, tactile cues required, and needs further education  HOME EXERCISE PROGRAM: Access Code: [email or web address] URL: [email or web address] Date: 08/27/2024 Prepared by: Darryle  Exercises - Supine Diaphragmatic Breathing  - 1 x daily - 7 x weekly - 1 sets - 10 reps - Supine Lower Trunk Rotation  - 1 x daily - 7 x weekly - 1 sets - 10 reps -  Supported Teacher, Music with Pelvic Floor Relaxation  - 1 x daily - 7 x weekly - 1 sets - 3 reps - 30s holds - Seated Overhead Reach Stretch  - 1 x daily - 7 x weekly - 1 sets - 10 reps - Hooklying Clamshell with Resistance  - 1 x daily - 7 x weekly - 2 sets - 10 reps - Sit to Stand with Pelvic Floor Contraction  - 1 x daily - 7 x weekly - 2 sets - 10 reps - pelvic floor contract and holds  - 1 x daily - 7 x weekly - 2 sets - 10 reps - 5s holds - Seated Quick Flick Pelvic Floor Contractions  - 1 x daily - 7 x weekly - 2 sets - 10 reps - Seated Transversus Abdominis Bracing  - 1 x daily - 7 x weekly - 2 sets - 10 reps - Seated Shoulder Diagonal Pulls with Resistance  - 1 x daily - 7 x weekly - 2 sets - 10 reps - seated pelvic circles  - 1 x daily - 7 x weekly - 2 sets - 10 reps - Tail Wag  - 1 x daily - 7 x weekly - 1 sets - 10 reps - Hip Hinge Rock Back  - 1 x daily - 7 x weekly - 1 sets - 10 reps - Supine Bridge  - 1 x daily - 7 x weekly - 1 sets - 10 reps - overhead press  - 1 x daily - 7 x weekly - 1 sets - 10 reps - Seated Hip Flexion and External Rotation with Ankle Weight  - 1 x daily - 7 x weekly - 1 sets - 10 reps - Seated Hip Flexion March with Ankle Weights  - 1 x daily - 7 x weekly - 1 sets - 10 reps - Seated Cat Cow  - 1 x daily - 7 x weekly - 1 sets - 10 reps - Standing Row with Anchored Resistance  - 1 x daily - 7 x weekly - 1 sets - 10 reps  ASSESSMENT:  CLINICAL IMPRESSION: Patient is a 73 y.o. female  who was seen today for physical therapy treatment for pelvic, abdominal, back pain, urinary incontinence, and increased urinary frequency. Pt doing better today with pain, but states that she has not made the progress in general she would like to have seen. Tolerated  manual techniques well today addressing abdominal scar tissue restriction and tight musculature in low back and abdomen. She demonstrated high level of sensitivity and restriction throughout abdomen and  low back,  but did tolerate manual techniques well and reported improvement in pain at end of session. Believe if we can reduce pain and scar tissue restriction in core, she would be able to find better activation of these muscles further reducing pain. Pt had further improvement in PFIQ scoring and is progressing toward all goals. Pt very motivated to participate and would would benefit from additional PT to further address deficits.    OBJECTIVE IMPAIRMENTS: decreased activity tolerance, decreased coordination, decreased endurance, decreased mobility, difficulty walking, decreased strength, increased fascial restrictions, increased muscle spasms, impaired flexibility, improper body mechanics, postural dysfunction, and pain.   ACTIVITY LIMITATIONS: carrying, lifting, sitting, standing, transfers, continence, and locomotion level  PARTICIPATION LIMITATIONS: meal prep, cleaning, laundry, interpersonal relationship, community activity, and occupation  PERSONAL FACTORS: Fitness, Time since onset of injury/illness/exacerbation, and 1 comorbidity: medical history are also affecting patient's functional outcome.   REHAB POTENTIAL: Good  CLINICAL DECISION MAKING: Stable/uncomplicated  EVALUATION COMPLEXITY: Low   GOALS: Goals reviewed with patient? Yes  SHORT TERM GOALS: Target date: 07/14/24  Pt to be I with HEP for carry over and continuing recommendations for improved outcomes.   Baseline: Goal status: MET  2.  Pt to be I with pressure management techniques to decreased stress at pelvic floor for improved tolerance to standing and walking without pelvic symptoms.  Baseline:  Goal status: MET  3.  Pt to be I with scar massage for improved abdominal tissue mobility to decreased urinary frequency to better tolerate traveling.  Baseline:  Goal status: MET - I with knowing how to do it but does forget to do it  4.  Pt will be independent with the knack, urge suppression technique, and double voiding in  order to improve bladder habits and decrease urinary incontinence for improved skin integrity.  Baseline:  Goal status: MET   LONG TERM GOALS: Target date: 09/16/24  Pt to be I with advanced HEP for carry over and continuing recommendations for improved outcomes.   Baseline:  Goal status: MET  2.  Pt to demonstrate improved coordination of pelvic floor and breathing mechanics with 10# squat with appropriate synergistic patterns to decrease pain and leakage at least 75% of the time for improved ability to complete a 30 minute walking without strain at pelvic floor and symptoms.    Baseline:  Goal status: MET  3. Pt to demonstrate at least 4+/5 bil hip strength for improved pelvic stability and functional squats without leakage.  Baseline:  Goal status: on going for bil hip flexion (4/5) all others 4+/5  4.  Pt to demonstrate improved core strength with ability to complete sit up test 2/3 without pain or pelvic symptoms.  Baseline:  Goal status: on going 11/20  5.  Pt to report no more than one instance of urinary incontinence in a day for improved skin integrity and confidence to leave for errands.  Baseline:  Goal status: MET   6.  Pt to report no more than 3/10 pain at hip/pelvic complex due to improved mobility and strength of surrounding structures to tolerate at least 1 hour of standing and walking without worsening pain.  Baseline:  Goal status: on going - less often now but has 8/10 pain on average when having pain per pt but only lasts 1-2 hours now.  11/20  PLAN:  PT FREQUENCY: 2x/week  PT DURATION: 20 sessions  PLANNED INTERVENTIONS: 97110-Therapeutic exercises, 97530- Therapeutic activity, 97112- Neuromuscular re-education, 97535- Self Care, 02859- Manual therapy, [phone]- Canalith repositioning,  02886- Aquatic Therapy, [phone]- Electrical stimulation (manual), 02983- Vasopneumatic device, [id number]- Ultrasound, [id number]- Traction (mechanical), 02966- Ionotophoresis 4mg /ml  Dexamethasone, 20560 (1-2 muscles), 20561 (3+ muscles)- Dry Needling, Patient/Family education, Balance training, Taping, Joint mobilization, Spinal mobilization, Scar mobilization, DME instructions, Cryotherapy, Moist heat, and Biofeedback  PLAN FOR NEXT SESSION: core and hip strength, stretching hips and spine, coordination breathing and transverse abdominis activation activation    Darryle Navy, PT, DPT 10/16/2510:49 AM  [facility] [address], [address] [place], [place] [address] Phone # [phone] Fax [phone]

## 2024-10-20 ENCOUNTER — Ambulatory Visit

## 2024-10-20 DIAGNOSIS — R293 Abnormal posture: Secondary | ICD-10-CM

## 2024-10-20 DIAGNOSIS — M62838 Other muscle spasm: Secondary | ICD-10-CM

## 2024-10-20 DIAGNOSIS — R279 Unspecified lack of coordination: Secondary | ICD-10-CM

## 2024-10-20 DIAGNOSIS — M6281 Muscle weakness (generalized): Secondary | ICD-10-CM

## 2024-10-20 NOTE — Therapy (Signed)
 OUTPATIENT PHYSICAL THERAPY FEMALE PELVIC TREATMENT   Patient Name: Cindy Dudley MRN: [id number] DOB:Sep 16, 1951, 73 y.o., female Today's Date: 10/20/2024        PT End of Session - 10/20/24 1148     Visit Number 19    Date for Recertification  11/05/24    Authorization Type [employer] medicare    Authorization Time Period [employer] Approved 8 vl : 09/01/2024 - 10/27/2024, jl#[id number]    Authorization - Visit Number 7    Authorization - Number of Visits 18    PT Start Time 1148    PT Stop Time 1227    PT Time Calculation (min) 39 min    Activity Tolerance Patient tolerated treatment well    Behavior During Therapy WFL for tasks assessed/performed            Past Medical History:  Diagnosis Date   Anemia    Asthma    Blood transfusion    Brain tumor (HCC) 1995   Bruises easily    Bursitis    left knee   Colitis, ulcerative (HCC) 1969   Fatigue    Fibroid tumor 1982   Hearing loss    Kidney stone    Ovarian cyst 1981   Rash    Sore throat    Spasmodic dysphonia 1982   Spasmodic torticollis 2007   Ulcer    Wears glasses    Past Surgical History:  Procedure Laterality Date   ABDOMINAL HYSTERECTOMY  1982   BRAIN TUMOR EXCISION  1995   CHOLECYSTECTOMY  2016   ILEOSTOMY  1982   LARYNX SURGERY  2000   OVARIAN CYST REMOVAL     Patient Active Problem List   Diagnosis Date Noted   Mid back pain 05/16/2024   Pelvic pain 05/16/2024   Ileostomy stenosis (HCC) 09/28/2023   Irritant contact dermatitis associated with fecal stoma 08/26/2023   Ileostomy care (HCC) 08/26/2023   Titubation 08/07/2022   Bilateral hearing loss 08/07/2022   Benign meningioma of brain (HCC) 09/18/2018   Hereditary essential tremor 09/18/2018   Thyroid  nodule, uninodular, left 10/17/2011    PCP: Antonio Cyndee Jamee JONELLE, DO   REFERRING PROVIDER: Antonio Cyndee Jamee JONELLE, DO   REFERRING DIAG: R10.2 (ICD-10-CM) - Pelvic pain  THERAPY DIAG:  Muscle weakness (generalized)  Other muscle  spasm  Unspecified lack of coordination  Abnormal posture  Rationale for Evaluation and Treatment: Rehabilitation  ONSET DATE: chronic   SUBJECTIVE:  SUBJECTIVE STATEMENT: Pt reports that she feels like her low back and abdomen is feeling a little bit better since starting to work on massage techniques.    PAIN: 10/20/24  Are you having pain? Yes NPRS scale:/10 Pain location: lt side low abdomen  Pain type: dull Pain description: intermittent random  Aggravating factors: no cause per pt Nothing makes it better   PRECAUTIONS: None  RED FLAGS: None   WEIGHT BEARING RESTRICTIONS: No  FALLS:  Has patient fallen in last 6 months? No  OCCUPATION: retired   ACTIVITY LEVEL : low  PLOF: Independent  PATIENT GOALS: to have no pain, wants to stand at least 1 hour without pain  PERTINENT HISTORY:  Coccyx pain,  pelvic floor myalgia, possibly related to nerve damage (per MD note) or surgery, multiple hernia, ostomy, ABDOMINAL HYSTERECTOMY,  Sexual abuse: No  BOWEL MOVEMENT: Uses ostomy since 56s and I  not concerns at this time  URINATION: Pain with urination: No Fully empty bladder: Yes:   Stream: Strong and Weak Urgency: No Frequency: every 30 mins if drinking water, limits due to this and can sometimes make it an hour - 1.5, 3x night Leakage: Urge to void sometimes  Pads: No  INTERCOURSE:  Ability to have vaginal penetration Yes  Pain with intercourse: none DrynessYes  Climax: unable and reports decreased sensation  Marinoff Scale: 0/3  PREGNANCY: Vaginal deliveries 3  C-section deliveries 0 Currently pregnant No  PROLAPSE: None   OBJECTIVE:  Note: Objective measures were completed at Evaluation unless otherwise noted.  DIAGNOSTIC FINDINGS:    PATIENT SURVEYS:    PFIQ-7: 95 08/06/24: PFIQ-7: 52 08/28/24: PFIQ-7: 48 10/16/24: PFIQ-7: 33  COGNITION: Overall cognitive status: Within functional limits for tasks assessed     SENSATION: Light touch: Appears intact  LUMBAR SPECIAL TESTS:  SI Compression/distraction test: both increased pain at abdomen however back pain improved with compression  FUNCTIONAL TESTS:  Functional squats - unable to complete in proper mechanics, bil knee valgus, trunk flexion and decreased descent by 75%  Sit up test - 0/3 GAIT: Decreased cadence, trunk flexion, Rt rib rounding, decreased step height bil, decreased stride length  POSTURE: rounded shoulders, forward head, increased thoracic kyphosis, posterior pelvic tilt, and flexed trunk    LUMBARAROM/PROM:  A/PROM A/PROM  eval  Flexion WFL but with pain  Extension Limited by 25%  Right lateral flexion Limited by 50%  Left lateral flexion Limited by 50%  Right rotation Limited by 50%  Left rotation Limited by 50%   (Blank rows = not tested)  LOWER EXTREMITY ROM: Bil hamstrings and adductors limited by 50%  LOWER EXTREMITY MMT:  Bil hips grossly 3+/5 PALPATION:   General: tightness at bil thoracic and lumbar paraspinals, TTP at Lt SIJ, Lt gluteal and bil piriformis   Pelvic Alignment: Rt hip hike  Abdominal: TTP at mid quadrant, and lt mid quadrant; scar present throughout midline of abdomen and poor mobility throughout                 External Perineal Exam: dryness and redness present; most redness present at introitus                             Internal Pelvic Floor: TTP throughout bil superficial and deep layers  Patient confirms identification and approves PT to assess internal pelvic floor and treatment Yes No emotional/communication barriers or cognitive limitation. Patient is motivated to learn. Patient understands and agrees  with treatment goals and plan. PT explains patient will be examined in standing, sitting, and lying down to see how  their muscles and joints work. When they are ready, they will be asked to remove their underwear so PT can examine their perineum. The patient is also given the option of providing their own chaperone as one is not provided in our facility. The patient also has the right and is explained the right to defer or refuse any part of the evaluation or treatment including the internal exam. With the patient's consent, PT will use one gloved finger to gently assess the muscles of the pelvic floor, seeing how well it contracts and relaxes and if there is muscle symmetry. After, the patient will get dressed and PT and patient will discuss exam findings and plan of care. PT and patient discuss plan of care, schedule, attendance policy and HEP activities.  PELVIC MMT:   MMT eval 08/11/25:  Vaginal 1/5 - unable to complete consistently, often bearing down 1/5, with much better techniques   Internal Anal Sphincter    External Anal Sphincter    Puborectalis    Diastasis Recti    (Blank rows = not tested)        TONE: Decreased   PROLAPSE: Possible anterior vaginal wall laxity present with coughing in hooklying   TODAY'S TREATMENT:                                                                                                                              DATE:    10/07/24 Manual: Supine abdominal scar tissue mobilization Supine abdominal soft tissue mobilization  Supine abdominal desensitization Rt side lying: soft tissue mobilization to Lt glutes and lumbar paraspinals; trigger point release to Lt glutes Therapeutic activities: Specific urge drill use at night when waking to urinate: before sitting up, sitting up, and then immediately when standing to help make it to toilet prior to leaking  10/16/24: Manual pt In supine - scar tissue mobilization from midline to Lt abdomen as pt reports last session with manual toward ostomy lead to increase in leakage form ostomy site. No leakage during session  today. Bimanual with lt lateral abdomen and lt lateral low back with relaxation mild compression and side to side mobility.  Reviewed all goals     10/20/24 Manual: Supine scar tissue mobilization focused to just Lt side to avoid increased leaking at ostomy Neuromuscular re-education: Supine hip abduction + blue band 2 x 10 Supine hip adduction + ball squeeze 2 x 10 Supine resisted march + blue band 2 x 10 Bridges 2 x 10 Exercises: Lower trunk rotation 2 x 10   PATIENT EDUCATION:  Education details: INTERIOR AND SPATIAL DESIGNER Person educated: Patient Education method: Programmer, Multimedia, Facilities Manager, Actor cues, Verbal cues, and Handouts Education comprehension: verbalized understanding, returned demonstration, verbal cues required, tactile cues required, and needs further education  HOME EXERCISE PROGRAM: Access Code: [email or web address] URL: [email or web address] Date: 08/27/2024 Prepared by: Darryle  Exercises -  Supine Diaphragmatic Breathing  - 1 x daily - 7 x weekly - 1 sets - 10 reps - Supine Lower Trunk Rotation  - 1 x daily - 7 x weekly - 1 sets - 10 reps - Supported Teacher, Music with Pelvic Floor Relaxation  - 1 x daily - 7 x weekly - 1 sets - 3 reps - 30s holds - Seated Overhead Reach Stretch  - 1 x daily - 7 x weekly - 1 sets - 10 reps - Hooklying Clamshell with Resistance  - 1 x daily - 7 x weekly - 2 sets - 10 reps - Sit to Stand with Pelvic Floor Contraction  - 1 x daily - 7 x weekly - 2 sets - 10 reps - pelvic floor contract and holds  - 1 x daily - 7 x weekly - 2 sets - 10 reps - 5s holds - Seated Quick Flick Pelvic Floor Contractions  - 1 x daily - 7 x weekly - 2 sets - 10 reps - Seated Transversus Abdominis Bracing  - 1 x daily - 7 x weekly - 2 sets - 10 reps - Seated Shoulder Diagonal Pulls with Resistance  - 1 x daily - 7 x weekly - 2 sets - 10 reps - seated pelvic circles  - 1 x daily - 7 x weekly - 2 sets - 10 reps - Tail Wag  - 1 x daily - 7 x weekly - 1 sets - 10  reps - Hip Hinge Rock Back  - 1 x daily - 7 x weekly - 1 sets - 10 reps - Supine Bridge  - 1 x daily - 7 x weekly - 1 sets - 10 reps - overhead press  - 1 x daily - 7 x weekly - 1 sets - 10 reps - Seated Hip Flexion and External Rotation with Ankle Weight  - 1 x daily - 7 x weekly - 1 sets - 10 reps - Seated Hip Flexion March with Ankle Weights  - 1 x daily - 7 x weekly - 1 sets - 10 reps - Seated Cat Cow  - 1 x daily - 7 x weekly - 1 sets - 10 reps - Standing Row with Anchored Resistance  - 1 x daily - 7 x weekly - 1 sets - 10 reps  ASSESSMENT:  CLINICAL IMPRESSION: Patient is a 73 y.o. female  who was seen today for physical therapy treatment for pelvic, abdominal, back pain, urinary incontinence, and increased urinary frequency. Pt states that she did not have any ostomy leaking after last session and she feels like the manual techniques to her abdomen have been helping her low back feel better. We continued these techniques to just Lt abdomen with improving tenderness and some decreased restriction. We reviewed several strengthening activities with good tolerance. Pt very motivated to participate and would would benefit from additional PT to further address deficits.    OBJECTIVE IMPAIRMENTS: decreased activity tolerance, decreased coordination, decreased endurance, decreased mobility, difficulty walking, decreased strength, increased fascial restrictions, increased muscle spasms, impaired flexibility, improper body mechanics, postural dysfunction, and pain.   ACTIVITY LIMITATIONS: carrying, lifting, sitting, standing, transfers, continence, and locomotion level  PARTICIPATION LIMITATIONS: meal prep, cleaning, laundry, interpersonal relationship, community activity, and occupation  PERSONAL FACTORS: Fitness, Time since onset of injury/illness/exacerbation, and 1 comorbidity: medical history are also affecting patient's functional outcome.   REHAB POTENTIAL: Good  CLINICAL DECISION MAKING:  Stable/uncomplicated  EVALUATION COMPLEXITY: Low   GOALS: Goals reviewed with patient? Yes  SHORT TERM GOALS: Target date: 07/14/24  Pt to be I with HEP for carry over and continuing recommendations for improved outcomes.   Baseline: Goal status: MET  2.  Pt to be I with pressure management techniques to decreased stress at pelvic floor for improved tolerance to standing and walking without pelvic symptoms.  Baseline:  Goal status: MET  3.  Pt to be I with scar massage for improved abdominal tissue mobility to decreased urinary frequency to better tolerate traveling.  Baseline:  Goal status: MET - I with knowing how to do it but does forget to do it  4.  Pt will be independent with the knack, urge suppression technique, and double voiding in order to improve bladder habits and decrease urinary incontinence for improved skin integrity.  Baseline:  Goal status: MET   LONG TERM GOALS: Target date: 09/16/24  Pt to be I with advanced HEP for carry over and continuing recommendations for improved outcomes.   Baseline:  Goal status: MET  2.  Pt to demonstrate improved coordination of pelvic floor and breathing mechanics with 10# squat with appropriate synergistic patterns to decrease pain and leakage at least 75% of the time for improved ability to complete a 30 minute walking without strain at pelvic floor and symptoms.    Baseline:  Goal status: MET  3. Pt to demonstrate at least 4+/5 bil hip strength for improved pelvic stability and functional squats without leakage.  Baseline:  Goal status: on going for bil hip flexion (4/5) all others 4+/5  4.  Pt to demonstrate improved core strength with ability to complete sit up test 2/3 without pain or pelvic symptoms.  Baseline:  Goal status: on going 11/20  5.  Pt to report no more than one instance of urinary incontinence in a day for improved skin integrity and confidence to leave for errands.  Baseline:  Goal status: MET   6.   Pt to report no more than 3/10 pain at hip/pelvic complex due to improved mobility and strength of surrounding structures to tolerate at least 1 hour of standing and walking without worsening pain.  Baseline:  Goal status: on going - less often now but has 8/10 pain on average when having pain per pt but only lasts 1-2 hours now.  11/20  PLAN:  PT FREQUENCY: 2x/week  PT DURATION: 20 sessions  PLANNED INTERVENTIONS: 97110-Therapeutic exercises, 97530- Therapeutic activity, 97112- Neuromuscular re-education, 97535- Self Care, 02859- Manual therapy, [phone]- Canalith repositioning, [id number]- Aquatic Therapy, [phone]- Electrical stimulation (manual), [id number]- Vasopneumatic device, [id number]- Ultrasound, [id number]- Traction (mechanical), [id number]- Ionotophoresis 4mg /ml Dexamethasone, [id number] (1-2 muscles), [id number] (3+ muscles)- Dry Needling, Patient/Family education, Balance training, Taping, Joint mobilization, Spinal mobilization, Scar mobilization, DME instructions, Cryotherapy, Moist heat, and Biofeedback  PLAN FOR NEXT SESSION: core and hip strength, stretching hips and spine, coordination breathing and transverse abdominis activation activation    Darryle Navy, PT, DPT 10/20/2510:49 AM  [facility] [address] [place], [place] [address] Phone # [phone] Fax [phone]

## 2024-10-27 ENCOUNTER — Ambulatory Visit: Payer: Self-pay | Attending: Family Medicine | Admitting: Physical Therapy

## 2024-10-27 DIAGNOSIS — R279 Unspecified lack of coordination: Secondary | ICD-10-CM | POA: Insufficient documentation

## 2024-10-27 DIAGNOSIS — M62838 Other muscle spasm: Secondary | ICD-10-CM | POA: Insufficient documentation

## 2024-10-27 DIAGNOSIS — M6281 Muscle weakness (generalized): Secondary | ICD-10-CM | POA: Diagnosis present

## 2024-10-27 DIAGNOSIS — R293 Abnormal posture: Secondary | ICD-10-CM | POA: Diagnosis present

## 2024-10-27 NOTE — Therapy (Signed)
 OUTPATIENT PHYSICAL THERAPY FEMALE PELVIC TREATMENT   Patient Name: Cindy Dudley MRN: [id number] DOB:08-25-1951, 73 y.o., female Today's Date: 10/27/2024        PT End of Session - 10/27/24 1456     Visit Number 20    Date for Recertification  01/25/25    Authorization Type [employer] medicare    Authorization Time Period [employer] Approved 8 vl : 09/01/2024 - 10/27/2024, jl#[id number]    Authorization - Visit Number 8    Authorization - Number of Visits 8    Progress Note Due on Visit 28    PT Start Time 1452   arrival   PT Stop Time 1530    PT Time Calculation (min) 38 min    Activity Tolerance Patient tolerated treatment well    Behavior During Therapy WFL for tasks assessed/performed             Past Medical History:  Diagnosis Date   Anemia    Asthma    Blood transfusion    Brain tumor (HCC) 1995   Bruises easily    Bursitis    left knee   Colitis, ulcerative (HCC) 1969   Fatigue    Fibroid tumor 1982   Hearing loss    Kidney stone    Ovarian cyst 1981   Rash    Sore throat    Spasmodic dysphonia 1982   Spasmodic torticollis 2007   Ulcer    Wears glasses    Past Surgical History:  Procedure Laterality Date   ABDOMINAL HYSTERECTOMY  1982   BRAIN TUMOR EXCISION  1995   CHOLECYSTECTOMY  2016   ILEOSTOMY  1982   LARYNX SURGERY  2000   OVARIAN CYST REMOVAL     Patient Active Problem List   Diagnosis Date Noted   Mid back pain 05/16/2024   Pelvic pain 05/16/2024   Ileostomy stenosis (HCC) 09/28/2023   Irritant contact dermatitis associated with fecal stoma 08/26/2023   Ileostomy care (HCC) 08/26/2023   Titubation 08/07/2022   Bilateral hearing loss 08/07/2022   Benign meningioma of brain (HCC) 09/18/2018   Hereditary essential tremor 09/18/2018   Thyroid  nodule, uninodular, left 10/17/2011    PCP: Antonio Cyndee Jamee JONELLE, DO   REFERRING PROVIDER: Antonio Cyndee Jamee JONELLE, DO   REFERRING DIAG: R10.2 (ICD-10-CM) - Pelvic pain  THERAPY DIAG:  Muscle  weakness (generalized)  Abnormal posture  Unspecified lack of coordination  Other muscle spasm  Rationale for Evaluation and Treatment: Rehabilitation  ONSET DATE: chronic   SUBJECTIVE:  SUBJECTIVE STATEMENT: Got a new bed and can change position of head and feet and sleeping with head up a little bit has improved back pain some. Does report 7/10 is most average pain level, but doesn't last nearly as long now.    PAIN: 10/27/24  Are you having pain? Yes NPRS scale:7/10 Pain location: lt side low abdomen  Pain type: dull Pain description: intermittent random  Aggravating factors: no cause per pt Nothing makes it better   PRECAUTIONS: None  RED FLAGS: None   WEIGHT BEARING RESTRICTIONS: No  FALLS:  Has patient fallen in last 6 months? No  OCCUPATION: retired   ACTIVITY LEVEL : low  PLOF: Independent  PATIENT GOALS: to have no pain, wants to stand at least 1 hour without pain  PERTINENT HISTORY:  Coccyx pain,  pelvic floor myalgia, possibly related to nerve damage (per MD note) or surgery, multiple hernia, ostomy, ABDOMINAL HYSTERECTOMY,  Sexual abuse: No  BOWEL MOVEMENT: Uses ostomy since 56s and I  not concerns at this time  URINATION: Pain with urination: No Fully empty bladder: Yes:   Stream: Strong and Weak Urgency: No Frequency: every 30 mins if drinking water, limits due to this and can sometimes make it an hour - 1.5, 3x night Leakage: Urge to void sometimes  Pads: No  INTERCOURSE:  Ability to have vaginal penetration Yes  Pain with intercourse: none DrynessYes  Climax: unable and reports decreased sensation  Marinoff Scale: 0/3  PREGNANCY: Vaginal deliveries 3  C-section deliveries 0 Currently pregnant No  PROLAPSE: None   OBJECTIVE:  Note:  Objective measures were completed at Evaluation unless otherwise noted.  DIAGNOSTIC FINDINGS:    PATIENT SURVEYS:   PFIQ-7: 95 08/06/24: PFIQ-7: 52 08/28/24: PFIQ-7: 48 10/16/24: PFIQ-7: 33  COGNITION: Overall cognitive status: Within functional limits for tasks assessed     SENSATION: Light touch: Appears intact  LUMBAR SPECIAL TESTS:  SI Compression/distraction test: both increased pain at abdomen however back pain improved with compression  FUNCTIONAL TESTS:  Functional squats - unable to complete in proper mechanics, bil knee valgus, trunk flexion and decreased descent by 75%  Sit up test - 0/3 GAIT: Decreased cadence, trunk flexion, Rt rib rounding, decreased step height bil, decreased stride length  POSTURE: rounded shoulders, forward head, increased thoracic kyphosis, posterior pelvic tilt, and flexed trunk    LUMBARAROM/PROM:  A/PROM A/PROM  eval  Flexion WFL but with pain  Extension Limited by 25%  Right lateral flexion Limited by 50%  Left lateral flexion Limited by 50%  Right rotation Limited by 50%  Left rotation Limited by 50%   (Blank rows = not tested)  LOWER EXTREMITY ROM: Bil hamstrings and adductors limited by 50%  LOWER EXTREMITY MMT:  Bil hips grossly 3+/5 PALPATION:   General: tightness at bil thoracic and lumbar paraspinals, TTP at Lt SIJ, Lt gluteal and bil piriformis   Pelvic Alignment: Rt hip hike  Abdominal: TTP at mid quadrant, and lt mid quadrant; scar present throughout midline of abdomen and poor mobility throughout                 External Perineal Exam: dryness and redness present; most redness present at introitus                             Internal Pelvic Floor: TTP throughout bil superficial and deep layers  Patient confirms identification and approves PT to assess internal pelvic floor and treatment  Yes No emotional/communication barriers or cognitive limitation. Patient is motivated to learn. Patient understands and agrees  with treatment goals and plan. PT explains patient will be examined in standing, sitting, and lying down to see how their muscles and joints work. When they are ready, they will be asked to remove their underwear so PT can examine their perineum. The patient is also given the option of providing their own chaperone as one is not provided in our facility. The patient also has the right and is explained the right to defer or refuse any part of the evaluation or treatment including the internal exam. With the patient's consent, PT will use one gloved finger to gently assess the muscles of the pelvic floor, seeing how well it contracts and relaxes and if there is muscle symmetry. After, the patient will get dressed and PT and patient will discuss exam findings and plan of care. PT and patient discuss plan of care, schedule, attendance policy and HEP activities.  PELVIC MMT:   MMT eval 08/11/25:  Vaginal 1/5 - unable to complete consistently, often bearing down 1/5, with much better techniques   Internal Anal Sphincter    External Anal Sphincter    Puborectalis    Diastasis Recti    (Blank rows = not tested)        TONE: Decreased   PROLAPSE: Possible anterior vaginal wall laxity present with coughing in hooklying   TODAY'S TREATMENT:                                                                                                                              DATE:    10/07/24 Manual: Supine abdominal scar tissue mobilization Supine abdominal soft tissue mobilization  Supine abdominal desensitization Rt side lying: soft tissue mobilization to Lt glutes and lumbar paraspinals; trigger point release to Lt glutes Therapeutic activities: Specific urge drill use at night when waking to urinate: before sitting up, sitting up, and then immediately when standing to help make it to toilet prior to leaking  10/16/24: Manual pt In supine - scar tissue mobilization from midline to Lt abdomen as pt  reports last session with manual toward ostomy lead to increase in leakage form ostomy site. No leakage during session today. Bimanual with lt lateral abdomen and lt lateral low back with relaxation mild compression and side to side mobility.  Reviewed all goals     10/20/24 Manual: Supine scar tissue mobilization focused to just Lt side to avoid increased leaking at ostomy Neuromuscular re-education: Supine hip abduction + blue band 2 x 10 Supine hip adduction + ball squeeze 2 x 10 Supine resisted march + blue band 2 x 10 Bridges 2 x 10 Exercises: Lower trunk rotation 2 x 10  10/27/24: Manual pt In supine - scar tissue mobilization from midline to Lt abdomen and mid low/bladder region for improved mobility with midline scar site most restricted. No leakage during session today. Bimanual with lt lateral  abdomen and lt lateral low back with relaxation mild compression and side to side mobility. Fascial release completed in mid low and middle to Lt quadrants as well.  Lumbar rotations x10 Butterfly stretch 2x30s Hooklying hip flexion alt 2x10 with exhale Bridges x10 Moist heat at mid/low back for 10  mins x6 layers between skin and pack for decreased pain and improved tissue mobility - no adverse reactions post removal of pack and pt denied pack feeling too hot.    PATIENT EDUCATION:  Education details: INTERIOR AND SPATIAL DESIGNER Person educated: Patient Education method: Programmer, Multimedia, Facilities Manager, Actor cues, Verbal cues, and Handouts Education comprehension: verbalized understanding, returned demonstration, verbal cues required, tactile cues required, and needs further education  HOME EXERCISE PROGRAM: Access Code: [email or web address] URL: [email or web address] Date: 08/27/2024 Prepared by: Darryle  Exercises - Supine Diaphragmatic Breathing  - 1 x daily - 7 x weekly - 1 sets - 10 reps - Supine Lower Trunk Rotation  - 1 x daily - 7 x weekly - 1 sets - 10 reps - Supported Teacher, Music  with Pelvic Floor Relaxation  - 1 x daily - 7 x weekly - 1 sets - 3 reps - 30s holds - Seated Overhead Reach Stretch  - 1 x daily - 7 x weekly - 1 sets - 10 reps - Hooklying Clamshell with Resistance  - 1 x daily - 7 x weekly - 2 sets - 10 reps - Sit to Stand with Pelvic Floor Contraction  - 1 x daily - 7 x weekly - 2 sets - 10 reps - pelvic floor contract and holds  - 1 x daily - 7 x weekly - 2 sets - 10 reps - 5s holds - Seated Quick Flick Pelvic Floor Contractions  - 1 x daily - 7 x weekly - 2 sets - 10 reps - Seated Transversus Abdominis Bracing  - 1 x daily - 7 x weekly - 2 sets - 10 reps - Seated Shoulder Diagonal Pulls with Resistance  - 1 x daily - 7 x weekly - 2 sets - 10 reps - seated pelvic circles  - 1 x daily - 7 x weekly - 2 sets - 10 reps - Tail Wag  - 1 x daily - 7 x weekly - 1 sets - 10 reps - Hip Hinge Rock Back  - 1 x daily - 7 x weekly - 1 sets - 10 reps - Supine Bridge  - 1 x daily - 7 x weekly - 1 sets - 10 reps - overhead press  - 1 x daily - 7 x weekly - 1 sets - 10 reps - Seated Hip Flexion and External Rotation with Ankle Weight  - 1 x daily - 7 x weekly - 1 sets - 10 reps - Seated Hip Flexion March with Ankle Weights  - 1 x daily - 7 x weekly - 1 sets - 10 reps - Seated Cat Cow  - 1 x daily - 7 x weekly - 1 sets - 10 reps - Standing Row with Anchored Resistance  - 1 x daily - 7 x weekly - 1 sets - 10 reps  ASSESSMENT:  CLINICAL IMPRESSION: Patient is a 73 y.o. female  who was seen today for physical therapy treatment for pelvic, abdominal, back pain, urinary incontinence, and increased urinary frequency. Pt tolerated session well, does report improvement with back pain to now lasting much shorter periods of time when happening though is staying around 7/10 when bothering her. Mostly bending forward but also with just prolonged  activity as well. Pt continues to have tension in abdomen with decreased scar mobility but improving today.  Also tolerated stretching and  exercises well afterward, reported much less pain at end of session to 3/10. Pt very motivated to participate and would would benefit from additional PT to further address deficits.    OBJECTIVE IMPAIRMENTS: decreased activity tolerance, decreased coordination, decreased endurance, decreased mobility, difficulty walking, decreased strength, increased fascial restrictions, increased muscle spasms, impaired flexibility, improper body mechanics, postural dysfunction, and pain.   ACTIVITY LIMITATIONS: carrying, lifting, sitting, standing, transfers, continence, and locomotion level  PARTICIPATION LIMITATIONS: meal prep, cleaning, laundry, interpersonal relationship, community activity, and occupation  PERSONAL FACTORS: Fitness, Time since onset of injury/illness/exacerbation, and 1 comorbidity: medical history are also affecting patient's functional outcome.   REHAB POTENTIAL: Good  CLINICAL DECISION MAKING: Stable/uncomplicated  EVALUATION COMPLEXITY: Low   GOALS: Goals reviewed with patient? Yes  SHORT TERM GOALS: Target date: 07/14/24  Pt to be I with HEP for carry over and continuing recommendations for improved outcomes.   Baseline: Goal status: MET  2.  Pt to be I with pressure management techniques to decreased stress at pelvic floor for improved tolerance to standing and walking without pelvic symptoms.  Baseline:  Goal status: MET  3.  Pt to be I with scar massage for improved abdominal tissue mobility to decreased urinary frequency to better tolerate traveling.  Baseline:  Goal status: MET - I with knowing how to do it but does forget to do it  4.  Pt will be independent with the knack, urge suppression technique, and double voiding in order to improve bladder habits and decrease urinary incontinence for improved skin integrity.  Baseline:  Goal status: MET   LONG TERM GOALS: Target date: 09/16/24  Pt to be I with advanced HEP for carry over and continuing  recommendations for improved outcomes.   Baseline:  Goal status: MET  2.  Pt to demonstrate improved coordination of pelvic floor and breathing mechanics with 10# squat with appropriate synergistic patterns to decrease pain and leakage at least 75% of the time for improved ability to complete a 30 minute walking without strain at pelvic floor and symptoms.    Baseline:  Goal status: MET  3. Pt to demonstrate at least 4+/5 bil hip strength for improved pelvic stability and functional squats without leakage.  Baseline:  Goal status: on going for bil hip flexion (4/5) all others 4+/5  4.  Pt to demonstrate improved core strength with ability to complete sit up test 2/3 without pain or pelvic symptoms.  Baseline:  Goal status: on going 11/20  5.  Pt to report no more than one instance of urinary incontinence in a day for improved skin integrity and confidence to leave for errands.  Baseline:  Goal status: MET   6.  Pt to report no more than 3/10 pain at hip/pelvic complex due to improved mobility and strength of surrounding structures to tolerate at least 1 hour of standing and walking without worsening pain.  Baseline:  Goal status: on going - less often now but has 8/10 pain on average when having pain per pt but only lasts 1-2 hours now.  11/20  PLAN:  PT FREQUENCY: 2x/week  PT DURATION: 26 sessions  PLANNED INTERVENTIONS: 97110-Therapeutic exercises, 97530- Therapeutic activity, 97112- Neuromuscular re-education, 97535- Self Care, 02859- Manual therapy, [id number]- Canalith repositioning, [id number]- Aquatic Therapy, [id number]- Electrical stimulation (manual), [id number]- Vasopneumatic device, [id number]- Ultrasound, [id number]- Traction (mechanical), [id number]- Ionotophoresis 4mg /ml Dexamethasone, 20560 (1-2  muscles), 20561 (3+ muscles)- Dry Needling, Patient/Family education, Balance training, Taping, Joint mobilization, Spinal mobilization, Scar mobilization, DME instructions, Cryotherapy, Moist heat, and  Biofeedback  PLAN FOR NEXT SESSION: core and hip strength, stretching hips and spine, coordination breathing and transverse abdominis activation activation    Darryle Navy, PT, DPT 12/01/253:35 PM  [facility] [address] [place], [place] [address] Phone # [phone] Fax [phone]

## 2024-10-27 NOTE — Addendum Note (Signed)
 Addended by: Ishmel Acevedo S on: 10/27/2024 04:39 PM   Modules accepted: Orders

## 2024-11-04 ENCOUNTER — Ambulatory Visit: Admitting: Physical Therapy

## 2024-11-04 ENCOUNTER — Other Ambulatory Visit: Payer: Self-pay | Admitting: Family Medicine

## 2024-11-04 DIAGNOSIS — M62838 Other muscle spasm: Secondary | ICD-10-CM

## 2024-11-04 DIAGNOSIS — R279 Unspecified lack of coordination: Secondary | ICD-10-CM

## 2024-11-04 DIAGNOSIS — M6281 Muscle weakness (generalized): Secondary | ICD-10-CM

## 2024-11-04 DIAGNOSIS — R293 Abnormal posture: Secondary | ICD-10-CM

## 2024-11-04 NOTE — Therapy (Signed)
 OUTPATIENT PHYSICAL THERAPY FEMALE PELVIC TREATMENT   Patient Name: Cindy Dudley MRN: [id number] DOB:[birth year], 73 y.o., female Today's Date: 11/04/2024        PT End of Session - 11/04/24 0934     Visit Number 21    Date for Recertification  01/25/25    Authorization Type [employer] medicare    Authorization Time Period 10/28/2024 - 12/09/2024    Authorization - Visit Number 1    Authorization - Number of Visits 6    Progress Note Due on Visit 28    PT Start Time 0932    PT Stop Time 1014    PT Time Calculation (min) 42 min    Activity Tolerance Patient tolerated treatment well    Behavior During Therapy WFL for tasks assessed/performed             Past Medical History:  Diagnosis Date   Anemia    Asthma    Blood transfusion    Brain tumor (HCC) 1995   Bruises easily    Bursitis    left knee   Colitis, ulcerative (HCC) 1969   Fatigue    Fibroid tumor 1982   Hearing loss    Kidney stone    Ovarian cyst 1981   Rash    Sore throat    Spasmodic dysphonia 1982   Spasmodic torticollis 2007   Ulcer    Wears glasses    Past Surgical History:  Procedure Laterality Date   ABDOMINAL HYSTERECTOMY  1982   BRAIN TUMOR EXCISION  1995   CHOLECYSTECTOMY  2016   ILEOSTOMY  1982   LARYNX SURGERY  2000   OVARIAN CYST REMOVAL     Patient Active Problem List   Diagnosis Date Noted   Mid back pain 05/16/2024   Pelvic pain 05/16/2024   Ileostomy stenosis (HCC) 09/28/2023   Irritant contact dermatitis associated with fecal stoma 08/26/2023   Ileostomy care (HCC) 08/26/2023   Titubation 08/07/2022   Bilateral hearing loss 08/07/2022   Benign meningioma of brain (HCC) 09/18/2018   Hereditary essential tremor 09/18/2018   Thyroid  nodule, uninodular, left 10/17/2011    PCP: Antonio Cyndee Jamee JONELLE, DO   REFERRING PROVIDER: Antonio Cyndee Jamee JONELLE, DO   REFERRING DIAG: R10.2 (ICD-10-CM) - Pelvic pain  THERAPY DIAG:  Muscle weakness (generalized)  Other muscle  spasm  Unspecified lack of coordination  Abnormal posture  Rationale for Evaluation and Treatment: Rehabilitation  ONSET DATE: chronic   SUBJECTIVE:  SUBJECTIVE STATEMENT: Reports bending over is still the most painful thing for her but does report overall her pain is getting better. Does still happen with higher pain level when it occurs but doesn't last long now. Can now stand for dishes, vacuuming but any bending forward is instantly painful . Usually around 15 mins before needing to lean forward for support. Manual has been helping ostomy a lot now for 2 days now.    PAIN: 11/04/24 no   Are you having pain? Yes NPRS scale:7/10 Pain location: lt side low abdomen  Pain type: dull Pain description: intermittent random  Aggravating factors: no cause per pt Nothing makes it better   PRECAUTIONS: None  RED FLAGS: None   WEIGHT BEARING RESTRICTIONS: No  FALLS:  Has patient fallen in last 6 months? No  OCCUPATION: retired   ACTIVITY LEVEL : low  PLOF: Independent  PATIENT GOALS: to have no pain, wants to stand at least 1 hour without pain  PERTINENT HISTORY:  Coccyx pain,  pelvic floor myalgia, possibly related to nerve damage (per MD note) or surgery, multiple hernia, ostomy, ABDOMINAL HYSTERECTOMY,  Sexual abuse: No  BOWEL MOVEMENT: Uses ostomy since 63s and I  not concerns at this time  URINATION: Pain with urination: No Fully empty bladder: Yes:   Stream: Strong and Weak Urgency: No Frequency: every 30 mins if drinking water, limits due to this and can sometimes make it an hour - 1.5, 3x night Leakage: Urge to void sometimes  Pads: No  INTERCOURSE:  Ability to have vaginal penetration Yes  Pain with intercourse: none DrynessYes  Climax: unable and reports decreased  sensation  Marinoff Scale: 0/3  PREGNANCY: Vaginal deliveries 3  C-section deliveries 0 Currently pregnant No  PROLAPSE: None   OBJECTIVE:  Note: Objective measures were completed at Evaluation unless otherwise noted.  DIAGNOSTIC FINDINGS:    PATIENT SURVEYS:   PFIQ-7: 95 08/06/24: PFIQ-7: 52 08/28/24: PFIQ-7: 48 10/16/24: PFIQ-7: 33  COGNITION: Overall cognitive status: Within functional limits for tasks assessed     SENSATION: Light touch: Appears intact  LUMBAR SPECIAL TESTS:  SI Compression/distraction test: both increased pain at abdomen however back pain improved with compression  FUNCTIONAL TESTS:  Functional squats - unable to complete in proper mechanics, bil knee valgus, trunk flexion and decreased descent by 75%  Sit up test - 0/3 GAIT: Decreased cadence, trunk flexion, Rt rib rounding, decreased step height bil, decreased stride length  POSTURE: rounded shoulders, forward head, increased thoracic kyphosis, posterior pelvic tilt, and flexed trunk    LUMBARAROM/PROM:  A/PROM A/PROM  eval  Flexion WFL but with pain  Extension Limited by 25%  Right lateral flexion Limited by 50%  Left lateral flexion Limited by 50%  Right rotation Limited by 50%  Left rotation Limited by 50%   (Blank rows = not tested)  LOWER EXTREMITY ROM: Bil hamstrings and adductors limited by 50%  LOWER EXTREMITY MMT:  Bil hips grossly 3+/5 PALPATION:   General: tightness at bil thoracic and lumbar paraspinals, TTP at Lt SIJ, Lt gluteal and bil piriformis   Pelvic Alignment: Rt hip hike  Abdominal: TTP at mid quadrant, and lt mid quadrant; scar present throughout midline of abdomen and poor mobility throughout                 External Perineal Exam: dryness and redness present; most redness present at introitus  Internal Pelvic Floor: TTP throughout bil superficial and deep layers  Patient confirms identification and approves PT to assess  internal pelvic floor and treatment Yes No emotional/communication barriers or cognitive limitation. Patient is motivated to learn. Patient understands and agrees with treatment goals and plan. PT explains patient will be examined in standing, sitting, and lying down to see how their muscles and joints work. When they are ready, they will be asked to remove their underwear so PT can examine their perineum. The patient is also given the option of providing their own chaperone as one is not provided in our facility. The patient also has the right and is explained the right to defer or refuse any part of the evaluation or treatment including the internal exam. With the patient's consent, PT will use one gloved finger to gently assess the muscles of the pelvic floor, seeing how well it contracts and relaxes and if there is muscle symmetry. After, the patient will get dressed and PT and patient will discuss exam findings and plan of care. PT and patient discuss plan of care, schedule, attendance policy and HEP activities.  PELVIC MMT:   MMT eval 08/11/25:  Vaginal 1/5 - unable to complete consistently, often bearing down 1/5, with much better techniques   Internal Anal Sphincter    External Anal Sphincter    Puborectalis    Diastasis Recti    (Blank rows = not tested)        TONE: Decreased   PROLAPSE: Possible anterior vaginal wall laxity present with coughing in hooklying   TODAY'S TREATMENT:                                                                                                                              DATE:   10/16/24: Manual pt In supine - scar tissue mobilization from midline to Lt abdomen as pt reports last session with manual toward ostomy lead to increase in leakage form ostomy site. No leakage during session today. Bimanual with lt lateral abdomen and lt lateral low back with relaxation mild compression and side to side mobility.  Reviewed all goals      10/20/24 Manual: Supine scar tissue mobilization focused to just Lt side to avoid increased leaking at ostomy Neuromuscular re-education: Supine hip abduction + blue band 2 x 10 Supine hip adduction + ball squeeze 2 x 10 Supine resisted march + blue band 2 x 10 Bridges 2 x 10 Exercises: Lower trunk rotation 2 x 10  10/27/24: Manual pt In supine - scar tissue mobilization from midline to Lt abdomen and mid low/bladder region for improved mobility with midline scar site most restricted. No leakage during session today. Bimanual with lt lateral abdomen and lt lateral low back with relaxation mild compression and side to side mobility. Fascial release completed in mid low and middle to Lt quadrants as well.  Lumbar rotations x10 Butterfly stretch 2x30s Hooklying hip flexion alt 2x10 with exhale Kallie  x10 Moist heat at mid/low back for 10  mins x6 layers between skin and pack for decreased pain and improved tissue mobility - no adverse reactions post removal of pack and pt denied pack feeling too hot.   11/04/24: Open books with foam roller x10 each + 30s hold at end 9# sit to stands 3x10 pelvic floor activation and exhale Small power cord 2x10 trunk extension seated Standing with modified 6# trunk extension 2x10 from elevated mat table 3# hand weight x2 for x10 lateral lift and return to opposite side (rt/lt) with  forward bend at trunk and transverse abdominis activation  3# hand weight x2 for x10 forward/backwards alt with forward bend at trunk and transverse abdominis activation  Manual pt In supine - scar tissue mobilization from midline to Lt abdomen and mid low/bladder region for improved mobility with midline scar site most restricted. No leakage during session today. Bimanual with lt lateral abdomen and lt lateral low back with relaxation mild compression and side to side mobility. Fascial release completed in mid low and middle to Lt   PATIENT EDUCATION:  Education details:  INTERIOR AND SPATIAL DESIGNER Person educated: Patient Education method: Programmer, Multimedia, Demonstration, Actor cues, Verbal cues, and Handouts Education comprehension: verbalized understanding, returned demonstration, verbal cues required, tactile cues required, and needs further education  HOME EXERCISE PROGRAM: Access Code: [email or web address] URL: [email or web address] Date: 08/27/2024 Prepared by: Darryle  Exercises - Supine Diaphragmatic Breathing  - 1 x daily - 7 x weekly - 1 sets - 10 reps - Supine Lower Trunk Rotation  - 1 x daily - 7 x weekly - 1 sets - 10 reps - Supported Teacher, Music with Pelvic Floor Relaxation  - 1 x daily - 7 x weekly - 1 sets - 3 reps - 30s holds - Seated Overhead Reach Stretch  - 1 x daily - 7 x weekly - 1 sets - 10 reps - Hooklying Clamshell with Resistance  - 1 x daily - 7 x weekly - 2 sets - 10 reps - Sit to Stand with Pelvic Floor Contraction  - 1 x daily - 7 x weekly - 2 sets - 10 reps - pelvic floor contract and holds  - 1 x daily - 7 x weekly - 2 sets - 10 reps - 5s holds - Seated Quick Flick Pelvic Floor Contractions  - 1 x daily - 7 x weekly - 2 sets - 10 reps - Seated Transversus Abdominis Bracing  - 1 x daily - 7 x weekly - 2 sets - 10 reps - Seated Shoulder Diagonal Pulls with Resistance  - 1 x daily - 7 x weekly - 2 sets - 10 reps - seated pelvic circles  - 1 x daily - 7 x weekly - 2 sets - 10 reps - Tail Wag  - 1 x daily - 7 x weekly - 1 sets - 10 reps - Hip Hinge Rock Back  - 1 x daily - 7 x weekly - 1 sets - 10 reps - Supine Bridge  - 1 x daily - 7 x weekly - 1 sets - 10 reps - overhead press  - 1 x daily - 7 x weekly - 1 sets - 10 reps - Seated Hip Flexion and External Rotation with Ankle Weight  - 1 x daily - 7 x weekly - 1 sets - 10 reps - Seated Hip Flexion March with Ankle Weights  - 1 x daily - 7 x weekly - 1 sets - 10 reps - Seated Cat Cow  -  1 x daily - 7 x weekly - 1 sets - 10 reps - Standing Row with Anchored Resistance  - 1 x daily - 7 x weekly  - 1 sets - 10 reps  ASSESSMENT:  CLINICAL IMPRESSION: Patient is a 73 y.o. female  who was seen today for physical therapy treatment for pelvic, abdominal, back pain, urinary incontinence, and increased urinary frequency. Pt tolerated session well, does report improvement with back pain to now lasting much shorter periods of time when happening though is staying around 7/10 when bothering her. Mostly bending forward but also with just prolonged activity as well. Pt continues to have tension in abdomen with decreased scar mobility but improving today.  Also tolerated stretching and exercises well afterward. Continues to progress well and started more trunk extension strengthening as she is now having more mobility in abdomen. Pt very motivated to participate and would would benefit from additional PT to further address deficits.    OBJECTIVE IMPAIRMENTS: decreased activity tolerance, decreased coordination, decreased endurance, decreased mobility, difficulty walking, decreased strength, increased fascial restrictions, increased muscle spasms, impaired flexibility, improper body mechanics, postural dysfunction, and pain.   ACTIVITY LIMITATIONS: carrying, lifting, sitting, standing, transfers, continence, and locomotion level  PARTICIPATION LIMITATIONS: meal prep, cleaning, laundry, interpersonal relationship, community activity, and occupation  PERSONAL FACTORS: Fitness, Time since onset of injury/illness/exacerbation, and 1 comorbidity: medical history are also affecting patient's functional outcome.   REHAB POTENTIAL: Good  CLINICAL DECISION MAKING: Stable/uncomplicated  EVALUATION COMPLEXITY: Low   GOALS: Goals reviewed with patient? Yes  SHORT TERM GOALS: Target date: 07/14/24  Pt to be I with HEP for carry over and continuing recommendations for improved outcomes.   Baseline: Goal status: MET  2.  Pt to be I with pressure management techniques to decreased stress at pelvic floor for  improved tolerance to standing and walking without pelvic symptoms.  Baseline:  Goal status: MET  3.  Pt to be I with scar massage for improved abdominal tissue mobility to decreased urinary frequency to better tolerate traveling.  Baseline:  Goal status: MET - I with knowing how to do it but does forget to do it  4.  Pt will be independent with the knack, urge suppression technique, and double voiding in order to improve bladder habits and decrease urinary incontinence for improved skin integrity.  Baseline:  Goal status: MET   LONG TERM GOALS: Target date: 09/16/24  Pt to be I with advanced HEP for carry over and continuing recommendations for improved outcomes.   Baseline:  Goal status: MET  2.  Pt to demonstrate improved coordination of pelvic floor and breathing mechanics with 10# squat with appropriate synergistic patterns to decrease pain and leakage at least 75% of the time for improved ability to complete a 30 minute walking without strain at pelvic floor and symptoms.    Baseline:  Goal status: MET  3. Pt to demonstrate at least 4+/5 bil hip strength for improved pelvic stability and functional squats without leakage.  Baseline:  Goal status: on going for bil hip flexion (4/5) all others 4+/5  4.  Pt to demonstrate improved core strength with ability to complete sit up test 2/3 without pain or pelvic symptoms.  Baseline:  Goal status: on going 11/20  5.  Pt to report no more than one instance of urinary incontinence in a day for improved skin integrity and confidence to leave for errands.  Baseline:  Goal status: MET   6.  Pt to report no more  than 3/10 pain at hip/pelvic complex due to improved mobility and strength of surrounding structures to tolerate at least 1 hour of standing and walking without worsening pain.  Baseline:  Goal status: on going - less often now but has 8/10 pain on average when having pain per pt but only lasts 1-2 hours now.  11/20  PLAN:  PT  FREQUENCY: 2x/week  PT DURATION: 26 sessions  PLANNED INTERVENTIONS: 97110-Therapeutic exercises, 97530- Therapeutic activity, 97112- Neuromuscular re-education, 97535- Self Care, 02859- Manual therapy, [phone]- Canalith repositioning, [id number]- Aquatic Therapy, [phone]- Electrical stimulation (manual), [id number]- Vasopneumatic device, [id number]- Ultrasound, [id number]- Traction (mechanical), [id number]- Ionotophoresis 4mg /ml Dexamethasone, [id number] (1-2 muscles), [id number] (3+ muscles)- Dry Needling, Patient/Family education, Balance training, Taping, Joint mobilization, Spinal mobilization, Scar mobilization, DME instructions, Cryotherapy, Moist heat, and Biofeedback  PLAN FOR NEXT SESSION: core and hip strength, stretching hips and spine, coordination breathing and transverse abdominis activation activation    Darryle Navy, PT, DPT 11/04/2510:10 AM  [facility] [address] [place], [place] [address] Phone # [phone] Fax [phone]

## 2024-11-06 ENCOUNTER — Ambulatory Visit: Admitting: Physical Therapy

## 2024-11-06 DIAGNOSIS — M6281 Muscle weakness (generalized): Secondary | ICD-10-CM

## 2024-11-06 DIAGNOSIS — M62838 Other muscle spasm: Secondary | ICD-10-CM

## 2024-11-06 NOTE — Therapy (Signed)
 OUTPATIENT PHYSICAL THERAPY FEMALE PELVIC TREATMENT   Patient Name: Cindy Dudley MRN: [id number] DOB:Jul 09, 1951, 73 y.o., female Today's Date: 11/06/2024   PT End of Session - 11/06/24 1232     Visit Number 22    Date for Recertification  01/25/25    Authorization Type [employer] medicare    Authorization Time Period 10/28/2024 - 12/09/2024    Authorization - Visit Number 2    Authorization - Number of Visits 6    Progress Note Due on Visit 28    PT Start Time 1231    PT Stop Time 1309    PT Time Calculation (min) 38 min    Activity Tolerance Patient tolerated treatment well    Behavior During Therapy WFL for tasks assessed/performed             Past Medical History:  Diagnosis Date   Anemia    Asthma    Blood transfusion    Brain tumor (HCC) 1995   Bruises easily    Bursitis    left knee   Colitis, ulcerative (HCC) 1969   Fatigue    Fibroid tumor 1982   Hearing loss    Kidney stone    Ovarian cyst 1981   Rash    Sore throat    Spasmodic dysphonia 1982   Spasmodic torticollis 2007   Ulcer    Wears glasses    Past Surgical History:  Procedure Laterality Date   ABDOMINAL HYSTERECTOMY  1982   BRAIN TUMOR EXCISION  1995   CHOLECYSTECTOMY  2016   ILEOSTOMY  1982   LARYNX SURGERY  2000   OVARIAN CYST REMOVAL     Patient Active Problem List   Diagnosis Date Noted   Mid back pain 05/16/2024   Pelvic pain 05/16/2024   Ileostomy stenosis (HCC) 09/28/2023   Irritant contact dermatitis associated with fecal stoma 08/26/2023   Ileostomy care (HCC) 08/26/2023   Titubation 08/07/2022   Bilateral hearing loss 08/07/2022   Benign meningioma of brain (HCC) 09/18/2018   Hereditary essential tremor 09/18/2018   Thyroid  nodule, uninodular, left 10/17/2011    PCP: Antonio Cyndee Jamee JONELLE, DO   REFERRING PROVIDER: Antonio Cyndee Jamee JONELLE, DO   REFERRING DIAG: R10.2 (ICD-10-CM) - Pelvic pain  THERAPY DIAG:  Muscle weakness (generalized)  Other muscle spasm  Rationale  for Evaluation and Treatment: Rehabilitation  ONSET DATE: chronic   SUBJECTIVE:                                                                                                                                                                                           SUBJECTIVE STATEMENT: Reports pain  was pretty good yesterday but was in the car a lot. Did wake up with rib pain last night but has new adjustable bed and working on figuring out what is most comfortable with this  Ostomy output is getting much better - bags only need to be changed every other day to three days now.  Urinary incontinence is pretty good - now able to get to bathroom on time dry but urgent, isn't seeing leakage in general now. Frequency is getting better during the day time but night time is still 3x but reports she forgets about urge drill a lot and usually just wants to urinate and go back to bed reports this isnt her concern right now.  Abdominal pain greatly improving and low back pain pretty good   PAIN: 11/06/24   Are you having pain? Yes NPRS scale:2/10 Pain location: upper back  Pain type: tired ache Pain description: achy  Aggravating factors: no cause per pt Nothing makes it better   PRECAUTIONS: None  RED FLAGS: None   WEIGHT BEARING RESTRICTIONS: No  FALLS:  Has patient fallen in last 6 months? No  OCCUPATION: retired   ACTIVITY LEVEL : low  PLOF: Independent  PATIENT GOALS: to have no pain, wants to stand at least 1 hour without pain  PERTINENT HISTORY:  Coccyx pain,  pelvic floor myalgia, possibly related to nerve damage (per MD note) or surgery, multiple hernia, ostomy, ABDOMINAL HYSTERECTOMY,  Sexual abuse: No  BOWEL MOVEMENT: Uses ostomy since 25s and I  not concerns at this time  URINATION: Pain with urination: No Fully empty bladder: Yes:   Stream: Strong and Weak Urgency: No Frequency: every 30 mins if drinking water, limits due to this and can sometimes make it  an hour - 1.5, 3x night Leakage: Urge to void sometimes  Pads: No  INTERCOURSE:  Ability to have vaginal penetration Yes  Pain with intercourse: none DrynessYes  Climax: unable and reports decreased sensation  Marinoff Scale: 0/3  PREGNANCY: Vaginal deliveries 3  C-section deliveries 0 Currently pregnant No  PROLAPSE: None   OBJECTIVE:  Note: Objective measures were completed at Evaluation unless otherwise noted.  DIAGNOSTIC FINDINGS:    PATIENT SURVEYS:   PFIQ-7: 95 08/06/24: PFIQ-7: 52 08/28/24: PFIQ-7: 48 10/16/24: PFIQ-7: 33  COGNITION: Overall cognitive status: Within functional limits for tasks assessed     SENSATION: Light touch: Appears intact  LUMBAR SPECIAL TESTS:  SI Compression/distraction test: both increased pain at abdomen however back pain improved with compression  FUNCTIONAL TESTS:  Functional squats - unable to complete in proper mechanics, bil knee valgus, trunk flexion and decreased descent by 75%  Sit up test - 0/3 GAIT: Decreased cadence, trunk flexion, Rt rib rounding, decreased step height bil, decreased stride length  POSTURE: rounded shoulders, forward head, increased thoracic kyphosis, posterior pelvic tilt, and flexed trunk    LUMBARAROM/PROM:  A/PROM A/PROM  eval  Flexion WFL but with pain  Extension Limited by 25%  Right lateral flexion Limited by 50%  Left lateral flexion Limited by 50%  Right rotation Limited by 50%  Left rotation Limited by 50%   (Blank rows = not tested)  LOWER EXTREMITY ROM: Bil hamstrings and adductors limited by 50%  LOWER EXTREMITY MMT:  Bil hips grossly 3+/5 PALPATION:   General: tightness at bil thoracic and lumbar paraspinals, TTP at Lt SIJ, Lt gluteal and bil piriformis   Pelvic Alignment: Rt hip hike  Abdominal: TTP at mid quadrant, and lt mid quadrant; scar present  throughout midline of abdomen and poor mobility throughout                 External Perineal Exam: dryness and redness  present; most redness present at introitus                             Internal Pelvic Floor: TTP throughout bil superficial and deep layers  Patient confirms identification and approves PT to assess internal pelvic floor and treatment Yes No emotional/communication barriers or cognitive limitation. Patient is motivated to learn. Patient understands and agrees with treatment goals and plan. PT explains patient will be examined in standing, sitting, and lying down to see how their muscles and joints work. When they are ready, they will be asked to remove their underwear so PT can examine their perineum. The patient is also given the option of providing their own chaperone as one is not provided in our facility. The patient also has the right and is explained the right to defer or refuse any part of the evaluation or treatment including the internal exam. With the patient's consent, PT will use one gloved finger to gently assess the muscles of the pelvic floor, seeing how well it contracts and relaxes and if there is muscle symmetry. After, the patient will get dressed and PT and patient will discuss exam findings and plan of care. PT and patient discuss plan of care, schedule, attendance policy and HEP activities.  PELVIC MMT:   MMT eval 08/11/25:  Vaginal 1/5 - unable to complete consistently, often bearing down 1/5, with much better techniques   Internal Anal Sphincter    External Anal Sphincter    Puborectalis    Diastasis Recti    (Blank rows = not tested)        TONE: Decreased   PROLAPSE: Possible anterior vaginal wall laxity present with coughing in hooklying   TODAY'S TREATMENT:                                                                                                                              DATE:    10/27/24: Manual pt In supine - scar tissue mobilization from midline to Lt abdomen and mid low/bladder region for improved mobility with midline scar site most restricted. No  leakage during session today. Bimanual with lt lateral abdomen and lt lateral low back with relaxation mild compression and side to side mobility. Fascial release completed in mid low and middle to Lt quadrants as well.  Lumbar rotations x10 Butterfly stretch 2x30s Hooklying hip flexion alt 2x10 with exhale Bridges x10 Moist heat at mid/low back for 10  mins x6 layers between skin and pack for decreased pain and improved tissue mobility - no adverse reactions post removal of pack and pt denied pack feeling too hot.   11/04/24: Open books with foam roller x10 each + 30s hold at end 9# sit to  stands 3x10 pelvic floor activation and exhale Small power cord 2x10 trunk extension seated Standing with modified 6# trunk extension 2x10 from elevated mat table 3# hand weight x2 for x10 lateral lift and return to opposite side (rt/lt) with  forward bend at trunk and transverse abdominis activation  3# hand weight x2 for x10 forward/backwards alt with forward bend at trunk and transverse abdominis activation  Manual pt In supine - scar tissue mobilization from midline to Lt abdomen and mid low/bladder region for improved mobility with midline scar site most restricted. No leakage during session today. Bimanual with lt lateral abdomen and lt lateral low back with relaxation mild compression and side to side mobility. Fascial release completed in mid low and middle to Lt   11/06/24: Open books x10 each  Unilateral happy baby 3x30s  Manual pt In supine - scar tissue mobilization from midline to Lt abdomen and mid low/bladder region for improved mobility with midline scar site most restricted. No leakage during session today. Bimanual with lt lateral abdomen and lt lateral low back with relaxation mild compression and side to side mobility. Fascial release completed in mid low and middle to Lt  Sit to stands 9# 3x10  3# hand weight x2 for x10 lateral lift and return to opposite side (rt/lt) with  forward bend at  trunk and transverse abdominis activation  3# hand weight x2 for x10 forward/backwards alt with forward bend at trunk and transverse abdominis activation  Scap punches x10 - max cues Seated green band bil shoulder ER for improved posture  PATIENT EDUCATION:  Education details: INTERIOR AND SPATIAL DESIGNER Person educated: Patient Education method: Programmer, Multimedia, Facilities Manager, Actor cues, Verbal cues, and Handouts Education comprehension: verbalized understanding, returned demonstration, verbal cues required, tactile cues required, and needs further education  HOME EXERCISE PROGRAM: Access Code: [email or web address] URL: [email or web address] Date: 08/27/2024 Prepared by: Darryle  Exercises - Supine Diaphragmatic Breathing  - 1 x daily - 7 x weekly - 1 sets - 10 reps - Supine Lower Trunk Rotation  - 1 x daily - 7 x weekly - 1 sets - 10 reps - Supported Teacher, Music with Pelvic Floor Relaxation  - 1 x daily - 7 x weekly - 1 sets - 3 reps - 30s holds - Seated Overhead Reach Stretch  - 1 x daily - 7 x weekly - 1 sets - 10 reps - Hooklying Clamshell with Resistance  - 1 x daily - 7 x weekly - 2 sets - 10 reps - Sit to Stand with Pelvic Floor Contraction  - 1 x daily - 7 x weekly - 2 sets - 10 reps - pelvic floor contract and holds  - 1 x daily - 7 x weekly - 2 sets - 10 reps - 5s holds - Seated Quick Flick Pelvic Floor Contractions  - 1 x daily - 7 x weekly - 2 sets - 10 reps - Seated Transversus Abdominis Bracing  - 1 x daily - 7 x weekly - 2 sets - 10 reps - Seated Shoulder Diagonal Pulls with Resistance  - 1 x daily - 7 x weekly - 2 sets - 10 reps - seated pelvic circles  - 1 x daily - 7 x weekly - 2 sets - 10 reps - Tail Wag  - 1 x daily - 7 x weekly - 1 sets - 10 reps - Hip Hinge Rock Back  - 1 x daily - 7 x weekly - 1 sets - 10 reps - Supine Bridge  - 1 x daily -  7 x weekly - 1 sets - 10 reps - overhead press  - 1 x daily - 7 x weekly - 1 sets - 10 reps - Seated Hip Flexion and External Rotation  with Ankle Weight  - 1 x daily - 7 x weekly - 1 sets - 10 reps - Seated Hip Flexion March with Ankle Weights  - 1 x daily - 7 x weekly - 1 sets - 10 reps - Seated Cat Cow  - 1 x daily - 7 x weekly - 1 sets - 10 reps - Standing Row with Anchored Resistance  - 1 x daily - 7 x weekly - 1 sets - 10 reps  ASSESSMENT:  CLINICAL IMPRESSION: Patient is a 73 y.o. female  who was seen today for physical therapy treatment for pelvic, abdominal, back pain, urinary incontinence, and increased urinary frequency. Pt tolerated session well, does report improvement with back pain to now lasting much shorter periods of time. Continues to progress well and started more trunk extension strengthening as she is now having more mobility in abdomen, no pain with forward bending with this. Pt very motivated to participate and would would benefit from additional PT to further address deficits.    OBJECTIVE IMPAIRMENTS: decreased activity tolerance, decreased coordination, decreased endurance, decreased mobility, difficulty walking, decreased strength, increased fascial restrictions, increased muscle spasms, impaired flexibility, improper body mechanics, postural dysfunction, and pain.   ACTIVITY LIMITATIONS: carrying, lifting, sitting, standing, transfers, continence, and locomotion level  PARTICIPATION LIMITATIONS: meal prep, cleaning, laundry, interpersonal relationship, community activity, and occupation  PERSONAL FACTORS: Fitness, Time since onset of injury/illness/exacerbation, and 1 comorbidity: medical history are also affecting patient's functional outcome.   REHAB POTENTIAL: Good  CLINICAL DECISION MAKING: Stable/uncomplicated  EVALUATION COMPLEXITY: Low   GOALS: Goals reviewed with patient? Yes  SHORT TERM GOALS: Target date: 07/14/24  Pt to be I with HEP for carry over and continuing recommendations for improved outcomes.   Baseline: Goal status: MET  2.  Pt to be I with pressure management  techniques to decreased stress at pelvic floor for improved tolerance to standing and walking without pelvic symptoms.  Baseline:  Goal status: MET  3.  Pt to be I with scar massage for improved abdominal tissue mobility to decreased urinary frequency to better tolerate traveling.  Baseline:  Goal status: MET - I with knowing how to do it but does forget to do it  4.  Pt will be independent with the knack, urge suppression technique, and double voiding in order to improve bladder habits and decrease urinary incontinence for improved skin integrity.  Baseline:  Goal status: MET   LONG TERM GOALS: Target date: 09/16/24  Pt to be I with advanced HEP for carry over and continuing recommendations for improved outcomes.   Baseline:  Goal status: MET  2.  Pt to demonstrate improved coordination of pelvic floor and breathing mechanics with 10# squat with appropriate synergistic patterns to decrease pain and leakage at least 75% of the time for improved ability to complete a 30 minute walking without strain at pelvic floor and symptoms.    Baseline:  Goal status: MET  3. Pt to demonstrate at least 4+/5 bil hip strength for improved pelvic stability and functional squats without leakage.  Baseline:  Goal status: on going for bil hip flexion (4/5) all others 4+/5  4.  Pt to demonstrate improved core strength with ability to complete sit up test 2/3 without pain or pelvic symptoms.  Baseline:  Goal status:  on going 11/20  5.  Pt to report no more than one instance of urinary incontinence in a day for improved skin integrity and confidence to leave for errands.  Baseline:  Goal status: MET   6.  Pt to report no more than 3/10 pain at hip/pelvic complex due to improved mobility and strength of surrounding structures to tolerate at least 1 hour of standing and walking without worsening pain.  Baseline:  Goal status: on going - less often now but has 8/10 pain on average when having pain per pt  but only lasts 1-2 hours now.  11/20  PLAN:  PT FREQUENCY: 2x/week  PT DURATION: 26 sessions  PLANNED INTERVENTIONS: 97110-Therapeutic exercises, 97530- Therapeutic activity, 97112- Neuromuscular re-education, 97535- Self Care, [id number]- Manual therapy, [phone]- Canalith repositioning, [id number]- Aquatic Therapy, [phone]- Electrical stimulation (manual), [id number]- Vasopneumatic device, [id number]- Ultrasound, [id number]- Traction (mechanical), [id number]- Ionotophoresis 4mg /ml Dexamethasone, [id number] (1-2 muscles), [id number] (3+ muscles)- Dry Needling, Patient/Family education, Balance training, Taping, Joint mobilization, Spinal mobilization, Scar mobilization, DME instructions, Cryotherapy, Moist heat, and Biofeedback  PLAN FOR NEXT SESSION: core and hip strength, stretching hips and spine, coordination breathing and transverse abdominis activation activation    Darryle Navy, PT, DPT 12/11/20253:29 PM  [facility] [address], [address] [place], [place] [address] Phone # [phone] Fax [phone]

## 2024-11-10 ENCOUNTER — Ambulatory Visit: Admitting: Physical Therapy

## 2024-11-11 ENCOUNTER — Ambulatory Visit: Admitting: Physical Therapy

## 2024-11-11 DIAGNOSIS — M6281 Muscle weakness (generalized): Secondary | ICD-10-CM | POA: Diagnosis not present

## 2024-11-11 DIAGNOSIS — R293 Abnormal posture: Secondary | ICD-10-CM

## 2024-11-11 DIAGNOSIS — M62838 Other muscle spasm: Secondary | ICD-10-CM

## 2024-11-11 NOTE — Therapy (Signed)
 OUTPATIENT PHYSICAL THERAPY FEMALE PELVIC TREATMENT   Patient Name: Cindy Dudley MRN: [id number] DOB:1951-07-31, 73 y.o., female Today's Date: 11/11/2024   PT End of Session - 11/11/24 1102     Visit Number 23    Date for Recertification  01/25/25    Authorization Type [employer] medicare    Authorization Time Period 10/28/2024 - 12/09/2024    Authorization - Visit Number 3    Authorization - Number of Visits 6    Progress Note Due on Visit 28    PT Start Time 1100    PT Stop Time 1143    PT Time Calculation (min) 43 min    Activity Tolerance Patient tolerated treatment well    Behavior During Therapy WFL for tasks assessed/performed              Past Medical History:  Diagnosis Date   Anemia    Asthma    Blood transfusion    Brain tumor (HCC) 1995   Bruises easily    Bursitis    left knee   Colitis, ulcerative (HCC) 1969   Fatigue    Fibroid tumor 1982   Hearing loss    Kidney stone    Ovarian cyst 1981   Rash    Sore throat    Spasmodic dysphonia 1982   Spasmodic torticollis 2007   Ulcer    Wears glasses    Past Surgical History:  Procedure Laterality Date   ABDOMINAL HYSTERECTOMY  1982   BRAIN TUMOR EXCISION  1995   CHOLECYSTECTOMY  2016   ILEOSTOMY  1982   LARYNX SURGERY  2000   OVARIAN CYST REMOVAL     Patient Active Problem List   Diagnosis Date Noted   Mid back pain 05/16/2024   Pelvic pain 05/16/2024   Ileostomy stenosis (HCC) 09/28/2023   Irritant contact dermatitis associated with fecal stoma 08/26/2023   Ileostomy care (HCC) 08/26/2023   Titubation 08/07/2022   Bilateral hearing loss 08/07/2022   Benign meningioma of brain (HCC) 09/18/2018   Hereditary essential tremor 09/18/2018   Thyroid  nodule, uninodular, left 10/17/2011    PCP: Antonio Cyndee Jamee JONELLE, DO   REFERRING PROVIDER: Antonio Cyndee Jamee JONELLE, DO   REFERRING DIAG: R10.2 (ICD-10-CM) - Pelvic pain  THERAPY DIAG:  Muscle weakness (generalized)  Other muscle spasm  Abnormal  posture  Rationale for Evaluation and Treatment: Rehabilitation  ONSET DATE: chronic   SUBJECTIVE:  SUBJECTIVE STATEMENT: Reports was able to clean closets over the weekend, 7/10 after this but did relieve with heating pad. Abdominal pain is getting better only painful if she pushes on it now. Reports back pain was a little worse with increased reps with shoulder retraction based exercises but with lower reps at home ok.    PAIN: 11/11/24   Are you having pain? Yes NPRS scale:2/10 Pain location: bil lower rib pain  Pain type: tired ache Pain description: achy  Aggravating factors: no cause per pt Nothing makes it better   PRECAUTIONS: None  RED FLAGS: None   WEIGHT BEARING RESTRICTIONS: No  FALLS:  Has patient fallen in last 6 months? No  OCCUPATION: retired   ACTIVITY LEVEL : low  PLOF: Independent  PATIENT GOALS: to have no pain, wants to stand at least 1 hour without pain  PERTINENT HISTORY:  Coccyx pain,  pelvic floor myalgia, possibly related to nerve damage (per MD note) or surgery, multiple hernia, ostomy, ABDOMINAL HYSTERECTOMY,  Sexual abuse: No  BOWEL MOVEMENT: Uses ostomy since 89s and I  not concerns at this time  URINATION: Pain with urination: No Fully empty bladder: Yes:   Stream: Strong and Weak Urgency: No Frequency: every 30 mins if drinking water, limits due to this and can sometimes make it an hour - 1.5, 3x night Leakage: Urge to void sometimes  Pads: No  INTERCOURSE:  Ability to have vaginal penetration Yes  Pain with intercourse: none DrynessYes  Climax: unable and reports decreased sensation  Marinoff Scale: 0/3  PREGNANCY: Vaginal deliveries 3  C-section deliveries 0 Currently pregnant No  PROLAPSE: None   OBJECTIVE:  Note: Objective  measures were completed at Evaluation unless otherwise noted.  DIAGNOSTIC FINDINGS:    PATIENT SURVEYS:   PFIQ-7: 95 08/06/24: PFIQ-7: 52 08/28/24: PFIQ-7: 48 10/16/24: PFIQ-7: 33  COGNITION: Overall cognitive status: Within functional limits for tasks assessed     SENSATION: Light touch: Appears intact  LUMBAR SPECIAL TESTS:  SI Compression/distraction test: both increased pain at abdomen however back pain improved with compression  FUNCTIONAL TESTS:  Functional squats - unable to complete in proper mechanics, bil knee valgus, trunk flexion and decreased descent by 75%  Sit up test - 0/3 GAIT: Decreased cadence, trunk flexion, Rt rib rounding, decreased step height bil, decreased stride length  POSTURE: rounded shoulders, forward head, increased thoracic kyphosis, posterior pelvic tilt, and flexed trunk    LUMBARAROM/PROM:  A/PROM A/PROM  eval  Flexion WFL but with pain  Extension Limited by 25%  Right lateral flexion Limited by 50%  Left lateral flexion Limited by 50%  Right rotation Limited by 50%  Left rotation Limited by 50%   (Blank rows = not tested)  LOWER EXTREMITY ROM: Bil hamstrings and adductors limited by 50%  LOWER EXTREMITY MMT:  Bil hips grossly 3+/5 PALPATION:   General: tightness at bil thoracic and lumbar paraspinals, TTP at Lt SIJ, Lt gluteal and bil piriformis   Pelvic Alignment: Rt hip hike  Abdominal: TTP at mid quadrant, and lt mid quadrant; scar present throughout midline of abdomen and poor mobility throughout                 External Perineal Exam: dryness and redness present; most redness present at introitus                             Internal Pelvic Floor: TTP throughout bil superficial and deep layers  Patient confirms identification and approves PT to assess internal pelvic floor and treatment Yes No emotional/communication barriers or cognitive limitation. Patient is motivated to learn. Patient understands and agrees with  treatment goals and plan. PT explains patient will be examined in standing, sitting, and lying down to see how their muscles and joints work. When they are ready, they will be asked to remove their underwear so PT can examine their perineum. The patient is also given the option of providing their own chaperone as one is not provided in our facility. The patient also has the right and is explained the right to defer or refuse any part of the evaluation or treatment including the internal exam. With the patient's consent, PT will use one gloved finger to gently assess the muscles of the pelvic floor, seeing how well it contracts and relaxes and if there is muscle symmetry. After, the patient will get dressed and PT and patient will discuss exam findings and plan of care. PT and patient discuss plan of care, schedule, attendance policy and HEP activities.  PELVIC MMT:   MMT eval 08/11/25:  Vaginal 1/5 - unable to complete consistently, often bearing down 1/5, with much better techniques   Internal Anal Sphincter    External Anal Sphincter    Puborectalis    Diastasis Recti    (Blank rows = not tested)        TONE: Decreased   PROLAPSE: Possible anterior vaginal wall laxity present with coughing in hooklying   TODAY'S TREATMENT:                                                                                                                              DATE:  11/04/24: Open books with foam roller x10 each + 30s hold at end 9# sit to stands 3x10 pelvic floor activation and exhale Small power cord 2x10 trunk extension seated Standing with modified 6# trunk extension 2x10 from elevated mat table 3# hand weight x2 for x10 lateral lift and return to opposite side (rt/lt) with  forward bend at trunk and transverse abdominis activation  3# hand weight x2 for x10 forward/backwards alt with forward bend at trunk and transverse abdominis activation  Manual pt In supine - scar tissue mobilization from  midline to Lt abdomen and mid low/bladder region for improved mobility with midline scar site most restricted. No leakage during session today. Bimanual with lt lateral abdomen and lt lateral low back with relaxation mild compression and side to side mobility. Fascial release completed in mid low and middle to Lt   11/06/24: Open books x10 each  Unilateral happy baby 3x30s  Manual pt In supine - scar tissue mobilization from midline to Lt abdomen and mid low/bladder region for improved mobility with midline scar site most restricted. No leakage during session today. Bimanual with lt lateral abdomen and lt lateral low back with relaxation mild compression and side to side mobility. Fascial release completed in mid  low and middle to Lt  Sit to stands 9# 3x10  3# hand weight x2 for x10 lateral lift and return to opposite side (rt/lt) with  forward bend at trunk and transverse abdominis activation  3# hand weight x2 for x10 forward/backwards alt with forward bend at trunk and transverse abdominis activation  Scap punches x10 - max cues Seated green band bil shoulder ER for improved posture  11/11/24 Standing bil tricep extension blue band 2x10 Standing bent rows 3# x10 each Seated green band bil shoulder horizontal abduction 2x10 3# hand weight x2 for x10 lateral lift and return to opposite side (rt/lt) with  forward bend at trunk and transverse abdominis activation  Green band standing shoulder walks 3x5 rt and lt Seated cat/cow x10 Seated green band D1 flexion x10 each omen and mid low/bladder region for improved mobility with midline scar site most restricted. No leakage during session today. Bimanual with lt lateral abdomen and lt lateral low back with relaxation mild compression and side to side mobility. Fascial release completed in mid low and middle to Lt   PATIENT EDUCATION:  Education details: INTERIOR AND SPATIAL DESIGNER Person educated: Patient Education method: Programmer, Multimedia, Demonstration, Actor cues,  Verbal cues, and Handouts Education comprehension: verbalized understanding, returned demonstration, verbal cues required, tactile cues required, and needs further education  HOME EXERCISE PROGRAM: Access Code: [email or web address] URL: [email or web address] Date: 08/27/2024 Prepared by: Darryle  Exercises - Supine Diaphragmatic Breathing  - 1 x daily - 7 x weekly - 1 sets - 10 reps - Supine Lower Trunk Rotation  - 1 x daily - 7 x weekly - 1 sets - 10 reps - Supported Teacher, Music with Pelvic Floor Relaxation  - 1 x daily - 7 x weekly - 1 sets - 3 reps - 30s holds - Seated Overhead Reach Stretch  - 1 x daily - 7 x weekly - 1 sets - 10 reps - Hooklying Clamshell with Resistance  - 1 x daily - 7 x weekly - 2 sets - 10 reps - Sit to Stand with Pelvic Floor Contraction  - 1 x daily - 7 x weekly - 2 sets - 10 reps - pelvic floor contract and holds  - 1 x daily - 7 x weekly - 2 sets - 10 reps - 5s holds - Seated Quick Flick Pelvic Floor Contractions  - 1 x daily - 7 x weekly - 2 sets - 10 reps - Seated Transversus Abdominis Bracing  - 1 x daily - 7 x weekly - 2 sets - 10 reps - Seated Shoulder Diagonal Pulls with Resistance  - 1 x daily - 7 x weekly - 2 sets - 10 reps - seated pelvic circles  - 1 x daily - 7 x weekly - 2 sets - 10 reps - Tail Wag  - 1 x daily - 7 x weekly - 1 sets - 10 reps - Hip Hinge Rock Back  - 1 x daily - 7 x weekly - 1 sets - 10 reps - Supine Bridge  - 1 x daily - 7 x weekly - 1 sets - 10 reps - overhead press  - 1 x daily - 7 x weekly - 1 sets - 10 reps - Seated Hip Flexion and External Rotation with Ankle Weight  - 1 x daily - 7 x weekly - 1 sets - 10 reps - Seated Hip Flexion March with Ankle Weights  - 1 x daily - 7 x weekly - 1 sets - 10 reps - Seated Cat Cow  -  1 x daily - 7 x weekly - 1 sets - 10 reps - Standing Row with Anchored Resistance  - 1 x daily - 7 x weekly - 1 sets - 10 reps  ASSESSMENT:  CLINICAL IMPRESSION: Patient is a 73 y.o. female  who was  seen today for physical therapy treatment for pelvic, abdominal, back pain, urinary incontinence, and increased urinary frequency. Pt tolerated session well, does report improvement with back pain to now lasting much shorter periods of time. Continues to progress well and started more trunk extension strengthening as she is now having more mobility in abdomen, no pain with forward bending with this. Pt very motivated to participate and would would benefit from additional PT to further address deficits.    OBJECTIVE IMPAIRMENTS: decreased activity tolerance, decreased coordination, decreased endurance, decreased mobility, difficulty walking, decreased strength, increased fascial restrictions, increased muscle spasms, impaired flexibility, improper body mechanics, postural dysfunction, and pain.   ACTIVITY LIMITATIONS: carrying, lifting, sitting, standing, transfers, continence, and locomotion level  PARTICIPATION LIMITATIONS: meal prep, cleaning, laundry, interpersonal relationship, community activity, and occupation  PERSONAL FACTORS: Fitness, Time since onset of injury/illness/exacerbation, and 1 comorbidity: medical history are also affecting patient's functional outcome.   REHAB POTENTIAL: Good  CLINICAL DECISION MAKING: Stable/uncomplicated  EVALUATION COMPLEXITY: Low   GOALS: Goals reviewed with patient? Yes  SHORT TERM GOALS: Target date: 07/14/24  Pt to be I with HEP for carry over and continuing recommendations for improved outcomes.   Baseline: Goal status: MET  2.  Pt to be I with pressure management techniques to decreased stress at pelvic floor for improved tolerance to standing and walking without pelvic symptoms.  Baseline:  Goal status: MET  3.  Pt to be I with scar massage for improved abdominal tissue mobility to decreased urinary frequency to better tolerate traveling.  Baseline:  Goal status: MET - I with knowing how to do it but does forget to do it  4.  Pt will  be independent with the knack, urge suppression technique, and double voiding in order to improve bladder habits and decrease urinary incontinence for improved skin integrity.  Baseline:  Goal status: MET   LONG TERM GOALS: Target date: 09/16/24  Pt to be I with advanced HEP for carry over and continuing recommendations for improved outcomes.   Baseline:  Goal status: MET  2.  Pt to demonstrate improved coordination of pelvic floor and breathing mechanics with 10# squat with appropriate synergistic patterns to decrease pain and leakage at least 75% of the time for improved ability to complete a 30 minute walking without strain at pelvic floor and symptoms.    Baseline:  Goal status: MET  3. Pt to demonstrate at least 4+/5 bil hip strength for improved pelvic stability and functional squats without leakage.  Baseline:  Goal status: on going for bil hip flexion (4/5) all others 4+/5  4.  Pt to demonstrate improved core strength with ability to complete sit up test 2/3 without pain or pelvic symptoms.  Baseline:  Goal status: on going 11/20  5.  Pt to report no more than one instance of urinary incontinence in a day for improved skin integrity and confidence to leave for errands.  Baseline:  Goal status: MET   6.  Pt to report no more than 3/10 pain at hip/pelvic complex due to improved mobility and strength of surrounding structures to tolerate at least 1 hour of standing and walking without worsening pain.  Baseline:  Goal status: on going -  less often now but has 8/10 pain on average when having pain per pt but only lasts 1-2 hours now.  11/20  PLAN:  PT FREQUENCY: 2x/week  PT DURATION: 26 sessions  PLANNED INTERVENTIONS: 97110-Therapeutic exercises, 97530- Therapeutic activity, 97112- Neuromuscular re-education, 97535- Self Care, 02859- Manual therapy, [id number]- Canalith repositioning, [id number]- Aquatic Therapy, [phone]- Electrical stimulation (manual), [id number]- Vasopneumatic device,  [id number]- Ultrasound, [id number]- Traction (mechanical), [id number]- Ionotophoresis 4mg /ml Dexamethasone, [id number] (1-2 muscles), [id number] (3+ muscles)- Dry Needling, Patient/Family education, Balance training, Taping, Joint mobilization, Spinal mobilization, Scar mobilization, DME instructions, Cryotherapy, Moist heat, and Biofeedback  PLAN FOR NEXT SESSION: core and hip strength, stretching hips and spine, coordination breathing and transverse abdominis activation activation    Darryle Navy, PT, DPT 12/16/202512:02 PM  [facility] [address], [address] [place], [place] [address] Phone # [phone] Fax [phone]

## 2024-11-12 ENCOUNTER — Ambulatory Visit: Admitting: Physical Therapy

## 2024-11-18 ENCOUNTER — Ambulatory Visit: Admitting: Physical Therapy

## 2024-11-18 DIAGNOSIS — M6281 Muscle weakness (generalized): Secondary | ICD-10-CM

## 2024-11-18 DIAGNOSIS — R279 Unspecified lack of coordination: Secondary | ICD-10-CM

## 2024-11-18 DIAGNOSIS — R293 Abnormal posture: Secondary | ICD-10-CM

## 2024-11-18 DIAGNOSIS — M62838 Other muscle spasm: Secondary | ICD-10-CM

## 2024-11-18 NOTE — Therapy (Signed)
 " OUTPATIENT PHYSICAL THERAPY FEMALE PELVIC TREATMENT   Patient Name: Cindy Dudley MRN: [id number] DOB:Apr 19, 1951, 73 y.o., female Today's Date: 11/18/2024   PT End of Session - 11/18/24 1538     Visit Number 24    Date for Recertification  01/25/25    Authorization Type [employer] medicare    Authorization Time Period 10/28/2024 - 12/09/2024    Authorization - Visit Number 4    Authorization - Number of Visits 6    Progress Note Due on Visit 28    PT Start Time 1534    PT Stop Time 1614    PT Time Calculation (min) 40 min    Activity Tolerance Patient tolerated treatment well    Behavior During Therapy WFL for tasks assessed/performed              Past Medical History:  Diagnosis Date   Anemia    Asthma    Blood transfusion    Brain tumor (HCC) 1995   Bruises easily    Bursitis    left knee   Colitis, ulcerative (HCC) 1969   Fatigue    Fibroid tumor 1982   Hearing loss    Kidney stone    Ovarian cyst 1981   Rash    Sore throat    Spasmodic dysphonia 1982   Spasmodic torticollis 2007   Ulcer    Wears glasses    Past Surgical History:  Procedure Laterality Date   ABDOMINAL HYSTERECTOMY  1982   BRAIN TUMOR EXCISION  1995   CHOLECYSTECTOMY  2016   ILEOSTOMY  1982   LARYNX SURGERY  2000   OVARIAN CYST REMOVAL     Patient Active Problem List   Diagnosis Date Noted   Mid back pain 05/16/2024   Pelvic pain 05/16/2024   Ileostomy stenosis (HCC) 09/28/2023   Irritant contact dermatitis associated with fecal stoma 08/26/2023   Ileostomy care (HCC) 08/26/2023   Titubation 08/07/2022   Bilateral hearing loss 08/07/2022   Benign meningioma of brain (HCC) 09/18/2018   Hereditary essential tremor 09/18/2018   Thyroid  nodule, uninodular, left 10/17/2011    PCP: Antonio Cyndee Jamee JONELLE, DO   REFERRING PROVIDER: Antonio Cyndee Jamee JONELLE, DO   REFERRING DIAG: R10.2 (ICD-10-CM) - Pelvic pain  THERAPY DIAG:  Muscle weakness (generalized)  Other muscle spasm  Abnormal  posture  Unspecified lack of coordination  Rationale for Evaluation and Treatment: Rehabilitation  ONSET DATE: chronic   SUBJECTIVE:  SUBJECTIVE STATEMENT: Reports bending over has gotten better since last appointment. Hasn't noticed abdominal pain until she attempted to massage it.     PAIN: 11/18/24 no   Are you having pain? Yes NPRS scale:2/10 Pain location: bil lower rib pain  Pain type: tired ache Pain description: achy  Aggravating factors: no cause per pt Nothing makes it better   PRECAUTIONS: None  RED FLAGS: None   WEIGHT BEARING RESTRICTIONS: No  FALLS:  Has patient fallen in last 6 months? No  OCCUPATION: retired   ACTIVITY LEVEL : low  PLOF: Independent  PATIENT GOALS: to have no pain, wants to stand at least 1 hour without pain  PERTINENT HISTORY:  Coccyx pain,  pelvic floor myalgia, possibly related to nerve damage (per MD note) or surgery, multiple hernia, ostomy, ABDOMINAL HYSTERECTOMY,  Sexual abuse: No  BOWEL MOVEMENT: Uses ostomy since 26s and I  not concerns at this time  URINATION: Pain with urination: No Fully empty bladder: Yes:   Stream: Strong and Weak Urgency: No Frequency: every 30 mins if drinking water, limits due to this and can sometimes make it an hour - 1.5, 3x night Leakage: Urge to void sometimes  Pads: No  INTERCOURSE:  Ability to have vaginal penetration Yes  Pain with intercourse: none DrynessYes  Climax: unable and reports decreased sensation  Marinoff Scale: 0/3  PREGNANCY: Vaginal deliveries 3  C-section deliveries 0 Currently pregnant No  PROLAPSE: None   OBJECTIVE:  Note: Objective measures were completed at Evaluation unless otherwise noted.  DIAGNOSTIC FINDINGS:    PATIENT SURVEYS:   PFIQ-7: 95 08/06/24:  PFIQ-7: 52 08/28/24: PFIQ-7: 48 10/16/24: PFIQ-7: 33  COGNITION: Overall cognitive status: Within functional limits for tasks assessed     SENSATION: Light touch: Appears intact  LUMBAR SPECIAL TESTS:  SI Compression/distraction test: both increased pain at abdomen however back pain improved with compression  FUNCTIONAL TESTS:  Functional squats - unable to complete in proper mechanics, bil knee valgus, trunk flexion and decreased descent by 75%  Sit up test - 0/3 GAIT: Decreased cadence, trunk flexion, Rt rib rounding, decreased step height bil, decreased stride length  POSTURE: rounded shoulders, forward head, increased thoracic kyphosis, posterior pelvic tilt, and flexed trunk    LUMBARAROM/PROM:  A/PROM A/PROM  eval  Flexion WFL but with pain  Extension Limited by 25%  Right lateral flexion Limited by 50%  Left lateral flexion Limited by 50%  Right rotation Limited by 50%  Left rotation Limited by 50%   (Blank rows = not tested)  LOWER EXTREMITY ROM: Bil hamstrings and adductors limited by 50%  LOWER EXTREMITY MMT:  Bil hips grossly 3+/5 PALPATION:   General: tightness at bil thoracic and lumbar paraspinals, TTP at Lt SIJ, Lt gluteal and bil piriformis   Pelvic Alignment: Rt hip hike  Abdominal: TTP at mid quadrant, and lt mid quadrant; scar present throughout midline of abdomen and poor mobility throughout                 External Perineal Exam: dryness and redness present; most redness present at introitus                             Internal Pelvic Floor: TTP throughout bil superficial and deep layers  Patient confirms identification and approves PT to assess internal pelvic floor and treatment Yes No emotional/communication barriers or cognitive limitation. Patient is motivated to learn. Patient understands and agrees with treatment  goals and plan. PT explains patient will be examined in standing, sitting, and lying down to see how their muscles and joints  work. When they are ready, they will be asked to remove their underwear so PT can examine their perineum. The patient is also given the option of providing their own chaperone as one is not provided in our facility. The patient also has the right and is explained the right to defer or refuse any part of the evaluation or treatment including the internal exam. With the patient's consent, PT will use one gloved finger to gently assess the muscles of the pelvic floor, seeing how well it contracts and relaxes and if there is muscle symmetry. After, the patient will get dressed and PT and patient will discuss exam findings and plan of care. PT and patient discuss plan of care, schedule, attendance policy and HEP activities.  PELVIC MMT:   MMT eval 08/11/25:  Vaginal 1/5 - unable to complete consistently, often bearing down 1/5, with much better techniques   Internal Anal Sphincter    External Anal Sphincter    Puborectalis    Diastasis Recti    (Blank rows = not tested)        TONE: Decreased   PROLAPSE: Possible anterior vaginal wall laxity present with coughing in hooklying   TODAY'S TREATMENT:                                                                                                                              DATE:   11/06/24: Open books x10 each  Unilateral happy baby 3x30s  Manual pt In supine - scar tissue mobilization from midline to Lt abdomen and mid low/bladder region for improved mobility with midline scar site most restricted. No leakage during session today. Bimanual with lt lateral abdomen and lt lateral low back with relaxation mild compression and side to side mobility. Fascial release completed in mid low and middle to Lt  Sit to stands 9# 3x10  3# hand weight x2 for x10 lateral lift and return to opposite side (rt/lt) with  forward bend at trunk and transverse abdominis activation  3# hand weight x2 for x10 forward/backwards alt with forward bend at trunk and transverse  abdominis activation  Scap punches x10 - max cues Seated green band bil shoulder ER for improved posture  11/11/24 Standing bil tricep extension blue band 2x10 Standing bent rows 3# x10 each Seated green band bil shoulder horizontal abduction 2x10 3# hand weight x2 for x10 lateral lift and return to opposite side (rt/lt) with  forward bend at trunk and transverse abdominis activation  Green band standing shoulder walks 3x5 rt and lt Seated cat/cow x10 Seated green band D1 flexion x10 each abdomen and mid low/bladder region for improved mobility with midline scar site most restricted. No leakage during session today. Bimanual with lt lateral abdomen and lt lateral low back with relaxation mild compression and side to side mobility. Fascial  release completed in mid low and middle to Lt   11/18/24: Seated blue band diagonals x10 each way Seated blue band bil shoulder horizontal abduction x10 Forward bent rows 2x10 4# Standing bil tricep ext blue band 2x10 4# hand weight x2 for x10 lateral lift and return to opposite side (rt/lt) with forward bend at trunk and transverse abdominis activation  abdomen and mid low/bladder region for improved mobility with midline scar site most restricted. No ostomy leakage during session today. Bimanual with lt lateral abdomen and lt lateral low back with relaxation mild compression and side to side mobility. Fascial release completed in mid low and middle to Lt   PATIENT EDUCATION:  Education details: INTERIOR AND SPATIAL DESIGNER Person educated: Patient Education method: Programmer, Multimedia, Demonstration, Actor cues, Verbal cues, and Handouts Education comprehension: verbalized understanding, returned demonstration, verbal cues required, tactile cues required, and needs further education  HOME EXERCISE PROGRAM: Access Code: [email or web address] URL: [email or web address] Date: 08/27/2024 Prepared by: Darryle  Exercises - Supine Diaphragmatic Breathing  - 1 x daily - 7 x  weekly - 1 sets - 10 reps - Supine Lower Trunk Rotation  - 1 x daily - 7 x weekly - 1 sets - 10 reps - Supported Teacher, Music with Pelvic Floor Relaxation  - 1 x daily - 7 x weekly - 1 sets - 3 reps - 30s holds - Seated Overhead Reach Stretch  - 1 x daily - 7 x weekly - 1 sets - 10 reps - Hooklying Clamshell with Resistance  - 1 x daily - 7 x weekly - 2 sets - 10 reps - Sit to Stand with Pelvic Floor Contraction  - 1 x daily - 7 x weekly - 2 sets - 10 reps - pelvic floor contract and holds  - 1 x daily - 7 x weekly - 2 sets - 10 reps - 5s holds - Seated Quick Flick Pelvic Floor Contractions  - 1 x daily - 7 x weekly - 2 sets - 10 reps - Seated Transversus Abdominis Bracing  - 1 x daily - 7 x weekly - 2 sets - 10 reps - Seated Shoulder Diagonal Pulls with Resistance  - 1 x daily - 7 x weekly - 2 sets - 10 reps - seated pelvic circles  - 1 x daily - 7 x weekly - 2 sets - 10 reps - Tail Wag  - 1 x daily - 7 x weekly - 1 sets - 10 reps - Hip Hinge Rock Back  - 1 x daily - 7 x weekly - 1 sets - 10 reps - Supine Bridge  - 1 x daily - 7 x weekly - 1 sets - 10 reps - overhead press  - 1 x daily - 7 x weekly - 1 sets - 10 reps - Seated Hip Flexion and External Rotation with Ankle Weight  - 1 x daily - 7 x weekly - 1 sets - 10 reps - Seated Hip Flexion March with Ankle Weights  - 1 x daily - 7 x weekly - 1 sets - 10 reps - Seated Cat Cow  - 1 x daily - 7 x weekly - 1 sets - 10 reps - Standing Row with Anchored Resistance  - 1 x daily - 7 x weekly - 1 sets - 10 reps  ASSESSMENT:  CLINICAL IMPRESSION: Patient is a 73 y.o. female  who was seen today for physical therapy treatment for pelvic, abdominal, back pain, urinary incontinence, and increased urinary frequency. Pt tolerated session well, does report  improvement with back pain to now lasting much shorter periods of time and greatly less this past week with bending forward. Continues to progress well and started more trunk extension strengthening  as she is now having more mobility in abdomen, no pain with forward bending with this. Also having improvement with tissue mobility at abdomen with less pain. Does still have point tenderness along lt side lateral to midline scar. Pt very motivated to participate and would would benefit from additional PT to further address deficits.    OBJECTIVE IMPAIRMENTS: decreased activity tolerance, decreased coordination, decreased endurance, decreased mobility, difficulty walking, decreased strength, increased fascial restrictions, increased muscle spasms, impaired flexibility, improper body mechanics, postural dysfunction, and pain.   ACTIVITY LIMITATIONS: carrying, lifting, sitting, standing, transfers, continence, and locomotion level  PARTICIPATION LIMITATIONS: meal prep, cleaning, laundry, interpersonal relationship, community activity, and occupation  PERSONAL FACTORS: Fitness, Time since onset of injury/illness/exacerbation, and 1 comorbidity: medical history are also affecting patient's functional outcome.   REHAB POTENTIAL: Good  CLINICAL DECISION MAKING: Stable/uncomplicated  EVALUATION COMPLEXITY: Low   GOALS: Goals reviewed with patient? Yes  SHORT TERM GOALS: Target date: 07/14/24  Pt to be I with HEP for carry over and continuing recommendations for improved outcomes.   Baseline: Goal status: MET  2.  Pt to be I with pressure management techniques to decreased stress at pelvic floor for improved tolerance to standing and walking without pelvic symptoms.  Baseline:  Goal status: MET  3.  Pt to be I with scar massage for improved abdominal tissue mobility to decreased urinary frequency to better tolerate traveling.  Baseline:  Goal status: MET - I with knowing how to do it but does forget to do it  4.  Pt will be independent with the knack, urge suppression technique, and double voiding in order to improve bladder habits and decrease urinary incontinence for improved skin  integrity.  Baseline:  Goal status: MET   LONG TERM GOALS: Target date: 09/16/24  Pt to be I with advanced HEP for carry over and continuing recommendations for improved outcomes.   Baseline:  Goal status: MET  2.  Pt to demonstrate improved coordination of pelvic floor and breathing mechanics with 10# squat with appropriate synergistic patterns to decrease pain and leakage at least 75% of the time for improved ability to complete a 30 minute walking without strain at pelvic floor and symptoms.    Baseline:  Goal status: MET  3. Pt to demonstrate at least 4+/5 bil hip strength for improved pelvic stability and functional squats without leakage.  Baseline:  Goal status: on going for bil hip flexion (4/5) all others 4+/5  4.  Pt to demonstrate improved core strength with ability to complete sit up test 2/3 without pain or pelvic symptoms.  Baseline:  Goal status: on going 11/20  5.  Pt to report no more than one instance of urinary incontinence in a day for improved skin integrity and confidence to leave for errands.  Baseline:  Goal status: MET   6.  Pt to report no more than 3/10 pain at hip/pelvic complex due to improved mobility and strength of surrounding structures to tolerate at least 1 hour of standing and walking without worsening pain.  Baseline:  Goal status: on going - less often now but has 8/10 pain on average when having pain per pt but only lasts 1-2 hours now.  11/20  PLAN:  PT FREQUENCY: 2x/week  PT DURATION: 26 sessions  PLANNED INTERVENTIONS: 97110-Therapeutic exercises, 97530-  Therapeutic activity, [id number]- Neuromuscular re-education, [phone]- Self Care, 02859- Manual therapy, [phone]- Canalith repositioning, [id number]- Aquatic Therapy, [phone]- Electrical stimulation (manual), [id number]- Vasopneumatic device, [id number]- Ultrasound, [id number]- Traction (mechanical), [id number]- Ionotophoresis 4mg /ml Dexamethasone, 20560 (1-2 muscles), 20561 (3+ muscles)- Dry Needling, Patient/Family  education, Balance training, Taping, Joint mobilization, Spinal mobilization, Scar mobilization, DME instructions, Cryotherapy, Moist heat, and Biofeedback  PLAN FOR NEXT SESSION: core and hip strength, stretching hips and spine, coordination breathing and transverse abdominis activation activation    Darryle Navy, PT, DPT 12/23/20257:41 AM  [facility] [address], [address] [place], [place] [address] Phone # [phone] Fax [phone]   "

## 2024-12-02 ENCOUNTER — Ambulatory Visit: Payer: Self-pay

## 2024-12-02 ENCOUNTER — Ambulatory Visit: Attending: Family Medicine

## 2024-12-02 DIAGNOSIS — M542 Cervicalgia: Secondary | ICD-10-CM | POA: Diagnosis present

## 2024-12-02 DIAGNOSIS — M62838 Other muscle spasm: Secondary | ICD-10-CM | POA: Insufficient documentation

## 2024-12-02 DIAGNOSIS — R293 Abnormal posture: Secondary | ICD-10-CM | POA: Diagnosis present

## 2024-12-02 DIAGNOSIS — R279 Unspecified lack of coordination: Secondary | ICD-10-CM | POA: Insufficient documentation

## 2024-12-02 DIAGNOSIS — M6281 Muscle weakness (generalized): Secondary | ICD-10-CM | POA: Diagnosis present

## 2024-12-02 NOTE — Telephone Encounter (Signed)
"  Appt scheduled  "

## 2024-12-02 NOTE — Therapy (Signed)
 " OUTPATIENT PHYSICAL THERAPY FEMALE PELVIC TREATMENT   Patient Name: Cindy Dudley MRN: [id number] DOB:1951/08/30, 74 y.o., female Today's Date: 12/02/2024   PT End of Session - 12/02/24 1448     Visit Number 25    Date for Recertification  01/25/25    Authorization Type [employer] medicare    Authorization Time Period 10/28/2024 - 12/09/2024    Authorization - Visit Number 5    Authorization - Number of Visits 6    Progress Note Due on Visit 28    PT Start Time 1445    PT Stop Time 1525    PT Time Calculation (min) 40 min    Activity Tolerance Patient tolerated treatment well    Behavior During Therapy WFL for tasks assessed/performed              Past Medical History:  Diagnosis Date   Anemia    Asthma    Blood transfusion    Brain tumor (HCC) 1995   Bruises easily    Bursitis    left knee   Colitis, ulcerative (HCC) 1969   Fatigue    Fibroid tumor 1982   Hearing loss    Kidney stone    Ovarian cyst 1981   Rash    Sore throat    Spasmodic dysphonia 1982   Spasmodic torticollis 2007   Ulcer    Wears glasses    Past Surgical History:  Procedure Laterality Date   ABDOMINAL HYSTERECTOMY  1982   BRAIN TUMOR EXCISION  1995   CHOLECYSTECTOMY  2016   ILEOSTOMY  1982   LARYNX SURGERY  2000   OVARIAN CYST REMOVAL     Patient Active Problem List   Diagnosis Date Noted   Mid back pain 05/16/2024   Pelvic pain 05/16/2024   Ileostomy stenosis (HCC) 09/28/2023   Irritant contact dermatitis associated with fecal stoma 08/26/2023   Ileostomy care (HCC) 08/26/2023   Titubation 08/07/2022   Bilateral hearing loss 08/07/2022   Benign meningioma of brain (HCC) 09/18/2018   Hereditary essential tremor 09/18/2018   Thyroid  nodule, uninodular, left 10/17/2011    PCP: Antonio Cyndee Jamee JONELLE, DO   REFERRING PROVIDER: Antonio Cyndee Jamee JONELLE, DO   REFERRING DIAG: R10.2 (ICD-10-CM) - Pelvic pain  THERAPY DIAG:  Muscle weakness (generalized)  Other muscle spasm  Abnormal  posture  Unspecified lack of coordination  Rationale for Evaluation and Treatment: Rehabilitation  ONSET DATE: chronic   SUBJECTIVE:  SUBJECTIVE STATEMENT: Pt states that she is in more pain today due to moving boxes over the weekend. She does not feel like she is going to be ready to discharge next session.    PAIN: 12/02/2024  Are you having pain? Yes NPRS scale:5/10 Pain location: bil lower rib pain  Pain type: tired ache Pain description: achy  Aggravating factors: no cause per pt Nothing makes it better   PRECAUTIONS: None  RED FLAGS: None   WEIGHT BEARING RESTRICTIONS: No  FALLS:  Has patient fallen in last 6 months? No  OCCUPATION: retired   ACTIVITY LEVEL : low  PLOF: Independent  PATIENT GOALS: to have no pain, wants to stand at least 1 hour without pain  PERTINENT HISTORY:  Coccyx pain,  pelvic floor myalgia, possibly related to nerve damage (per MD note) or surgery, multiple hernia, ostomy, ABDOMINAL HYSTERECTOMY,  Sexual abuse: No  BOWEL MOVEMENT: Uses ostomy since 33s and I  not concerns at this time  URINATION: Pain with urination: No Fully empty bladder: Yes:   Stream: Strong and Weak Urgency: No Frequency: every 30 mins if drinking water, limits due to this and can sometimes make it an hour - 1.5, 3x night Leakage: Urge to void sometimes  Pads: No  INTERCOURSE:  Ability to have vaginal penetration Yes  Pain with intercourse: none DrynessYes  Climax: unable and reports decreased sensation  Marinoff Scale: 0/3  PREGNANCY: Vaginal deliveries 3  C-section deliveries 0 Currently pregnant No  PROLAPSE: None   OBJECTIVE:  Note: Objective measures were completed at Evaluation unless otherwise noted.  DIAGNOSTIC FINDINGS:    PATIENT SURVEYS:    PFIQ-7: 95 08/06/24: PFIQ-7: 52 08/28/24: PFIQ-7: 48 10/16/24: PFIQ-7: 33  COGNITION: Overall cognitive status: Within functional limits for tasks assessed     SENSATION: Light touch: Appears intact  LUMBAR SPECIAL TESTS:  SI Compression/distraction test: both increased pain at abdomen however back pain improved with compression  FUNCTIONAL TESTS:  Functional squats - unable to complete in proper mechanics, bil knee valgus, trunk flexion and decreased descent by 75%  Sit up test - 0/3 GAIT: Decreased cadence, trunk flexion, Rt rib rounding, decreased step height bil, decreased stride length  POSTURE: rounded shoulders, forward head, increased thoracic kyphosis, posterior pelvic tilt, and flexed trunk    LUMBARAROM/PROM:  A/PROM A/PROM  eval  Flexion WFL but with pain  Extension Limited by 25%  Right lateral flexion Limited by 50%  Left lateral flexion Limited by 50%  Right rotation Limited by 50%  Left rotation Limited by 50%   (Blank rows = not tested)  LOWER EXTREMITY ROM: Bil hamstrings and adductors limited by 50%  LOWER EXTREMITY MMT:  Bil hips grossly 3+/5 PALPATION:   General: tightness at bil thoracic and lumbar paraspinals, TTP at Lt SIJ, Lt gluteal and bil piriformis   Pelvic Alignment: Rt hip hike  Abdominal: TTP at mid quadrant, and lt mid quadrant; scar present throughout midline of abdomen and poor mobility throughout                 External Perineal Exam: dryness and redness present; most redness present at introitus                             Internal Pelvic Floor: TTP throughout bil superficial and deep layers  Patient confirms identification and approves PT to assess internal pelvic floor and treatment Yes No emotional/communication barriers or cognitive limitation. Patient is  motivated to learn. Patient understands and agrees with treatment goals and plan. PT explains patient will be examined in standing, sitting, and lying down to see how  their muscles and joints work. When they are ready, they will be asked to remove their underwear so PT can examine their perineum. The patient is also given the option of providing their own chaperone as one is not provided in our facility. The patient also has the right and is explained the right to defer or refuse any part of the evaluation or treatment including the internal exam. With the patient's consent, PT will use one gloved finger to gently assess the muscles of the pelvic floor, seeing how well it contracts and relaxes and if there is muscle symmetry. After, the patient will get dressed and PT and patient will discuss exam findings and plan of care. PT and patient discuss plan of care, schedule, attendance policy and HEP activities.  PELVIC MMT:   MMT eval 08/11/25:  Vaginal 1/5 - unable to complete consistently, often bearing down 1/5, with much better techniques   Internal Anal Sphincter    External Anal Sphincter    Puborectalis    Diastasis Recti    (Blank rows = not tested)        TONE: Decreased   PROLAPSE: Possible anterior vaginal wall laxity present with coughing in hooklying   TODAY'S TREATMENT:                                                                                                                              DATE:   11/11/24 Standing bil tricep extension blue band 2x10 Standing bent rows 3# x10 each Seated green band bil shoulder horizontal abduction 2x10 3# hand weight x2 for x10 lateral lift and return to opposite side (rt/lt) with  forward bend at trunk and transverse abdominis activation  Green band standing shoulder walks 3x5 rt and lt Seated cat/cow x10 Seated green band D1 flexion x10 each abdomen and mid low/bladder region for improved mobility with midline scar site most restricted. No leakage during session today. Bimanual with lt lateral abdomen and lt lateral low back with relaxation mild compression and side to side mobility. Fascial release  completed in mid low and middle to Lt   11/18/24: Seated blue band diagonals x10 each way Seated blue band bil shoulder horizontal abduction x10 Forward bent rows 2x10 4# Standing bil tricep ext blue band 2x10 4# hand weight x2 for x10 lateral lift and return to opposite side (rt/lt) with forward bend at trunk and transverse abdominis activation  abdomen and mid low/bladder region for improved mobility with midline scar site most restricted. No ostomy leakage during session today. Bimanual with lt lateral abdomen and lt lateral low back with relaxation mild compression and side to side mobility. Fascial release completed in mid low and middle to Lt   12/02/2024 Manual: Supine lying with bil LE supported - abdominal scar tissue mobilization focused  to Lt side to avoid leaking around ostomy site Lt sidelying soft tissue mobilization and myofascial release to bil lumbar paraspinals  Exercises: Seated blue band diagonals x10 each way Seated blue band bil shoulder horizontal abduction x10 Standing bil tricep ext blue band 2x10 Ball up wall 10x (yellow ball) Therapeutic activities: Standing bil shoulder extension + green band 2 x 10 Standing bil rows + green band 2 x 10 Forward bent rows 2x10 bil 4# - unilateral hold on parallel bar to help provide support so she can find better core activation   PATIENT EDUCATION:  Education details: INTERIOR AND SPATIAL DESIGNER Person educated: Patient Education method: Programmer, Multimedia, Facilities Manager, Actor cues, Verbal cues, and Handouts Education comprehension: verbalized understanding, returned demonstration, verbal cues required, tactile cues required, and needs further education  HOME EXERCISE PROGRAM: Access Code: [email or web address] URL: [email or web address] Date: 08/27/2024 Prepared by: Darryle  Exercises - Supine Diaphragmatic Breathing  - 1 x daily - 7 x weekly - 1 sets - 10 reps - Supine Lower Trunk Rotation  - 1 x daily - 7 x weekly - 1 sets - 10 reps -  Supported Teacher, Music with Pelvic Floor Relaxation  - 1 x daily - 7 x weekly - 1 sets - 3 reps - 30s holds - Seated Overhead Reach Stretch  - 1 x daily - 7 x weekly - 1 sets - 10 reps - Hooklying Clamshell with Resistance  - 1 x daily - 7 x weekly - 2 sets - 10 reps - Sit to Stand with Pelvic Floor Contraction  - 1 x daily - 7 x weekly - 2 sets - 10 reps - pelvic floor contract and holds  - 1 x daily - 7 x weekly - 2 sets - 10 reps - 5s holds - Seated Quick Flick Pelvic Floor Contractions  - 1 x daily - 7 x weekly - 2 sets - 10 reps - Seated Transversus Abdominis Bracing  - 1 x daily - 7 x weekly - 2 sets - 10 reps - Seated Shoulder Diagonal Pulls with Resistance  - 1 x daily - 7 x weekly - 2 sets - 10 reps - seated pelvic circles  - 1 x daily - 7 x weekly - 2 sets - 10 reps - Tail Wag  - 1 x daily - 7 x weekly - 1 sets - 10 reps - Hip Hinge Rock Back  - 1 x daily - 7 x weekly - 1 sets - 10 reps - Supine Bridge  - 1 x daily - 7 x weekly - 1 sets - 10 reps - overhead press  - 1 x daily - 7 x weekly - 1 sets - 10 reps - Seated Hip Flexion and External Rotation with Ankle Weight  - 1 x daily - 7 x weekly - 1 sets - 10 reps - Seated Hip Flexion March with Ankle Weights  - 1 x daily - 7 x weekly - 1 sets - 10 reps - Seated Cat Cow  - 1 x daily - 7 x weekly - 1 sets - 10 reps - Standing Row with Anchored Resistance  - 1 x daily - 7 x weekly - 1 sets - 10 reps  ASSESSMENT:  CLINICAL IMPRESSION: Patient is a 74 y.o. female  who was seen today for physical therapy treatment for pelvic, abdominal, back pain, urinary incontinence, and increased urinary frequency. Pt having more pain today after moving boxes over the weekend. We continued manual techniques to abdominal scar tissue and low back restriction  with good tolerance and report of 0/10 pain afterwards. She was able to continue strengthening for posterior chain with good tolerance, but did have some increase in Rt shoulder and neck pain. We  performed bent rows in parallel bars for unilateral UE support in order to help her find better activation in her core. She did well with this and education provided on how these bent exercises will help her pain. Pt very motivated to participate and would would benefit from additional PT to further address deficits.    OBJECTIVE IMPAIRMENTS: decreased activity tolerance, decreased coordination, decreased endurance, decreased mobility, difficulty walking, decreased strength, increased fascial restrictions, increased muscle spasms, impaired flexibility, improper body mechanics, postural dysfunction, and pain.   ACTIVITY LIMITATIONS: carrying, lifting, sitting, standing, transfers, continence, and locomotion level  PARTICIPATION LIMITATIONS: meal prep, cleaning, laundry, interpersonal relationship, community activity, and occupation  PERSONAL FACTORS: Fitness, Time since onset of injury/illness/exacerbation, and 1 comorbidity: medical history are also affecting patient's functional outcome.   REHAB POTENTIAL: Good  CLINICAL DECISION MAKING: Stable/uncomplicated  EVALUATION COMPLEXITY: Low   GOALS: Goals reviewed with patient? Yes  SHORT TERM GOALS: Target date: 07/14/24  Pt to be I with HEP for carry over and continuing recommendations for improved outcomes.   Baseline: Goal status: MET  2.  Pt to be I with pressure management techniques to decreased stress at pelvic floor for improved tolerance to standing and walking without pelvic symptoms.  Baseline:  Goal status: MET  3.  Pt to be I with scar massage for improved abdominal tissue mobility to decreased urinary frequency to better tolerate traveling.  Baseline:  Goal status: MET - I with knowing how to do it but does forget to do it  4.  Pt will be independent with the knack, urge suppression technique, and double voiding in order to improve bladder habits and decrease urinary incontinence for improved skin integrity.  Baseline:   Goal status: MET   LONG TERM GOALS: Target date: 09/16/24  Pt to be I with advanced HEP for carry over and continuing recommendations for improved outcomes.   Baseline:  Goal status: MET  2.  Pt to demonstrate improved coordination of pelvic floor and breathing mechanics with 10# squat with appropriate synergistic patterns to decrease pain and leakage at least 75% of the time for improved ability to complete a 30 minute walking without strain at pelvic floor and symptoms.    Baseline:  Goal status: MET  3. Pt to demonstrate at least 4+/5 bil hip strength for improved pelvic stability and functional squats without leakage.  Baseline:  Goal status: on going for bil hip flexion (4/5) all others 4+/5 12/02/2024  4.  Pt to demonstrate improved core strength with ability to complete sit up test 2/3 without pain or pelvic symptoms.  Baseline:  Goal status: on going 12/02/2024  5.  Pt to report no more than one instance of urinary incontinence in a day for improved skin integrity and confidence to leave for errands.  Baseline:  Goal status: MET   6.  Pt to report no more than 3/10 pain at hip/pelvic complex due to improved mobility and strength of surrounding structures to tolerate at least 1 hour of standing and walking without worsening pain.  Baseline:  Goal status: on going - less often now but has 8/10 pain on average when having pain per pt but only lasts 1-2 hours now.  12/02/2024  PLAN:  PT FREQUENCY: 2x/week  PT DURATION: 26 sessions  PLANNED INTERVENTIONS: 97110-Therapeutic  exercises, 97530- Therapeutic activity, [id number]- Neuromuscular re-education, [phone]- Self Care, 02859- Manual therapy, [phone]- Canalith repositioning, [id number]- Aquatic Therapy, [phone]- Electrical stimulation (manual), [id number]- Vasopneumatic device, [id number]- Ultrasound, [id number]- Traction (mechanical), [id number]- Ionotophoresis 4mg /ml Dexamethasone, 20560 (1-2 muscles), 20561 (3+ muscles)- Dry Needling, Patient/Family education,  Balance training, Taping, Joint mobilization, Spinal mobilization, Scar mobilization, DME instructions, Cryotherapy, Moist heat, and Biofeedback  PLAN FOR NEXT SESSION: core and hip strength, stretching hips and spine, coordination breathing and transverse abdominis activation activation    Josette Mares, PT, DPT1/6/20263:25 PM [facility] [address], [address] [place], [place] [address] Phone # [phone] Fax [phone]   "

## 2024-12-02 NOTE — Telephone Encounter (Signed)
" °  FYI Only or Action Required?: FYI only for provider: appointment scheduled on 1/8.  Patient was last seen in primary care on 05/16/2024 by Antonio Meth, Jamee SAUNDERS, DO.  Called Nurse Triage reporting Arm Pain.  Symptoms began several months ago.  Interventions attempted: OTC medications: tylenol.  Symptoms are: stable.  Triage Disposition: See PCP When Office is Open (Within 3 Days)  Patient/caregiver understands and will follow disposition?: Yes  Copied from CRM #[id number]. Topic: Clinical - Red Word Triage >> Dec 02, 2024  3:43 PM Robinson H wrote: Red Word that prompted transfer to Nurse Triage: Feels like she's getting arthritis in hands and bursitis in left arm and arthritis in left knee, pain in knees, hands and arm. Pain right in hand is about a 7 in 1 finger is worse, other hand starting to go into the fingers, if she lifts arm pain is a 7 Reason for Disposition  [1] MODERATE pain (e.g., interferes with normal activities) AND [2] present > 3 days  Answer Assessment - Initial Assessment Questions 1. ONSET: When did the pain start?     Few months ago 2. LOCATION: Where is the pain located?     Lt arm 3. PAIN: How bad is the pain? (Scale 0-10; or none, mild, moderate, severe)     7/10 4. WORK OR EXERCISE: Has there been any recent work or exercise that involved this part of the body?     no 5. CAUSE: What do you think is causing the arm pain?     arthritis 6. OTHER SYMPTOMS: Do you have any other symptoms? (e.g., neck pain, swelling, rash, fever, numbness, weakness)     Denies swelling, redness, numbness  Protocols used: Arm Pain-A-AH  "

## 2024-12-03 ENCOUNTER — Ambulatory Visit: Payer: Self-pay | Admitting: Physical Therapy

## 2024-12-03 NOTE — Progress Notes (Addendum)
 "  Acute Office Visit  Subjective:     Patient ID: Cindy Dudley, female    DOB: 1951-10-08, 74 y.o.   MRN: [id number]  Chief Complaint  Patient presents with   Arm Pain    Left upper arm - patient requested that she would like a referral for PT at [facility] [phone]   joint pain   Hand Pain    Both hands - right hand is worse - no injury   Neck Pain   Knee Pain    Left knee     HPI Patient is in today for acute visit.   History of Present Illness Cindy Dudley is a 74 year old female presents for acute visit.  Patient reports several months of worsening joint pain involving the hands, left arm, and left knee. Describes pain in the right hand and left arm as up to 7/10, particularly with lifting the arm, and reports progression of finger involvement in both hands. Denies swelling, redness, numbness, weakness, fever, or recent injury, falls or overuse. Pain has been persistent, 5/10 and interfering with daily activities.  She has had left shoulder pain for about one month, worse with movement and without recent trauma or falls. Over-the-counter arthritis Tylenol using as needed, and topical creams give only brief relief, and she rates the pain as significant with certain motions.Chronic neck pain is long-standing and constant, rated 8/10 with neck rotation. She is currently in physical therapy for her back.  She has b/l joint pain in the hands and c/o chronic left knee pain. Left knee pain is long-standing and associated with clicking on movement. Finger pain has been present for more than six weeks, worse in the dominant right hand and sometimes extending into the wrists. Denies injury or trauma. Denies numbness, tingling.  She has Hx of elevated CRP, elevated sed rate.  ROS See HPI     Objective:    Pulse 75   Temp 98.2 F (36.8 C) (Oral)   Ht 4' 10 (1.473 m)   Wt 154 lb 6.4 oz (70 kg)   SpO2 94%   BMI 32.27 kg/m    Physical  Exam Vitals reviewed.  Constitutional:      General: She is not in acute distress.    Appearance: She is not toxic-appearing.  HENT:     Head: Normocephalic and atraumatic.     Mouth/Throat:     Mouth: Mucous membranes are moist.     Pharynx: Oropharynx is clear.  Eyes:     Extraocular Movements: Extraocular movements intact.     Pupils: Pupils are equal, round, and reactive to light.  Cardiovascular:     Rate and Rhythm: Normal rate and regular rhythm.     Pulses: Normal pulses.     Heart sounds: Normal heart sounds. No murmur heard. Pulmonary:     Effort: Pulmonary effort is normal. No respiratory distress.     Breath sounds: Normal breath sounds. No wheezing.  Musculoskeletal:        General: No swelling.     Cervical back: Neck supple.     Comments: R shoulder TWP, FULL ROM B/l knee- FULL ROM  Skin:    General: Skin is warm and dry.  Neurological:     General: No focal deficit present.     Mental Status: She is alert and oriented to person, place, and time.  Psychiatric:        Mood and Affect: Mood normal.  Behavior: Behavior normal.        Thought Content: Thought content normal.        Judgment: Judgment normal.     No results found for any visits on 12/04/24.      Assessment & Plan:   Problem List Items Addressed This Visit       Other   Acute pain of left shoulder   Relevant Orders   Ambulatory referral to Physical Therapy   Arthralgia - Primary   Relevant Orders   C-reactive protein   Sedimentation rate   Antinuclear Antib (ANA)   Rheumatoid Factor   Basic Metabolic Panel (BMET)   CBC   Ambulatory referral to Physical Therapy   Bilateral hand pain   Relevant Orders   DG HANDS 1 VIEW BILAT BALLCATCHERS   Chronic pain of left knee   Relevant Medications   acetaminophen (TYLENOL) 500 MG tablet   Neck pain   Relevant Orders   Ambulatory referral to Physical Therapy    Chronic multifocal joint pain (neck, left shoulder, bilateral  hands, left knee) Differentials- OA, RA. considered.  Discussed Tylenol for pain management and physical therapy for functional improvement. -Check CRP,ANA,RF,CMP, Sed rate - Xray bilateral hands pending - Referred to physical therapy for neck, left shoulder, and left knee pain management. - Start Tylenol 1000 mg daily -RTC for persistent or worsening symptoms    No orders of the defined types were placed in this encounter.   No follow-ups on file.  Harlene LITTIE Jolly, NP   "

## 2024-12-04 ENCOUNTER — Ambulatory Visit: Admitting: Student

## 2024-12-04 ENCOUNTER — Encounter: Payer: Self-pay | Admitting: Student

## 2024-12-04 ENCOUNTER — Ambulatory Visit (HOSPITAL_BASED_OUTPATIENT_CLINIC_OR_DEPARTMENT_OTHER)
Admission: RE | Admit: 2024-12-04 | Discharge: 2024-12-04 | Disposition: A | Discharge status: Home / Self Care | Source: Ambulatory Visit | Attending: Student | Admitting: Student

## 2024-12-04 VITALS — BP 128/79 | HR 75 | Temp 98.2°F | Ht <= 58 in | Wt 154.4 lb

## 2024-12-04 DIAGNOSIS — M25512 Pain in left shoulder: Secondary | ICD-10-CM | POA: Insufficient documentation

## 2024-12-04 DIAGNOSIS — G8929 Other chronic pain: Secondary | ICD-10-CM | POA: Insufficient documentation

## 2024-12-04 DIAGNOSIS — M79642 Pain in left hand: Secondary | ICD-10-CM | POA: Diagnosis present

## 2024-12-04 DIAGNOSIS — M542 Cervicalgia: Secondary | ICD-10-CM | POA: Diagnosis not present

## 2024-12-04 DIAGNOSIS — M25562 Pain in left knee: Secondary | ICD-10-CM

## 2024-12-04 DIAGNOSIS — M79641 Pain in right hand: Secondary | ICD-10-CM | POA: Insufficient documentation

## 2024-12-04 DIAGNOSIS — M2550 Pain in unspecified joint: Secondary | ICD-10-CM | POA: Insufficient documentation

## 2024-12-05 LAB — BASIC METABOLIC PANEL WITH GFR
BUN: 15 mg/dL (ref 6–23)
CO2: 29 meq/L (ref 19–32)
Calcium: 9.7 mg/dL (ref 8.4–10.5)
Chloride: 101 meq/L (ref 96–112)
Creatinine, Ser: 0.87 mg/dL (ref 0.40–1.20)
GFR: 66.04 mL/min
Glucose, Bld: 88 mg/dL (ref 70–99)
Potassium: 4 meq/L (ref 3.5–5.1)
Sodium: 139 meq/L (ref 135–145)

## 2024-12-05 LAB — CBC
HCT: 41.8 % (ref 36.0–46.0)
Hemoglobin: 14.6 g/dL (ref 12.0–15.0)
MCHC: 34.8 g/dL (ref 30.0–36.0)
MCV: 93.1 fl (ref 78.0–100.0)
Platelets: 304 K/uL (ref 150.0–400.0)
RBC: 4.49 Mil/uL (ref 3.87–5.11)
RDW: 12.9 % (ref 11.5–15.5)
WBC: 6.8 K/uL (ref 4.0–10.5)

## 2024-12-05 LAB — SEDIMENTATION RATE: Sed Rate: 14 mm/h (ref 0–30)

## 2024-12-05 LAB — ANA: Anti Nuclear Antibody (ANA): NEGATIVE

## 2024-12-05 LAB — C-REACTIVE PROTEIN: CRP: <0.5 mg/dL — ABNORMAL LOW (ref 1.0–20.0)

## 2024-12-05 LAB — RHEUMATOID FACTOR: Rheumatoid fact SerPl-aCnc: <10 [IU]/mL

## 2024-12-08 ENCOUNTER — Ambulatory Visit: Payer: Self-pay | Admitting: Student

## 2024-12-08 ENCOUNTER — Ambulatory Visit: Payer: Self-pay | Admitting: Physical Therapy

## 2024-12-08 DIAGNOSIS — R29898 Other symptoms and signs involving the musculoskeletal system: Secondary | ICD-10-CM

## 2024-12-08 DIAGNOSIS — M6281 Muscle weakness (generalized): Secondary | ICD-10-CM

## 2024-12-08 DIAGNOSIS — M62838 Other muscle spasm: Secondary | ICD-10-CM

## 2024-12-08 DIAGNOSIS — M79641 Pain in right hand: Secondary | ICD-10-CM

## 2024-12-08 DIAGNOSIS — R293 Abnormal posture: Secondary | ICD-10-CM

## 2024-12-08 NOTE — Therapy (Unsigned)
 " OUTPATIENT PHYSICAL THERAPY ORTHO EVALUATION   Patient Name: Rexanna Louthan MRN: [id number] DOB:[birth year], 74 y.o., female Today's Date: 12/09/2024  END OF SESSION:  PT End of Session - 12/09/24 1226     Visit Number 27   1 ortho, 26 pelvic   Date for Recertification  01/30/25    Authorization Type [employer] medicare -ortho to request auth on 12/09/24    Progress Note Due on Visit 37    PT Start Time 1230    PT Stop Time 1310    PT Time Calculation (min) 40 min    Activity Tolerance Patient tolerated treatment well    Behavior During Therapy WFL for tasks assessed/performed          Past Medical History:  Diagnosis Date   Anemia    Asthma    Blood transfusion    Brain tumor (HCC) 1995   Bruises easily    Bursitis    left knee   Colitis, ulcerative (HCC) 1969   Fatigue    Fibroid tumor 1982   Hearing loss    Kidney stone    Ovarian cyst 1981   Rash    Sore throat    Spasmodic dysphonia 1982   Spasmodic torticollis 2007   Ulcer    Wears glasses    Past Surgical History:  Procedure Laterality Date   ABDOMINAL HYSTERECTOMY  1982   BRAIN TUMOR EXCISION  1995   CHOLECYSTECTOMY  2016   ILEOSTOMY  1982   LARYNX SURGERY  2000   OVARIAN CYST REMOVAL     Patient Active Problem List   Diagnosis Date Noted   Arthralgia 12/04/2024   Neck pain 12/04/2024   Acute pain of left shoulder 12/04/2024   Bilateral hand pain 12/04/2024   Chronic pain of left knee 12/04/2024   Mid back pain 05/16/2024   Pelvic pain 05/16/2024   Ileostomy stenosis (HCC) 09/28/2023   Irritant contact dermatitis associated with fecal stoma 08/26/2023   Ileostomy care (HCC) 08/26/2023   Titubation 08/07/2022   Bilateral hearing loss 08/07/2022   Benign meningioma of brain (HCC) 09/18/2018   Hereditary essential tremor 09/18/2018   Thyroid  nodule, uninodular, left 10/17/2011    PCP: Antonio Cyndee Jamee JONELLE, DO  REFERRING PROVIDER: Wheeler Harlene CROME, NP  REFERRING DIAG: M25.50 (ICD-10-CM) -  Arthralgia, unspecified joint M54.2 (ICD-10-CM) - Neck pain M25.512 (ICD-10-CM) - Acute pain of left shoulder  THERAPY DIAG:  Cervicalgia - Plan: PT plan of care cert/re-cert  Muscle weakness (generalized) - Plan: PT plan of care cert/re-cert  Other muscle spasm - Plan: PT plan of care cert/re-cert  Abnormal posture - Plan: PT plan of care cert/re-cert  Unspecified lack of coordination - Plan: PT plan of care cert/re-cert  Rationale for Evaluation and Treatment: Rehabilitation  ONSET DATE: for years for neck, 3-4 months for the left knee  SUBJECTIVE:  SUBJECTIVE STATEMENT: Patient reports that she has an essential tremor that she feels makes her neck pain worse.  She states that she started having left shoulder pain about a month ago, but it has started getting some better.  Patient reports that she has also been having some hand pain, more on her right.  States that she is having difficulty with opening jars and with opening/closing her hand.  She reports that her left knee pain started about about 3-4 months ago when she was doing some of her PT exercises for pelvic PT.  Hand dominance: Right  PERTINENT HISTORY:  Essential tremor, brain tumor s/p surgical excision in the 90s, spasmatic dysphonia (had surgical repair) Coccyx pain, pelvic floor myalgia, possibly related to nerve damage (per MD note) or surgery, multiple hernia, ostomy, ABDOMINAL HYSTERECTOMY   PAIN:  Are you having pain? Yes: NPRS scale: 7-8/10 Pain location: main pain is my neck Pain description: sharp Aggravating factors: cervical rotation Relieving factors: rest/not moving  PRECAUTIONS: None  RED FLAGS: None     WEIGHT BEARING RESTRICTIONS: No  FALLS:  Has patient fallen in last 6 months? No  LIVING  ENVIRONMENT: Lives with: lives with their spouse and lives with their daughter Lives in: House/apartment Stairs: one story Has following equipment at home: Single point cane, Environmental Consultant - 2 wheeled, shower chair, and Grab bars  OCCUPATION: Retired  PLOF: Independent and Leisure: reading, shopping  PATIENT GOALS: To have less pain in her hands and have more strength to open things.  NEXT MD VISIT: Dr Antonio on 03/30/25  OBJECTIVE:  Note: Objective measures were completed at Evaluation unless otherwise noted.  DIAGNOSTIC FINDINGS:  Lumbar Radiograph on 08/24/2024: Impression: 1.  No acute fracture or traumatic malalignment.  2.  Multilevel disc degenerative changes most marked at L4-L5 and L5-S1.  3.  Osteopenia.  4.  Mild levocurvature of the thoracolumbar spine.  5.  Multiple surgical clips projecting over abdomen and pelvis.   Sacral Radiograph on 08/24/2024: IMPRESSION:  No acute fracture or malalignment.  Multiple surgical clips projecting over lower abdomen and pelvis.  Mild osteoarthritic changes of the hip joints bilaterally.    PATIENT SURVEYS:  Eval:  Quick DASH:  61.36  COGNITION: Overall cognitive status: Within functional limits for tasks assessed  SENSATION: Patient denies numbness and tinglng  POSTURE: rounded shoulders and increased thoracic kyphosis  PALPATION: Slight tenderness to palpation along cervical and thoracic region   CERVICAL ROM:   Active ROM A/ROM (deg) eval  Flexion 55*  Extension 45  Right lateral flexion 20*  Left lateral flexion 35  Right rotation 52*  Left rotation 40*   (Blank rows = not tested)  UPPER EXTREMITY ROM:  Active ROM Right eval Left eval  Shoulder flexion 145 122  Shoulder extension    Shoulder abduction 160 142  Shoulder adduction    Shoulder extension     (Blank rows = not tested)  UPPER/LOWER EXTREMITY MMT:  MMT Right eval Left eval  Shoulder flexion 4-/5 4  Shoulder extension    Shoulder abduction  4-/5 4  Shoulder adduction    Shoulder extension    Shoulder internal rotation 5 5/5  Shoulder external rotation 4- 4/5          Elbow flexion 5 5  Elbow extension 4-/5* 4/5  Knee Extension 5 5  Knee Flexion 4 4      Grip strength 24 24   (Blank rows = not tested)   FUNCTIONAL TESTS:  5 times  sit to stand: 9.22 sec Timed Up and Go (TUG):  7.79 sec  TREATMENT DATE:  12/09/2024: Reviewed role of PT Reviewed HEP provided   PATIENT EDUCATION:  Education details: Issued HEP Person educated: Patient Education method: Explanation, Demonstration, and Handouts Education comprehension: verbalized understanding  HOME EXERCISE PROGRAM: Access Code: [email or web address] URL: [email or web address] Date: 12/09/2024 Prepared by: Jarrell Jearline Hirschhorn  Exercises - Seated Scapular Retraction  - 1 x daily - 7 x weekly - 2 sets - 10 reps - Seated Cervical Retraction  - 1 x daily - 7 x weekly - 2 sets - 10 reps - Shoulder Flexion Wall Slide with Towel (Mirrored)  - 1 x daily - 7 x weekly - 2 sets - 10 reps - Shoulder Scaption Wall Slide with Towel  - 1 x daily - 7 x weekly - 2 sets - 10 reps - Sidelying Thoracic Rotation with Open Book  - 1 x daily - 7 x weekly - 1-2 sets - 10 reps - Supine Chin Tuck  - 1 x daily - 7 x weekly - 2 sets - 10 reps  ASSESSMENT:  CLINICAL IMPRESSION: Patient is a 74 y.o. female who was seen today for physical therapy evaluation and treatment for arthralgia, neck pain, and shoulder pain. Patient states that she has been having neck pain for years and did not realize that PT could help with it until one of her pelvic PT's recommended that she follow up with her MD and ask about if PT could help her some.  She states that she feels that her neck pain started after she developed an essential tremor for her spasmodic dysphonia.  Patient reports that recently, she has started having some left shoulder pain and has noted increased pain in both of her hands.  Additionally,  patient reports that she noticed that she started having some left knee pain with some of her pelvic PT exercises.  Patient presents with increased pain, abnormal posture, muscle weakness throughout, muscle spasms of her cervical paraspinals and upper traps, and decreased grip strength.  Patient would benefit from skilled PT to address her functional impairments to allow her to return to her prior activity level with less pain and difficulty.  OBJECTIVE IMPAIRMENTS: decreased mobility, decreased ROM, decreased strength, increased muscle spasms, impaired flexibility, impaired tone, impaired UE functional use, improper body mechanics, postural dysfunction, and pain.   ACTIVITY LIMITATIONS: carrying, lifting, bending, squatting, sleeping, reach over head, and hygiene/grooming  PARTICIPATION LIMITATIONS: meal prep, cleaning, laundry, driving, shopping, and community activity  PERSONAL FACTORS: Age, Past/current experiences, Time since onset of injury/illness/exacerbation, and 3+ comorbidities: OA, essential tremor, Hx of brain tumor are also affecting patient's functional outcome.   REHAB POTENTIAL: Good  CLINICAL DECISION MAKING: Evolving/moderate complexity  EVALUATION COMPLEXITY: Moderate   GOALS: Goals reviewed with patient? Yes  SHORT TERM GOALS: Target date: 01/02/2025  Patient will be independent with initial HEP. Baseline:  Goal status: INITIAL  2.  Increase left shoulder flexion by at least 10 degrees to allow patient to more easily reach into overhead cabinets. Baseline:  Goal status: INITIAL    LONG TERM GOALS: Target date: 01/30/2025  Patient will be independent with advanced HEP to allow for self progression after discharge. Baseline:  Goal status: INITIAL  2.  Patient will increase cervical A/ROM to [facility] to allow her to look over her shoulder without increased pain. Baseline:  Goal status: INITIAL  3.  Patient will increase shoulder A/ROM to [facility] to allow her to wash her  back without difficulty. Baseline:  Goal status: INITIAL  4.  Patient will improve Quick DASH to no greater than 40 to demonstrate improvements in functional mobility/tasks. Baseline: 61.36 Goal status: INITIAL  5.  Patient will increase grip strength to [facility] to allow her to open more objects with less pain. Baseline:  Goal status: INITIAL    PLAN:  PT FREQUENCY: 1-2x/week  PT DURATION: 8 weeks  PLANNED INTERVENTIONS: 97164- PT Re-evaluation, 97750- Physical Performance Testing, 97110-Therapeutic exercises, 97530- Therapeutic activity, 97112- Neuromuscular re-education, 97535- Self Care, [id number]- Manual therapy, [phone]- Canalith repositioning, [id number]- Aquatic Therapy, [phone]- Electrical stimulation (unattended), [phone]- Electrical stimulation (manual), [id number]- Vasopneumatic device, [id number]- Ultrasound, [id number]- Traction (mechanical), [id number]- Ionotophoresis 4mg /ml Dexamethasone, 79439 (1-2 muscles), [id number] (3+ muscles)- Dry Needling, Patient/Family education, Balance training, Taping, Joint mobilization, Joint manipulation, Spinal manipulation, Spinal mobilization, Cryotherapy, and Moist heat  PLAN FOR NEXT SESSION: Assess and progress HEP as indicated, strengthening, flexibility, manual/dry needling as indicated    Jarrell Laming, PT, DPT 12/09/2024, 1:39 PM  [facility] [address] [place], [place] [address] Phone # [phone] Fax [phone]  "

## 2024-12-08 NOTE — Therapy (Signed)
 " OUTPATIENT PHYSICAL THERAPY FEMALE PELVIC TREATMENT   Patient Name: Cindy Dudley MRN: [id number] DOB:[birth year], 73 y.o., female Today's Date: 12/08/2024   PT End of Session - 12/08/24 1456     Visit Number 26    Date for Recertification  01/25/25    Authorization Type [employer] medicare    Authorization Time Period 10/28/2024 - 12/09/2024    Authorization - Visit Number 6    Authorization - Number of Visits 6    Progress Note Due on Visit 28    PT Start Time 1446    PT Stop Time 1525    PT Time Calculation (min) 39 min    Activity Tolerance Patient tolerated treatment well    Behavior During Therapy WFL for tasks assessed/performed               Past Medical History:  Diagnosis Date   Anemia    Asthma    Blood transfusion    Brain tumor (HCC) 1995   Bruises easily    Bursitis    left knee   Colitis, ulcerative (HCC) 1969   Fatigue    Fibroid tumor 1982   Hearing loss    Kidney stone    Ovarian cyst 1981   Rash    Sore throat    Spasmodic dysphonia 1982   Spasmodic torticollis 2007   Ulcer    Wears glasses    Past Surgical History:  Procedure Laterality Date   ABDOMINAL HYSTERECTOMY  1982   BRAIN TUMOR EXCISION  1995   CHOLECYSTECTOMY  2016   ILEOSTOMY  1982   LARYNX SURGERY  2000   OVARIAN CYST REMOVAL     Patient Active Problem List   Diagnosis Date Noted   Arthralgia 12/04/2024   Neck pain 12/04/2024   Acute pain of left shoulder 12/04/2024   Bilateral hand pain 12/04/2024   Chronic pain of left knee 12/04/2024   Mid back pain 05/16/2024   Pelvic pain 05/16/2024   Ileostomy stenosis (HCC) 09/28/2023   Irritant contact dermatitis associated with fecal stoma 08/26/2023   Ileostomy care (HCC) 08/26/2023   Titubation 08/07/2022   Bilateral hearing loss 08/07/2022   Benign meningioma of brain (HCC) 09/18/2018   Hereditary essential tremor 09/18/2018   Thyroid  nodule, uninodular, left 10/17/2011    PCP: Antonio Cyndee Jamee JONELLE, DO   REFERRING  PROVIDER: Antonio Cyndee Jamee JONELLE, DO   REFERRING DIAG: R10.2 (ICD-10-CM) - Pelvic pain  THERAPY DIAG:  Muscle weakness (generalized)  Other muscle spasm  Abnormal posture  Rationale for Evaluation and Treatment: Rehabilitation  ONSET DATE: chronic   SUBJECTIVE:  SUBJECTIVE STATEMENT: Pt reports she now has lower pain levels since starting PT, has 7/10 max pain with bending over, lifting. But recovers much more quickly now.    PAIN: 1//2026  Are you having pain? Yes NPRS scale:2/10 Pain location: mid back/ribs  Pain type: tired ache Pain description: achy  Aggravating factors: no cause per pt Nothing makes it better   PRECAUTIONS: None  RED FLAGS: None   WEIGHT BEARING RESTRICTIONS: No  FALLS:  Has patient fallen in last 6 months? No  OCCUPATION: retired   ACTIVITY LEVEL : low  PLOF: Independent  PATIENT GOALS: to have no pain, wants to stand at least 1 hour without pain  PERTINENT HISTORY:  Coccyx pain,  pelvic floor myalgia, possibly related to nerve damage (per MD note) or surgery, multiple hernia, ostomy, ABDOMINAL HYSTERECTOMY,  Sexual abuse: No  BOWEL MOVEMENT: Uses ostomy since 91s and I  not concerns at this time  URINATION: Pain with urination: No Fully empty bladder: Yes:   Stream: Strong and Weak Urgency: No Frequency: every 30 mins if drinking water, limits due to this and can sometimes make it an hour - 1.5, 3x night Leakage: Urge to void sometimes  Pads: No  INTERCOURSE:  Ability to have vaginal penetration Yes  Pain with intercourse: none DrynessYes  Climax: unable and reports decreased sensation  Marinoff Scale: 0/3  PREGNANCY: Vaginal deliveries 3  C-section deliveries 0 Currently pregnant No  PROLAPSE: None   OBJECTIVE:  Note:  Objective measures were completed at Evaluation unless otherwise noted.  DIAGNOSTIC FINDINGS:    PATIENT SURVEYS:   PFIQ-7: 95 08/06/24: PFIQ-7: 52 08/28/24: PFIQ-7: 48 10/16/24: PFIQ-7: 33  COGNITION: Overall cognitive status: Within functional limits for tasks assessed     SENSATION: Light touch: Appears intact  LUMBAR SPECIAL TESTS:  SI Compression/distraction test: both increased pain at abdomen however back pain improved with compression  FUNCTIONAL TESTS:  Functional squats - unable to complete in proper mechanics, bil knee valgus, trunk flexion and decreased descent by 75%  Sit up test - 0/3 GAIT: Decreased cadence, trunk flexion, Rt rib rounding, decreased step height bil, decreased stride length  POSTURE: rounded shoulders, forward head, increased thoracic kyphosis, posterior pelvic tilt, and flexed trunk    LUMBARAROM/PROM:  A/PROM A/PROM  eval  Flexion WFL but with pain  Extension Limited by 25%  Right lateral flexion Limited by 50%  Left lateral flexion Limited by 50%  Right rotation Limited by 50%  Left rotation Limited by 50%   (Blank rows = not tested)  LOWER EXTREMITY ROM: Bil hamstrings and adductors limited by 50%  LOWER EXTREMITY MMT:  Bil hips grossly 3+/5 PALPATION:   General: tightness at bil thoracic and lumbar paraspinals, TTP at Lt SIJ, Lt gluteal and bil piriformis   Pelvic Alignment: Rt hip hike  Abdominal: TTP at mid quadrant, and lt mid quadrant; scar present throughout midline of abdomen and poor mobility throughout                 External Perineal Exam: dryness and redness present; most redness present at introitus                             Internal Pelvic Floor: TTP throughout bil superficial and deep layers  Patient confirms identification and approves PT to assess internal pelvic floor and treatment Yes No emotional/communication barriers or cognitive limitation. Patient is motivated to learn. Patient understands and agrees  with treatment goals and plan. PT explains patient will be examined in standing, sitting, and lying down to see how their muscles and joints work. When they are ready, they will be asked to remove their underwear so PT can examine their perineum. The patient is also given the option of providing their own chaperone as one is not provided in our facility. The patient also has the right and is explained the right to defer or refuse any part of the evaluation or treatment including the internal exam. With the patient's consent, PT will use one gloved finger to gently assess the muscles of the pelvic floor, seeing how well it contracts and relaxes and if there is muscle symmetry. After, the patient will get dressed and PT and patient will discuss exam findings and plan of care. PT and patient discuss plan of care, schedule, attendance policy and HEP activities.  PELVIC MMT:   MMT eval 08/11/25:  Vaginal 1/5 - unable to complete consistently, often bearing down 1/5, with much better techniques   Internal Anal Sphincter    External Anal Sphincter    Puborectalis    Diastasis Recti    (Blank rows = not tested)        TONE: Decreased   PROLAPSE: Possible anterior vaginal wall laxity present with coughing in hooklying   TODAY'S TREATMENT:                                                                                                                              DATE:   11/11/24 Standing bil tricep extension blue band 2x10 Standing bent rows 3# x10 each Seated green band bil shoulder horizontal abduction 2x10 3# hand weight x2 for x10 lateral lift and return to opposite side (rt/lt) with  forward bend at trunk and transverse abdominis activation  Green band standing shoulder walks 3x5 rt and lt Seated cat/cow x10 Seated green band D1 flexion x10 each abdomen and mid low/bladder region for improved mobility with midline scar site most restricted. No leakage during session today. Bimanual with lt  lateral abdomen and lt lateral low back with relaxation mild compression and side to side mobility. Fascial release completed in mid low and middle to Lt   11/18/24: Seated blue band diagonals x10 each way Seated blue band bil shoulder horizontal abduction x10 Forward bent rows 2x10 4# Standing bil tricep ext blue band 2x10 4# hand weight x2 for x10 lateral lift and return to opposite side (rt/lt) with forward bend at trunk and transverse abdominis activation  abdomen and mid low/bladder region for improved mobility with midline scar site most restricted. No ostomy leakage during session today. Bimanual with lt lateral abdomen and lt lateral low back with relaxation mild compression and side to side mobility. Fascial release completed in mid low and middle to Lt   12/02/2024 Manual: Supine lying with bil LE supported - abdominal scar tissue mobilization focused to Lt side to avoid leaking around ostomy  site Lt sidelying soft tissue mobilization and myofascial release to bil lumbar paraspinals  Exercises: Seated blue band diagonals x10 each way Seated blue band bil shoulder horizontal abduction x10 Standing bil tricep ext blue band 2x10 Ball up wall 10x (yellow ball) Therapeutic activities: Standing bil shoulder extension + green band 2 x 10 Standing bil rows + green band 2 x 10 Forward bent rows 2x10 bil 4# - unilateral hold on parallel bar to help provide support so she can find better core activation 12/08/24: Reviewed HEP updated and goals  Pt supine abdominal scar tissue mobilization focused to Lt side to avoid leaking around ostomy site. Fascial release with direct and indirect techniques based on pt tolerance to improve tissue mobility for decreased back pain and improve core activation for posture.   PATIENT EDUCATION:  Education details: INTERIOR AND SPATIAL DESIGNER Person educated: Patient Education method: Programmer, Multimedia, Facilities Manager, Actor cues, Verbal cues, and Handouts Education comprehension:  verbalized understanding, returned demonstration, verbal cues required, tactile cues required, and needs further education  HOME EXERCISE PROGRAM: Access Code: [email or web address] URL: [email or web address] Date: 12/08/2024 Prepared by: Darryle  Program Notes do abdominal massage daily for 5 mins!  Exercises - Supine Lower Trunk Rotation  - 1 x daily - 7 x weekly - 1 sets - 10 reps - Hooklying Clamshell with Resistance  - 1 x daily - 7 x weekly - 2 sets - 10 reps - pelvic floor contract and holds  - 1 x daily - 7 x weekly - 2 sets - 10 reps - 8-10s holds - fast kegels   - 1 x daily - 7 x weekly - 2 sets - 10 reps - lower ab tightening with breath out  - 1 x daily - 7 x weekly - 2 sets - 10 reps - Seated Shoulder Diagonal Pulls with Resistance  - 1 x daily - 7 x weekly - 2 sets - 10 reps - seated pelvic circles  - 1 x daily - 7 x weekly - 2 sets - 10 reps - Tail Wag  - 1 x daily - 7 x weekly - 1 sets - 10 reps - Hip Hinge Rock Back  - 1 x daily - 7 x weekly - 1 sets - 10 reps - Supine Bridge  - 1 x daily - 7 x weekly - 1 sets - 10 reps - Seated Hip Flexion March with Ankle Weights  - 1 x daily - 7 x weekly - 1 sets - 10 reps - Seated Cat Cow  - 1 x daily - 7 x weekly - 1 sets - 10 reps - Standing Row with Anchored Resistance  - 1 x daily - 7 x weekly - 1 sets - 10 reps - Shoulder External Rotation with Resistance  - 1 x daily - 7 x weekly - 1 sets - 10 reps  ASSESSMENT:  CLINICAL IMPRESSION: Patient is a 74 y.o. female  who was seen today for physical therapy treatment for pelvic, abdominal, back pain, urinary incontinence, and increased urinary frequency. Pt having more pain today after moving boxes over the weekend. We continued manual techniques to abdominal scar tissue and low back restriction with good tolerance and report of 0/10 pain afterwards. She was able to continue strengthening for posterior chain with good tolerance, but did have some increase in Rt shoulder and neck pain.  We performed bent rows in parallel bars for unilateral UE support in order to help her find better activation in her core. She did well with this and  education provided on how these bent exercises will help her pain. Pt very motivated to participate and would would benefit from additional PT to further address deficits.    OBJECTIVE IMPAIRMENTS: decreased activity tolerance, decreased coordination, decreased endurance, decreased mobility, difficulty walking, decreased strength, increased fascial restrictions, increased muscle spasms, impaired flexibility, improper body mechanics, postural dysfunction, and pain.   ACTIVITY LIMITATIONS: carrying, lifting, sitting, standing, transfers, continence, and locomotion level  PARTICIPATION LIMITATIONS: meal prep, cleaning, laundry, interpersonal relationship, community activity, and occupation  PERSONAL FACTORS: Fitness, Time since onset of injury/illness/exacerbation, and 1 comorbidity: medical history are also affecting patient's functional outcome.   REHAB POTENTIAL: Good  CLINICAL DECISION MAKING: Stable/uncomplicated  EVALUATION COMPLEXITY: Low   GOALS: Goals reviewed with patient? Yes  SHORT TERM GOALS: Target date: 07/14/24  Pt to be I with HEP for carry over and continuing recommendations for improved outcomes.   Baseline: Goal status: MET  2.  Pt to be I with pressure management techniques to decreased stress at pelvic floor for improved tolerance to standing and walking without pelvic symptoms.  Baseline:  Goal status: MET  3.  Pt to be I with scar massage for improved abdominal tissue mobility to decreased urinary frequency to better tolerate traveling.  Baseline:  Goal status: MET - I with knowing how to do it but does forget to do it  4.  Pt will be independent with the knack, urge suppression technique, and double voiding in order to improve bladder habits and decrease urinary incontinence for improved skin integrity.  Baseline:   Goal status: MET   LONG TERM GOALS: Target date: 09/16/24  Pt to be I with advanced HEP for carry over and continuing recommendations for improved outcomes.   Baseline:  Goal status: MET  2.  Pt to demonstrate improved coordination of pelvic floor and breathing mechanics with 10# squat with appropriate synergistic patterns to decrease pain and leakage at least 75% of the time for improved ability to complete a 30 minute walking without strain at pelvic floor and symptoms.    Baseline:  Goal status: MET  3. Pt to demonstrate at least 4+/5 bil hip strength for improved pelvic stability and functional squats without leakage.  Baseline:  Goal status: MET 12/08/24  4.  Pt to demonstrate improved core strength with ability to complete sit up test 2/3 without pain or pelvic symptoms.  Baseline:  Goal status: 1/3 not met 12/08/2024 limited with ostomy   5.  Pt to report no more than one instance of urinary incontinence in a day for improved skin integrity and confidence to leave for errands.  Baseline:  Goal status: MET   6.  Pt to report no more than 3/10 pain at hip/pelvic complex due to improved mobility and strength of surrounding structures to tolerate at least 1 hour of standing and walking without worsening pain.  Baseline:  Goal status: MET 12/08/24 reports no longer having this  PLAN:  PT FREQUENCY: 2x/week  PT DURATION: 26 sessions  PLANNED INTERVENTIONS: 97110-Therapeutic exercises, 97530- Therapeutic activity, 97112- Neuromuscular re-education, 97535- Self Care, 02859- Manual therapy, [phone]- Canalith repositioning, [id number]- Aquatic Therapy, [phone]- Electrical stimulation (manual), [id number]- Vasopneumatic device, [id number]- Ultrasound, [id number]- Traction (mechanical), [id number]- Ionotophoresis 4mg /ml Dexamethasone, 79439 (1-2 muscles), 20561 (3+ muscles)- Dry Needling, Patient/Family education, Balance training, Taping, Joint mobilization, Spinal mobilization, Scar mobilization, DME instructions,  Cryotherapy, Moist heat, and Biofeedback  PLAN FOR NEXT SESSION:   PHYSICAL THERAPY DISCHARGE SUMMARY  Visits from Start of Care: 26  Current functional  level related to goals / functional outcomes: All STG met, 5/6 LTG met   Remaining deficits: Reports she does still have back pain but urine, pelvic pain have greatly improved.    Education / Equipment: HEP   Patient agrees to discharge. Patient goals were partially met. Patient is being discharged due to being pleased with the current functional level.    Darryle Navy, PT, DPT 1/12/20264:04 PM  [facility] [address] [place], [place] [address] Phone # [phone] Fax [phone]    "

## 2024-12-09 ENCOUNTER — Other Ambulatory Visit: Payer: Self-pay

## 2024-12-09 ENCOUNTER — Ambulatory Visit: Attending: Family Medicine | Admitting: Rehabilitative and Restorative Service Providers"

## 2024-12-09 ENCOUNTER — Encounter: Payer: Self-pay | Admitting: Rehabilitative and Restorative Service Providers"

## 2024-12-09 DIAGNOSIS — M542 Cervicalgia: Secondary | ICD-10-CM | POA: Insufficient documentation

## 2024-12-09 DIAGNOSIS — M2550 Pain in unspecified joint: Secondary | ICD-10-CM | POA: Diagnosis present

## 2024-12-09 DIAGNOSIS — R279 Unspecified lack of coordination: Secondary | ICD-10-CM

## 2024-12-09 DIAGNOSIS — M25512 Pain in left shoulder: Secondary | ICD-10-CM | POA: Diagnosis not present

## 2024-12-09 DIAGNOSIS — R293 Abnormal posture: Secondary | ICD-10-CM

## 2024-12-09 DIAGNOSIS — M6281 Muscle weakness (generalized): Secondary | ICD-10-CM

## 2024-12-09 DIAGNOSIS — M62838 Other muscle spasm: Secondary | ICD-10-CM

## 2024-12-16 ENCOUNTER — Ambulatory Visit: Admitting: Behavioral Health

## 2024-12-17 ENCOUNTER — Ambulatory Visit (HOSPITAL_BASED_OUTPATIENT_CLINIC_OR_DEPARTMENT_OTHER)
Admission: RE | Admit: 2024-12-17 | Discharge: 2024-12-17 | Disposition: A | Discharge status: Home / Self Care | Source: Ambulatory Visit

## 2024-12-17 ENCOUNTER — Ambulatory Visit

## 2024-12-17 VITALS — BP 128/80 | Ht <= 58 in | Wt 154.0 lb

## 2024-12-17 DIAGNOSIS — M79645 Pain in left finger(s): Secondary | ICD-10-CM | POA: Diagnosis not present

## 2024-12-17 DIAGNOSIS — M25562 Pain in left knee: Secondary | ICD-10-CM | POA: Insufficient documentation

## 2024-12-17 DIAGNOSIS — M79641 Pain in right hand: Secondary | ICD-10-CM

## 2024-12-17 DIAGNOSIS — G8929 Other chronic pain: Secondary | ICD-10-CM | POA: Insufficient documentation

## 2024-12-17 DIAGNOSIS — M8788 Other osteonecrosis, other site: Secondary | ICD-10-CM | POA: Diagnosis not present

## 2024-12-17 NOTE — Progress Notes (Addendum)
 "  Subjective:    Patient ID: Cindy Dudley, female    DOB: 74 y.o., 05-31-1951   MRN: [id number]  Chief Complaint: Right hand pain, left knee pain  History of Present Illness 74 y/o R hand dominant female with past medical history significant for meningioma presenting for evaluation of right hand pain and left knee pain.  Right Hand and Finger Pain: - Persistent pain for several months, maximal across the hand and into the index finger - Occasional radiation of pain to the wrist - Exacerbated by pressing, twisting, making a tight fist, and stretching the finger - Presence of a hand nodule without triggering or locking - No numbness or paresthesia - No history of hand surgery - Engaged in lifting and unpacking boxes for several months - Prior hand radiographs were normal - Laboratory studies negative for rheumatoid arthritis  Right Thumb Pain: - Mild pain, less severe than pain in the index and middle fingers - Full range of motion - Pain occurs only with certain movements  Left Knee Pain and Effusion: - Pain present for approximately 6 months - Onset after experiencing popping in the knee during physical therapy - Morning pain is most severe, causing a brief limp that resolves quickly - Only one physical therapy evaluation visit completed - No prior knee surgery - No history of alcoholism, sickle cell disease, autoimmune disease. - Did receive oral corticosteroids for a 26-month period many years ago.   Review of Pertinent Imaging: 1 view plain film radiograph of the bilateral hands obtained on 12/04/2024 per my independent evaluation revealing no acute osseous abnormalities, negative ulnar variance bilaterally, mild degenerative changes of the carpal bones.  Mild osteopenia.    Objective:   Vitals:   12/17/24 1322  BP: 128/80   Right hand/Fingers/Wrist ( compared to normal ) -Inspection: no swelling, erythema, ecchymoses. No bony deformity or atrophy of the hypothenar  region -Palpation: TTP - DIP, - PIP, + MCP (2nd and 3rd),  - metacarpals, - scaphoid/snuff box, - scaphoid tubercle -AROM/PROM: Full flexion, extension, supination, pronation of the wrist. Full flexion and extension of the fingers, with and without isolation of the DIP. -Strength: 5/5 flexion, 5/5 extension (radial), 5/5 pronation, 5/5 supination, 5/5 finger abduction (ulnar), 5/5 Ok sign (median) -sensation: intact on dorsum of hand (radial), 4th/5th digits (ulnar), 1st-3rd digits (median) -Special tests: - Finklestein, - Eichoff, - Tinel at wrist, - Durkin compression test, - CMC grind, - TFCC grind, - fovea sign   Left Knee (compared to normal) -Inspection: Minimal swelling.  Mild effusion.  No overlying skin changes. -Palpation: TTP + quad tendon, + patella, + patellar tendon, - tibial tuberosity, + pes bursa, + gerdy tubercle, +++ medial joint line, ++ lateral joint line, + posterior knee, + medial and lateral hamstrings.  moderate crepitus with flexion/extension. -AROM/PROM: 3 degrees extension, 130 degrees flexion, low hamstring flexibility -Strength: unable to perform single leg squat, 5/5 flexion, 5/5 extension -Special tests:    -ACL: - lachman, - lever test   -MCL: stable but painful with valgus at 0/30 degrees   -LCL: stable and painless with varus at 0/30 degrees   -PCL: - sag sign   -Meniscus: + McMurray   -Patellofemoral: + patellar grind  4 view plain film radiographs obtained of the left knee today per my independent evaluation revealing osteopenia. Serpiginous patchy opacities present in the distal femoral condyles and to a lesser extent in the proximal tibia. This is consistent with avascular necrosis.    Assessment & Plan:  Assessment & Plan Right hand and finger sprain She experiences subacute pain in her right hand and fingers, particularly in two fingers, consistent with tendon strain and joint sprain. There is no triggering or locking, and x-rays are unremarkable.  Laboratory studies are negative for rheumatoid arthritis. Symptoms are likely worsened by repetitive lifting and packing. A mild nodule is present without triggering. Rest and protection of the affected fingers are recommended. Buddy taping was demonstrated and provided for the two most affected fingers. A palmar splint was offered for additional immobilization, though it may be bulky and uncomfortable. She should avoid further lifting and packing to prevent delayed healing or symptom exacerbation. Topical Voltaren gel is recommended up to four times daily on the dorsal aspect of the affected joints. Hand-specific physical therapy or exercises are deferred until pain improves with rest. A follow-up is planned in 7-10 days to reassess symptoms and healing.  Left knee pain with effusion She has chronic left knee pain with recent onset of pain and popping, mild effusion on examination, and pain with range of motion and palpation.  The x-rays today with findings concerning for avascular necrosis.  Will follow-up this with MRI to further characterize extent of involvement.  No clear risk factors for this apart from a 14-month stint of oral steroids she underwent when she was younger.  Follow-up results regarding further management. low threshold for referral for knee replacement.  Left thumb pain She experiences mild left thumb pain, less severe than her right hand and finger symptoms. Conservative management is recommended, including rest, protection, and topical Voltaren gel as needed. Reassessment is planned at follow-up in 7-10 days.  Greater than 40 minutes were spent in face-to-face discussion with the patient regarding the onset of her symptoms, evaluating her with a physical exam, interpreting her x-ray results, explaining her x-ray findings to her, discussing next steps for treatment based on all of this, and documenting the aforementioned on same day of service. "

## 2024-12-18 ENCOUNTER — Encounter: Payer: Self-pay | Admitting: Rehabilitative and Restorative Service Providers"

## 2024-12-18 ENCOUNTER — Ambulatory Visit: Admitting: Rehabilitative and Restorative Service Providers"

## 2024-12-18 DIAGNOSIS — M62838 Other muscle spasm: Secondary | ICD-10-CM

## 2024-12-18 DIAGNOSIS — R293 Abnormal posture: Secondary | ICD-10-CM

## 2024-12-18 DIAGNOSIS — R279 Unspecified lack of coordination: Secondary | ICD-10-CM

## 2024-12-18 DIAGNOSIS — M6281 Muscle weakness (generalized): Secondary | ICD-10-CM | POA: Diagnosis not present

## 2024-12-18 DIAGNOSIS — M542 Cervicalgia: Secondary | ICD-10-CM

## 2024-12-18 NOTE — Therapy (Signed)
 " OUTPATIENT PHYSICAL THERAPY ORTHO TREATMENT NOTE   Patient Name: Cindy Dudley MRN: [id number] DOB:14-Apr-1951, 74 y.o., female Today's Date: 12/18/2024  END OF SESSION:  PT End of Session - 12/18/24 1230     Visit Number 2    Date for Recertification  01/30/25    Authorization Type [employer] medicare -approved 12/09/2024 - 02/03/2025    Authorization Time Period 12/09/2024 - 02/03/2025    Authorization - Visit Number 2    Authorization - Number of Visits 16    Progress Note Due on Visit 37    PT Start Time 1227    PT Stop Time 1305    PT Time Calculation (min) 38 min    Activity Tolerance Patient tolerated treatment well    Behavior During Therapy WFL for tasks assessed/performed          Past Medical History:  Diagnosis Date   Anemia    Asthma    Blood transfusion    Brain tumor (HCC) 1995   Bruises easily    Bursitis    left knee   Colitis, ulcerative (HCC) 1969   Fatigue    Fibroid tumor 1982   Hearing loss    Kidney stone    Ovarian cyst 1981   Rash    Sore throat    Spasmodic dysphonia 1982   Spasmodic torticollis 2007   Ulcer    Wears glasses    Past Surgical History:  Procedure Laterality Date   ABDOMINAL HYSTERECTOMY  1982   BRAIN TUMOR EXCISION  1995   CHOLECYSTECTOMY  2016   ILEOSTOMY  1982   LARYNX SURGERY  2000   OVARIAN CYST REMOVAL     Patient Active Problem List   Diagnosis Date Noted   Arthralgia 12/04/2024   Neck pain 12/04/2024   Acute pain of left shoulder 12/04/2024   Bilateral hand pain 12/04/2024   Chronic pain of left knee 12/04/2024   Mid back pain 05/16/2024   Pelvic pain 05/16/2024   Ileostomy stenosis (HCC) 09/28/2023   Irritant contact dermatitis associated with fecal stoma 08/26/2023   Ileostomy care (HCC) 08/26/2023   Titubation 08/07/2022   Bilateral hearing loss 08/07/2022   Benign meningioma of brain (HCC) 09/18/2018   Hereditary essential tremor 09/18/2018   Thyroid  nodule, uninodular, left 10/17/2011    PCP: Antonio Cyndee Jamee JONELLE, DO  REFERRING PROVIDER: Wheeler Harlene CROME, NP  REFERRING DIAG: M25.50 (ICD-10-CM) - Arthralgia, unspecified joint M54.2 (ICD-10-CM) - Neck pain M25.512 (ICD-10-CM) - Acute pain of left shoulder  THERAPY DIAG:  Cervicalgia  Muscle weakness (generalized)  Other muscle spasm  Abnormal posture  Unspecified lack of coordination  Rationale for Evaluation and Treatment: Rehabilitation  ONSET DATE: for years for neck, 3-4 months for the left knee  SUBJECTIVE:  SUBJECTIVE STATEMENT: Patient reports stat that she went to another MD yesterday about her knee and hand.  She was given a radiograph of his knee and diagnosed with Avascular Necrosis and he also gave her a finger splint on her right hand.  Hand dominance: Right  PERTINENT HISTORY:  Essential tremor, brain tumor s/p surgical excision in the 90s, spasmatic dysphonia (had surgical repair) Coccyx pain, pelvic floor myalgia, possibly related to nerve damage (per MD note) or surgery, multiple hernia, ostomy, ABDOMINAL HYSTERECTOMY   PAIN:  Are you having pain? Yes: NPRS scale: 5-8/10 Pain location: neck and knee Pain description: sharp Aggravating factors: cervical rotation Relieving factors: rest/not moving  PRECAUTIONS: None  RED FLAGS: None     WEIGHT BEARING RESTRICTIONS: No  FALLS:  Has patient fallen in last 6 months? No  LIVING ENVIRONMENT: Lives with: lives with their spouse and lives with their daughter Lives in: House/apartment Stairs: one story Has following equipment at home: Single point cane, Environmental Consultant - 2 wheeled, shower chair, and Grab bars  OCCUPATION: Retired  PLOF: Independent and Leisure: reading, shopping  PATIENT GOALS: To have less pain in her hands and have more strength to  open things.  NEXT MD VISIT: Dr Antonio on 03/30/25  OBJECTIVE:  Note: Objective measures were completed at Evaluation unless otherwise noted.  DIAGNOSTIC FINDINGS:  Lumbar Radiograph on 08/24/2024: Impression: 1.  No acute fracture or traumatic malalignment.  2.  Multilevel disc degenerative changes most marked at L4-L5 and L5-S1.  3.  Osteopenia.  4.  Mild levocurvature of the thoracolumbar spine.  5.  Multiple surgical clips projecting over abdomen and pelvis.   Sacral Radiograph on 08/24/2024: IMPRESSION:  No acute fracture or malalignment.  Multiple surgical clips projecting over lower abdomen and pelvis.  Mild osteoarthritic changes of the hip joints bilaterally.   Left knee radiograph on 12/17/2024: IMPRESSION: 1. No acute fracture or dislocation. 2. Avascular necrosis of the distal femur and proximal tibia.  Bilateral hand radiograph on 12/04/2024: IMPRESSION: 1. Mild degenerative arthritis of the left first interphalangeal joint of the thumb.    PATIENT SURVEYS:  Eval:  Quick DASH:  61.36  COGNITION: Overall cognitive status: Within functional limits for tasks assessed  SENSATION: Patient denies numbness and tinglng  POSTURE: rounded shoulders and increased thoracic kyphosis  PALPATION: Slight tenderness to palpation along cervical and thoracic region   CERVICAL ROM:   Active ROM A/ROM (deg) eval  Flexion 55*  Extension 45  Right lateral flexion 20*  Left lateral flexion 35  Right rotation 52*  Left rotation 40*   (Blank rows = not tested)  UPPER EXTREMITY ROM:  Active ROM Right eval Left eval  Shoulder flexion 145 122  Shoulder extension    Shoulder abduction 160 142  Shoulder adduction    Shoulder extension     (Blank rows = not tested)  UPPER/LOWER EXTREMITY MMT:  MMT Right eval Left eval  Shoulder flexion 4-/5 4  Shoulder extension    Shoulder abduction 4-/5 4  Shoulder adduction    Shoulder extension    Shoulder internal rotation  5 5/5  Shoulder external rotation 4- 4/5          Elbow flexion 5 5  Elbow extension 4-/5* 4/5  Knee Extension 5 5  Knee Flexion 4 4      Grip strength 24 24   (Blank rows = not tested)   FUNCTIONAL TESTS:  Eval: 5 times sit to stand: 9.22 sec Timed Up and Go (TUG):  7.79 sec  TREATMENT DATE:  12/18/2024: Nustep (LE only secondary to recent finger splints) level 5 x6 min with PT present to discuss status Sit to/from stand 2x10 Seated scapular retraction 2x10 (with verbal and tactile cuing) Seated cervical retraction 2x10 Standing marching with alterate overhead punch 2x10 Standing shoulder flexion with towel x10 bilat Standing shoulder scaption with towel x10 bilat Seated long arc with 2# ankle weights with alt shoulder flexion 2x10 Standing with 2# ankle weights walking jumping jack motion with side step (no jumping) 2x10 Seated hamstring stretch 2x20 sec bilat Standing at wall open book x10 bilat   12/09/2024: Reviewed role of PT Reviewed HEP provided   PATIENT EDUCATION:  Education details: Issued HEP Person educated: Patient Education method: Explanation, Demonstration, and Handouts Education comprehension: verbalized understanding  HOME EXERCISE PROGRAM: Access Code: [email or web address] URL: [email or web address] Date: 12/09/2024 Prepared by: Jarrell Marlowe Lawes  Exercises - Seated Scapular Retraction  - 1 x daily - 7 x weekly - 2 sets - 10 reps - Seated Cervical Retraction  - 1 x daily - 7 x weekly - 2 sets - 10 reps - Shoulder Flexion Wall Slide with Towel (Mirrored)  - 1 x daily - 7 x weekly - 2 sets - 10 reps - Shoulder Scaption Wall Slide with Towel  - 1 x daily - 7 x weekly - 2 sets - 10 reps - Sidelying Thoracic Rotation with Open Book  - 1 x daily - 7 x weekly - 1-2 sets - 10 reps - Supine Chin Tuck  - 1 x daily - 7 x weekly - 2 sets - 10 reps  ASSESSMENT:  CLINICAL IMPRESSION:  Ms Kham presents to skilled PT reporting that she is having some  knee pain today and that her MD is going to try to order a MRI.  Patient is not able to participate in strengthening with her right hand today secondary to splint being in place.  She required cuing with scapular retraction to avoid shoulder shrugging.  Patient was able to incorporate exercises for upper and lower body today, as well as progressing with coordination/balance.  Patient tolerated these exercises well and updated her HEP to incorporate these new exercises.  Patient continues to require skilled PT to progress towards goal related activities.  OBJECTIVE IMPAIRMENTS: decreased mobility, decreased ROM, decreased strength, increased muscle spasms, impaired flexibility, impaired tone, impaired UE functional use, improper body mechanics, postural dysfunction, and pain.   ACTIVITY LIMITATIONS: carrying, lifting, bending, squatting, sleeping, reach over head, and hygiene/grooming  PARTICIPATION LIMITATIONS: meal prep, cleaning, laundry, driving, shopping, and community activity  PERSONAL FACTORS: Age, Past/current experiences, Time since onset of injury/illness/exacerbation, and 3+ comorbidities: OA, essential tremor, Hx of brain tumor are also affecting patient's functional outcome.   REHAB POTENTIAL: Good  CLINICAL DECISION MAKING: Evolving/moderate complexity  EVALUATION COMPLEXITY: Moderate   GOALS: Goals reviewed with patient? Yes  SHORT TERM GOALS: Target date: 01/02/2025  Patient will be independent with initial HEP. Baseline:  Goal status: Ongoing  2.  Increase left shoulder flexion by at least 10 degrees to allow patient to more easily reach into overhead cabinets. Baseline:  Goal status: Ongoing    LONG TERM GOALS: Target date: 01/30/2025  Patient will be independent with advanced HEP to allow for self progression after discharge. Baseline:  Goal status: INITIAL  2.  Patient will increase cervical A/ROM to [facility] to allow her to look over her shoulder without increased  pain. Baseline:  Goal status: INITIAL  3.  Patient will increase  shoulder A/ROM to [facility] to allow her to wash her back without difficulty. Baseline:  Goal status: INITIAL  4.  Patient will improve Quick DASH to no greater than 40 to demonstrate improvements in functional mobility/tasks. Baseline: 61.36 Goal status: INITIAL  5.  Patient will increase grip strength to [facility] to allow her to open more objects with less pain. Baseline:  Goal status: INITIAL    PLAN:  PT FREQUENCY: 1-2x/week  PT DURATION: 8 weeks  PLANNED INTERVENTIONS: 97164- PT Re-evaluation, 97750- Physical Performance Testing, 97110-Therapeutic exercises, 97530- Therapeutic activity, 97112- Neuromuscular re-education, 97535- Self Care, 02859- Manual therapy, [phone]- Canalith repositioning, [id number]- Aquatic Therapy, [phone]- Electrical stimulation (unattended), [phone]- Electrical stimulation (manual), [id number]- Vasopneumatic device, [id number]- Ultrasound, [id number]- Traction (mechanical), [id number]- Ionotophoresis 4mg /ml Dexamethasone, 79439 (1-2 muscles), 20561 (3+ muscles)- Dry Needling, Patient/Family education, Balance training, Taping, Joint mobilization, Joint manipulation, Spinal manipulation, Spinal mobilization, Cryotherapy, and Moist heat  PLAN FOR NEXT SESSION: Assess and progress HEP as indicated, strengthening, flexibility, manual/dry needling as indicated    Jarrell Laming, PT, DPT 12/18/24, 1:37 PM  [facility] [address] [place], [place] [address] Phone # [phone] Fax [phone]  "

## 2024-12-23 ENCOUNTER — Ambulatory Visit: Admitting: Rehabilitative and Restorative Service Providers"

## 2024-12-24 ENCOUNTER — Ambulatory Visit: Payer: Self-pay

## 2024-12-24 ENCOUNTER — Ambulatory Visit (HOSPITAL_BASED_OUTPATIENT_CLINIC_OR_DEPARTMENT_OTHER)
Admission: RE | Admit: 2024-12-24 | Discharge: 2024-12-24 | Disposition: A | Discharge status: Home / Self Care | Source: Ambulatory Visit

## 2024-12-24 ENCOUNTER — Ambulatory Visit

## 2024-12-24 VITALS — BP 120/78 | Ht <= 58 in | Wt 154.0 lb

## 2024-12-24 DIAGNOSIS — G8929 Other chronic pain: Secondary | ICD-10-CM

## 2024-12-24 DIAGNOSIS — M25561 Pain in right knee: Secondary | ICD-10-CM | POA: Diagnosis present

## 2024-12-24 DIAGNOSIS — M25551 Pain in right hip: Secondary | ICD-10-CM | POA: Diagnosis present

## 2024-12-24 DIAGNOSIS — M65331 Trigger finger, right middle finger: Secondary | ICD-10-CM

## 2024-12-24 MED ORDER — METHYLPREDNISOLONE ACETATE 40 MG/ML IJ SUSP
40.0000 mg | Freq: Once | INTRAMUSCULAR | Status: AC
  Administered 2024-12-24: 20 mg via INTRA_ARTICULAR

## 2024-12-24 NOTE — Addendum Note (Signed)
 Addended by: CHARLES ROGUE A on: 12/24/2024 03:45 PM   Modules accepted: Orders

## 2024-12-24 NOTE — Progress Notes (Addendum)
 "  Subjective:    Patient ID: Cindy Dudley, female    DOB: 74 y.o., 12/21/1950   MRN: [id number]  Chief Complaint: Left thumb pain, right hand and finger pain (7-10-day follow-up)  History of Present Illness Cindy Dudley reports continued pain in her middle finger, though pain in her index finger of her right hand has resolved.  Pain in the left thumb is also less bothersome to her today.  No numbness or tingling with these.  Also reporting some pain in her right hip.  Would like to obtain x-rays today if possible.  No known inciting injury.  Has done physical therapy for this which got better for a while but then her back started hurting so she stopped working hip and subsequently has recurred  Also complaining of pain in her right knee.  States that this has started to feel similar to the pain she experienced in her left knee previously.  No known inciting injury.     Objective:   Vitals:   12/24/24 1058  BP: 120/78   Right hand: Tenderness to palpation which is fairly exquisite over the palmar aspect of the MCP joint in the region of the A1 pulley. There is a palpable nodule which slides underneath the A1 pulley but does not fully trigger (partially because the patient is unable to make a full fist due to stiffness that is in the third digit from her splinting follow-up last visit 1 week ago)  Right knee: Tenderness to palpation along the medial femoral condyle.  Mild to moderate tenderness to palpation along the medial joint line.  Minimal tenderness to palpation along the joint line.  Right hip: Tenderness to palpation of the greater trochanter. Negative FADIR, negative FABER, negative axial load, negative logroll 5/5 strength with flexion/extension  Right third finger trigger Finger Injection Procedure Note Cindy Dudley 12/19/1950 Indications: Pain Procedure Details Verbal consent was obtained. Risks (including potential risk for skin lightening and potential atrophy), benefits and  alternatives were discussed. The right 3rd  A1 pulley of the flexor digitorum tendons was identified. Prepped with Chloraprep and Ethyl Chloride used for anesthesia. Under sterile conditions, patient injected with 20mg  Depo-Medrol , 0.5 cc of Mepivacaine 2%, and 0.25 cc of Sodium Bicarbonate 8.4% at the A1 pulley as visualized using the palmar crease aiming distally with 45 degree angle towards nodule; injected directly into tendon sheath. Medication flowed freely without resistance.   Assessment & Plan:   Assessment & Plan Cindy Dudley continues to exhibit pain on the palmar aspect of her third digit at the MCP joint.  Given that there is a palpable nodule in this region for the A1 pulley resides, I feel that this is likely related to some irritation of the tendon/pulley complex.  Provided her with a trigger finger injection at this site and Band-Aid splinting.  Recommend follow-up in 7-10 days to reassess.  Could consider referral to hand therapy specifically +/- hand surgeon if pain continues.  She is also complaining of pain in her right knee which feels similar to the pain she had in her left knee prior to her diagnosis of avascular necrosis.  I believe to be reasonable based on the tenderness that she has directly over the medial femoral condyle to obtain x-rays today and potentially pursue MRI at the same time as her planned left knee MRI.  Regarding her pain in her right hip, she does have tenderness over the greater trochanter which does suggest greater trochanteric pain syndrome, however a safe place to begin  would be with x-rays which she is also in accordance with the patient's preference given a concern that avascular necrosis could also be affecting her hip.  Based on my evaluation of the low suspicion for this, but we will obtain x-rays to reassure.  The patient also expresses to be interested in discussing issues that she has been having chronically with her left shoulder and her neck.  We will  table these for a future visit.  Addendum: Severe evidence of avascular necrosis present in the right knee.  Will obtain MRI to confirm extent.  No appreciable evidence of avascular necrosis in the right hip. "

## 2024-12-26 ENCOUNTER — Ambulatory Visit: Admitting: Rehabilitative and Restorative Service Providers"

## 2024-12-30 ENCOUNTER — Ambulatory Visit: Admitting: Behavioral Health

## 2024-12-30 ENCOUNTER — Ambulatory Visit: Admitting: Rehabilitative and Restorative Service Providers"

## 2024-12-30 ENCOUNTER — Encounter: Payer: Self-pay | Admitting: Rehabilitative and Restorative Service Providers"

## 2024-12-30 ENCOUNTER — Ambulatory Visit (HOSPITAL_BASED_OUTPATIENT_CLINIC_OR_DEPARTMENT_OTHER): Admission: RE | Admit: 2024-12-30 | Discharge: 2024-12-30 | Disposition: A | Source: Ambulatory Visit

## 2024-12-30 ENCOUNTER — Ambulatory Visit (HOSPITAL_BASED_OUTPATIENT_CLINIC_OR_DEPARTMENT_OTHER)

## 2024-12-30 DIAGNOSIS — M62838 Other muscle spasm: Secondary | ICD-10-CM

## 2024-12-30 DIAGNOSIS — R293 Abnormal posture: Secondary | ICD-10-CM

## 2024-12-30 DIAGNOSIS — G8929 Other chronic pain: Secondary | ICD-10-CM

## 2024-12-30 DIAGNOSIS — M6281 Muscle weakness (generalized): Secondary | ICD-10-CM

## 2024-12-30 DIAGNOSIS — R279 Unspecified lack of coordination: Secondary | ICD-10-CM

## 2024-12-30 DIAGNOSIS — M542 Cervicalgia: Secondary | ICD-10-CM

## 2025-01-01 ENCOUNTER — Encounter: Payer: Self-pay | Admitting: Physical Therapy

## 2025-01-01 ENCOUNTER — Telehealth

## 2025-01-01 ENCOUNTER — Ambulatory Visit: Admitting: Physical Therapy

## 2025-01-01 DIAGNOSIS — R293 Abnormal posture: Secondary | ICD-10-CM

## 2025-01-01 DIAGNOSIS — R279 Unspecified lack of coordination: Secondary | ICD-10-CM

## 2025-01-01 DIAGNOSIS — M6281 Muscle weakness (generalized): Secondary | ICD-10-CM

## 2025-01-01 DIAGNOSIS — M62838 Other muscle spasm: Secondary | ICD-10-CM

## 2025-01-01 DIAGNOSIS — M542 Cervicalgia: Secondary | ICD-10-CM

## 2025-01-01 DIAGNOSIS — M8700 Idiopathic aseptic necrosis of unspecified bone: Secondary | ICD-10-CM

## 2025-01-01 NOTE — Therapy (Signed)
 " OUTPATIENT PHYSICAL THERAPY TREATMENT NOTE   Patient Name: Cindy Dudley MRN: [id number] DOB:Mar 30, 1951, 74 y.o., female Today's Date: 01/01/2025  END OF SESSION:  PT End of Session - 01/01/25 1138     Visit Number 4    Date for Recertification  01/30/25    Authorization Type [employer] medicare -approved 12/09/2024 - 02/03/2025    Authorization Time Period 12/09/2024 - 02/03/2025    Authorization - Visit Number 4    Authorization - Number of Visits 16    Progress Note Due on Visit 10    PT Start Time 1145    PT Stop Time 1228    PT Time Calculation (min) 43 min    Activity Tolerance Patient tolerated treatment well    Behavior During Therapy WFL for tasks assessed/performed            Past Medical History:  Diagnosis Date   Anemia    Asthma    Blood transfusion    Brain tumor (HCC) 1995   Bruises easily    Bursitis    left knee   Colitis, ulcerative (HCC) 1969   Fatigue    Fibroid tumor 1982   Hearing loss    Kidney stone    Ovarian cyst 1981   Rash    Sore throat    Spasmodic dysphonia 1982   Spasmodic torticollis 2007   Ulcer    Wears glasses    Past Surgical History:  Procedure Laterality Date   ABDOMINAL HYSTERECTOMY  1982   BRAIN TUMOR EXCISION  1995   CHOLECYSTECTOMY  2016   ILEOSTOMY  1982   LARYNX SURGERY  2000   OVARIAN CYST REMOVAL     Patient Active Problem List   Diagnosis Date Noted   Arthralgia 12/04/2024   Neck pain 12/04/2024   Acute pain of left shoulder 12/04/2024   Bilateral hand pain 12/04/2024   Chronic pain of left knee 12/04/2024   Mid back pain 05/16/2024   Pelvic pain 05/16/2024   Ileostomy stenosis (HCC) 09/28/2023   Irritant contact dermatitis associated with fecal stoma 08/26/2023   Ileostomy care (HCC) 08/26/2023   Titubation 08/07/2022   Bilateral hearing loss 08/07/2022   Benign meningioma of brain (HCC) 09/18/2018   Hereditary essential tremor 09/18/2018   Thyroid  nodule, uninodular, left 10/17/2011    PCP: Antonio Cyndee Jamee JONELLE, DO  REFERRING PROVIDER: Wheeler Harlene CROME, NP  REFERRING DIAG: M25.50 (ICD-10-CM) - Arthralgia, unspecified joint M54.2 (ICD-10-CM) - Neck pain M25.512 (ICD-10-CM) - Acute pain of left shoulder  THERAPY DIAG:  Cervicalgia  Muscle weakness (generalized)  Other muscle spasm  Abnormal posture  Unspecified lack of coordination  Rationale for Evaluation and Treatment: Rehabilitation  ONSET DATE: for years for neck, 3-4 months for the left knee  SUBJECTIVE:  SUBJECTIVE STATEMENT: I went to the chiropractor yesterday and bought a special pillow. My neck is better today. Got her MRI results today and MD is supposed to call her to discuss today as well.   Hand dominance: Right  PERTINENT HISTORY:  Essential tremor, brain tumor s/p surgical excision in the 90s, spasmatic dysphonia (had surgical repair) Coccyx pain, pelvic floor myalgia, possibly related to nerve damage (per MD note) or surgery, multiple hernia, ostomy, ABDOMINAL HYSTERECTOMY   PAIN:  01/01/2025  Are you having pain? Yes: NPRS scale: 7/10 Pain location: neck and knee Pain description: sharp Aggravating factors: cervical rotation Relieving factors: rest/not moving  PRECAUTIONS: None  RED FLAGS: None     WEIGHT BEARING RESTRICTIONS: No  FALLS:  Has patient fallen in last 6 months? No  LIVING ENVIRONMENT: Lives with: lives with their spouse and lives with their daughter Lives in: House/apartment Stairs: one story Has following equipment at home: Single point cane, Environmental Consultant - 2 wheeled, shower chair, and Grab bars  OCCUPATION: Retired  PLOF: Independent and Leisure: reading, shopping  PATIENT GOALS: To have less pain in her hands and have more strength to open things.  NEXT MD VISIT: Dr  Antonio on 03/30/25  OBJECTIVE:  Note: Objective measures were completed at Evaluation unless otherwise noted.  DIAGNOSTIC FINDINGS:  Lumbar Radiograph on 08/24/2024: Impression: 1.  No acute fracture or traumatic malalignment.  2.  Multilevel disc degenerative changes most marked at L4-L5 and L5-S1.  3.  Osteopenia.  4.  Mild levocurvature of the thoracolumbar spine.  5.  Multiple surgical clips projecting over abdomen and pelvis.   Sacral Radiograph on 08/24/2024: IMPRESSION:  No acute fracture or malalignment.  Multiple surgical clips projecting over lower abdomen and pelvis.  Mild osteoarthritic changes of the hip joints bilaterally.   Left knee radiograph on 12/17/2024: IMPRESSION: 1. No acute fracture or dislocation. 2. Avascular necrosis of the distal femur and proximal tibia.  Bilateral hand radiograph on 12/04/2024: IMPRESSION: 1. Mild degenerative arthritis of the left first interphalangeal joint of the thumb.  Right Knee radiograph 12/24/2024: IMPRESSION: 1. No acute findings. 2. Sclerotic foci in the distal femur and proximal tibia, which are favored represent avascular necrosis    PATIENT SURVEYS:  Eval:  Quick DASH:  61.36  COGNITION: Overall cognitive status: Within functional limits for tasks assessed  SENSATION: Patient denies numbness and tinglng  POSTURE: rounded shoulders and increased thoracic kyphosis  PALPATION: Slight tenderness to palpation along cervical and thoracic region   CERVICAL ROM:   Active ROM A/ROM (deg) eval  Flexion 55*  Extension 45  Right lateral flexion 20*  Left lateral flexion 35  Right rotation 52*  Left rotation 40*   (Blank rows = not tested)  UPPER EXTREMITY ROM:  Active ROM Right eval Left eval  Shoulder flexion 145 122  Shoulder extension    Shoulder abduction 160 142  Shoulder adduction    Shoulder extension     (Blank rows = not tested)  UPPER/LOWER EXTREMITY MMT:  MMT Right eval Left eval   Shoulder flexion 4-/5 4  Shoulder extension    Shoulder abduction 4-/5 4  Shoulder adduction    Shoulder extension    Shoulder internal rotation 5 5/5  Shoulder external rotation 4- 4/5          Elbow flexion 5 5  Elbow extension 4-/5* 4/5  Knee Extension 5 5  Knee Flexion 4 4      Grip strength 24 24   (Blank rows =  not tested)   FUNCTIONAL TESTS:  Eval: 5 times sit to stand: 9.22 sec Timed Up and Go (TUG):  7.79 sec  TREATMENT DATE:  01/01/2025: Nustep (LE only secondary to no use of right UE) level 3 x6 min with PT present to discuss status Seated hamstring stretch 2x30 sec bilat Seated scapular retraction 2x10  Seated sidebending stretch x 30 sec bilat Seated cervical retraction with purple ball 2x10 (min cueing required for proper technique) Seated marches 1x10 then with 2# x 10 ea Standing shoulder flexion, scaption and ABD with 2# AW on wrists  2x10 bilat Standing biceps curl to [place] press bilat with 2# AW on wrists Standing low trap lift offs with 2# AW on wrists  2x10 Standing wall open books x10 bilat (cued to keep shoulders down) Standing with 2# ankle weights walking jumping jack motion with side step (no jumping) 2x10 Standing row red band 2x10 L only Standing shoulder ext red band 2x10 L only today Standing L shoulder ER and IR x 10 ea L only  Standing in door pec stretch x30 sec ea (changed to one arm at a time because left side was hurting posterior shoulder.   12/30/2024: Nustep (LE only secondary to no use of right UE) level 3 x6 min with PT present to discuss status Seated hamstring stretch 2x30 sec bilat Seated scapular retraction 2x10 (with verbal and tactile cuing) Seated sidebending stretch 3x10 bilat Seated cervical retraction with purple ball 2x10 (cueing required for proper technique) Seated marches 2x10  Seated marching with alterate overhead punch 2x10 Standing shoulder flexion with towel against wall 2x10 bilat Standing low trap lift offs  2x10 Standing wall open books x10 bilat (cued to keep shoulders down) Standing with 2# ankle weights walking jumping jack motion with side step (no jumping) 2x10 Patient education on proper sitting posture Standing in door pec stretch 2x20 sec bilat   12/18/2024: Nustep (LE only secondary to recent finger splints) level 5 x6 min with PT present to discuss status Sit to/from stand 2x10 Seated scapular retraction 2x10 (with verbal and tactile cuing) Seated cervical retraction 2x10 Standing marching with alterate overhead punch 2x10 Standing shoulder flexion with towel x10 bilat Standing shoulder scaption with towel x10 bilat Seated long arc with 2# ankle weights with alt shoulder flexion 2x10 Standing with 2# ankle weights walking jumping jack motion with side step (no jumping) 2x10 Seated hamstring stretch 2x20 sec bilat Standing at wall open book x10 bilat   12/09/2024: Reviewed role of PT Reviewed HEP provided   PATIENT EDUCATION:  Education details: Issued HEP Person educated: Patient Education method: Explanation, Demonstration, and Handouts Education comprehension: verbalized understanding  HOME EXERCISE PROGRAM: Access Code: [email or web address] URL: [email or web address] Date: 01/01/2025 Prepared by: Mliss  Exercises - Seated Scapular Retraction  - 1 x daily - 7 x weekly - 2 sets - 10 reps - Seated Cervical Retraction  - 1 x daily - 7 x weekly - 2 sets - 10 reps - Shoulder Flexion Wall Slide with Towel (Mirrored)  - 1 x daily - 7 x weekly - 2 sets - 10 reps - Shoulder Scaption Wall Slide with Towel  - 1 x daily - 7 x weekly - 2 sets - 10 reps - Sidelying Thoracic Rotation with Open Book  - 1 x daily - 7 x weekly - 1-2 sets - 10 reps - Supine Chin Tuck  - 1 x daily - 7 x weekly - 2 sets - 10 reps - Walking Jumping [place]  -  1 x daily - 7 x weekly - 2 sets - 10 reps - Standing March with Alternating [facility]  - 1 x daily - 7 x weekly - 2 sets - 10 reps -  Standing Single Arm Low Row with Anchored Resistance  - 1 x daily - 3 x weekly - 2 sets - 10 reps - Single Arm Shoulder Extension with Anchored Resistance  - 1 x daily - 3 x weekly - 2 sets - 10 reps  ASSESSMENT:  CLINICAL IMPRESSION: Malisa reports improvement in neck pain after acquiring a cervical pillow. She still has pain of 7/10, but states it normally is 8/10. She tolerated increase in in resistance to both upper and lower extremity exercises without complaint. Ankle weights on wrists utilized due to R hand injury. Tband was also issued with a loop at the wrist to allow R shoulder strengthening for HEP as well as left side. Good demonstration of scapular retraction with no cues required. Chin tucks required minimal cues.   OBJECTIVE IMPAIRMENTS: decreased mobility, decreased ROM, decreased strength, increased muscle spasms, impaired flexibility, impaired tone, impaired UE functional use, improper body mechanics, postural dysfunction, and pain.   ACTIVITY LIMITATIONS: carrying, lifting, bending, squatting, sleeping, reach over head, and hygiene/grooming  PARTICIPATION LIMITATIONS: meal prep, cleaning, laundry, driving, shopping, and community activity  PERSONAL FACTORS: Age, Past/current experiences, Time since onset of injury/illness/exacerbation, and 3+ comorbidities: OA, essential tremor, Hx of brain tumor are also affecting patient's functional outcome.   REHAB POTENTIAL: Good  CLINICAL DECISION MAKING: Evolving/moderate complexity  EVALUATION COMPLEXITY: Moderate   GOALS: Goals reviewed with patient? Yes  SHORT TERM GOALS: Target date: 01/02/2025  Patient will be independent with initial HEP. Baseline:  Goal status: Ongoing  2.  Increase left shoulder flexion by at least 10 degrees to allow patient to more easily reach into overhead cabinets. Baseline:  Goal status: Ongoing    LONG TERM GOALS: Target date: 01/30/2025  Patient will be independent with advanced HEP to  allow for self progression after discharge. Baseline:  Goal status: INITIAL  2.  Patient will increase cervical A/ROM to [facility] to allow her to look over her shoulder without increased pain. Baseline:  Goal status: INITIAL  3.  Patient will increase shoulder A/ROM to [facility] to allow her to wash her back without difficulty. Baseline:  Goal status: INITIAL  4.  Patient will improve Quick DASH to no greater than 40 to demonstrate improvements in functional mobility/tasks. Baseline: 61.36 Goal status: INITIAL  5.  Patient will increase grip strength to [facility] to allow her to open more objects with less pain. Baseline:  Goal status: INITIAL    PLAN:  PT FREQUENCY: 1-2x/week  PT DURATION: 8 weeks  PLANNED INTERVENTIONS: [id number]- PT Re-evaluation, 97750- Physical Performance Testing, 97110-Therapeutic exercises, 97530- Therapeutic activity, [id number]- Neuromuscular re-education, 97535- Self Care, [id number]- Manual therapy, [phone]- Canalith repositioning, [id number]- Aquatic Therapy, H9716- Electrical stimulation (unattended), [phone]- Electrical stimulation (manual), [id number]- Vasopneumatic device, [id number]- Ultrasound, [id number]- Traction (mechanical), [id number]- Ionotophoresis 4mg /ml Dexamethasone, [id number] (1-2 muscles), [id number] (3+ muscles)- Dry Needling, Patient/Family education, Balance training, Taping, Joint mobilization, Joint manipulation, Spinal manipulation, Spinal mobilization, Cryotherapy, and Moist heat  PLAN FOR NEXT SESSION: Assess and progress HEP as indicated, strengthening, flexibility, manual/dry needling as indicated   Mliss Cummins, PT 01/01/25 12:34 PM   "

## 2025-01-01 NOTE — Progress Notes (Unsigned)
" ° ° °  Patient ID: Cindy Dudley, female    DOB: 74 y.o., [birth year]   MRN: [id number]   Chief Complaint: Video Visit for MRI Result Review  Patient Location: *** Provider Location: [place], [place]    Discussed the use of AI scribe software for clinical note transcription with the patient, who gave verbal consent to proceed.  History of Present Illness      Physical Exam   Const: appears well, non-toxic, well groomed Psych: affect bright, interactive, smiling EENT: EOMI intact, conjunctiva appear normal Neck: no obvious masses, appears symmetric Resp: non-labored, appears symmetric Neuro: muscle bulk appears normal Skin: no obvious rashes noted Assessment & Plan     "

## 2025-01-06 ENCOUNTER — Ambulatory Visit: Admitting: Rehabilitative and Restorative Service Providers"

## 2025-01-07 ENCOUNTER — Ambulatory Visit: Admitting: Physical Therapy

## 2025-01-08 ENCOUNTER — Ambulatory Visit

## 2025-01-13 ENCOUNTER — Ambulatory Visit: Admitting: Rehabilitative and Restorative Service Providers"

## 2025-01-15 ENCOUNTER — Ambulatory Visit: Admitting: Rehabilitative and Restorative Service Providers"

## 2025-01-16 ENCOUNTER — Ambulatory Visit

## 2025-01-19 ENCOUNTER — Ambulatory Visit: Admitting: Rehabilitative and Restorative Service Providers"

## 2025-01-20 ENCOUNTER — Ambulatory Visit: Admitting: Behavioral Health

## 2025-01-21 ENCOUNTER — Ambulatory Visit: Admitting: Physical Therapy

## 2025-01-27 ENCOUNTER — Ambulatory Visit: Admitting: Rehabilitative and Restorative Service Providers"

## 2025-03-30 ENCOUNTER — Encounter: Admitting: Family Medicine
